# Patient Record
Sex: Male | Born: 1950 | Race: White | Hispanic: No | Marital: Married | Smoking: Current every day smoker
Health system: Southern US, Community
[De-identification: ages and names within clinical notes are randomized; demographics above are authoritative.]

## PROBLEM LIST (undated history)

## (undated) DIAGNOSIS — I10 Essential (primary) hypertension: Secondary | ICD-10-CM

## (undated) DIAGNOSIS — N400 Enlarged prostate without lower urinary tract symptoms: Secondary | ICD-10-CM

## (undated) DIAGNOSIS — B3749 Other urogenital candidiasis: Secondary | ICD-10-CM

## (undated) DIAGNOSIS — F03918 Unspecified dementia, unspecified severity, with other behavioral disturbance: Secondary | ICD-10-CM

## (undated) DIAGNOSIS — F0391 Unspecified dementia with behavioral disturbance: Secondary | ICD-10-CM

## (undated) DIAGNOSIS — Z720 Tobacco use: Secondary | ICD-10-CM

## (undated) DIAGNOSIS — M1990 Unspecified osteoarthritis, unspecified site: Secondary | ICD-10-CM

## (undated) DIAGNOSIS — E785 Hyperlipidemia, unspecified: Secondary | ICD-10-CM

## (undated) DIAGNOSIS — G35 Multiple sclerosis: Secondary | ICD-10-CM

## (undated) DIAGNOSIS — I714 Abdominal aortic aneurysm, without rupture, unspecified: Secondary | ICD-10-CM

## (undated) DIAGNOSIS — H543 Unqualified visual loss, both eyes: Secondary | ICD-10-CM

## (undated) DIAGNOSIS — Z8781 Personal history of (healed) traumatic fracture: Secondary | ICD-10-CM

## (undated) DIAGNOSIS — I7090 Unspecified atherosclerosis: Secondary | ICD-10-CM

## (undated) HISTORY — DX: Unspecified osteoarthritis, unspecified site: M19.90

## (undated) HISTORY — DX: Multiple sclerosis: G35

## (undated) HISTORY — DX: Hyperlipidemia, unspecified: E78.5

## (undated) HISTORY — DX: Abdominal aortic aneurysm, without rupture, unspecified: I71.40

## (undated) HISTORY — DX: Unspecified dementia, unspecified severity, with other behavioral disturbance: F03.918

## (undated) HISTORY — PX: OTHER SURGICAL HISTORY: SHX169

## (undated) HISTORY — DX: Unqualified visual loss, both eyes: H54.3

## (undated) HISTORY — DX: Abdominal aortic aneurysm, without rupture: I71.4

## (undated) HISTORY — DX: Other urogenital candidiasis: B37.49

## (undated) HISTORY — DX: Unspecified atherosclerosis: I70.90

## (undated) HISTORY — DX: Unspecified dementia with behavioral disturbance: F03.91

## (undated) HISTORY — DX: Essential (primary) hypertension: I10

## (undated) HISTORY — DX: Personal history of (healed) traumatic fracture: Z87.81

## (undated) HISTORY — DX: Tobacco use: Z72.0

## (undated) HISTORY — DX: Benign prostatic hyperplasia without lower urinary tract symptoms: N40.0

---

## 1972-05-23 HISTORY — PX: OTHER SURGICAL HISTORY: SHX169

## 1996-05-23 HISTORY — PX: OTHER SURGICAL HISTORY: SHX169

## 2005-05-05 ENCOUNTER — Encounter: Admission: RE | Admit: 2005-05-05 | Discharge: 2005-05-05 | Payer: Self-pay | Admitting: *Deleted

## 2011-06-06 DIAGNOSIS — R634 Abnormal weight loss: Secondary | ICD-10-CM | POA: Diagnosis not present

## 2011-06-06 DIAGNOSIS — E785 Hyperlipidemia, unspecified: Secondary | ICD-10-CM | POA: Diagnosis not present

## 2011-06-06 DIAGNOSIS — I10 Essential (primary) hypertension: Secondary | ICD-10-CM | POA: Diagnosis not present

## 2011-06-09 ENCOUNTER — Encounter: Payer: Self-pay | Admitting: Gastroenterology

## 2011-06-09 DIAGNOSIS — Z0000 Encounter for general adult medical examination without abnormal findings: Secondary | ICD-10-CM | POA: Diagnosis not present

## 2011-06-16 ENCOUNTER — Ambulatory Visit (AMBULATORY_SURGERY_CENTER): Payer: Medicare Other | Admitting: *Deleted

## 2011-06-16 VITALS — Ht 66.5 in | Wt 145.1 lb

## 2011-06-16 DIAGNOSIS — Z1211 Encounter for screening for malignant neoplasm of colon: Secondary | ICD-10-CM | POA: Diagnosis not present

## 2011-06-16 MED ORDER — PEG-KCL-NACL-NASULF-NA ASC-C 100 G PO SOLR: ORAL | Status: DC

## 2011-06-16 NOTE — Progress Notes (Signed)
Pt is blind.  Wife is with pt and reviewed all paperwork with pt before pt signed papers.  Ezra Sites

## 2011-06-29 ENCOUNTER — Encounter: Payer: Self-pay | Admitting: Gastroenterology

## 2011-06-29 ENCOUNTER — Ambulatory Visit (AMBULATORY_SURGERY_CENTER): Payer: Medicare Other | Admitting: Gastroenterology

## 2011-06-29 DIAGNOSIS — Z1211 Encounter for screening for malignant neoplasm of colon: Secondary | ICD-10-CM

## 2011-06-29 DIAGNOSIS — D126 Benign neoplasm of colon, unspecified: Secondary | ICD-10-CM

## 2011-06-29 MED ORDER — SODIUM CHLORIDE 0.9 % IV SOLN: 500.0000 mL | INTRAVENOUS | Status: DC

## 2011-06-29 NOTE — Progress Notes (Signed)
Patient did not experience any of the following events: a burn prior to discharge; a fall within the facility; wrong site/side/patient/procedure/implant event; or a hospital transfer or hospital admission upon discharge from the facility. (G8907) Patient did not have preoperative order for IV antibiotic SSI prophylaxis. (G8918)  

## 2011-06-29 NOTE — Patient Instructions (Signed)
Discharge instructions given with verbal understanding.  Handouts on polyps and hemorrhoids given.  Resume previous medications. 

## 2011-06-29 NOTE — Op Note (Signed)
[facility] [address] [place], [place]  [address]  COLONOSCOPY PROCEDURE REPORT  PATIENT:  Joe Dawson, Joe Dawson  MR#:  [id number] BIRTHDATE:  August 04, 1950, 60 yrs. old  GENDER:  male ENDOSCOPIST:  Judie Petit T. Russella Dar, MD, [facility] Referred by:  Bufford Spikes, D. O. PROCEDURE DATE:  06/29/2011 PROCEDURE:  Colonoscopy with snare polypectomy ASA CLASS:  Class II INDICATIONS:  1) Routine Risk Screening MEDICATIONS:   These medications were titrated to patient response per physician's verbal order, Fentanyl 100 mcg IV, Versed 10 mg IV DESCRIPTION OF PROCEDURE:   After the risks benefits and alternatives of the procedure were thoroughly explained, informed consent was obtained.  Digital rectal exam was performed and revealed no abnormalities.   The LB 180AL [id number] endoscope was introduced through the anus and advanced to the cecum, which was identified by both the appendix and ileocecal valve, limited by a tortuous and redundant colon, fair prep.    The quality of the prep was Moviprep fair.  The instrument was then slowly withdrawn as the colon was fully examined. <<PROCEDUREIMAGES>> FINDINGS:  Four polyps were found in the sigmoid colon. They were 5 - 7 mm in size. Polyps were snared without cautery. Retrieval was successful. Otherwise normal colonoscopy without other polyps, masses, vascular ectasias, or inflammatory changes. Retroflexed views in the rectum revealed internal hemorrhoids, moderate.  The time to cecum =  12.25  minutes. The scope was then withdrawn (time =  17.33  min) from the patient and the procedure completed.  COMPLICATIONS:  None  ENDOSCOPIC IMPRESSION: 1) 5 - 7 mm Four polyps in the sigmoid colon 2) Internal hemorrhoids  RECOMMENDATIONS: 1) Await pathology results 2) If 3 or 4 are adenomatous polyps, repeat colonoscopy in 3 years. If 1-2 are adenomatous, repeat colonoscopy in 5 years. Otherwise colonoscopy, VC or BE in 7 years given fair prep, tortuous and  redundant colon with a more extensive bowel prep.  Venita Lick. Russella Dar, MD, [facility]  n. eSIGNED:   Venita Lick. Feather Berrie at 06/29/2011 10:48 AM  Laurena Spies, [phone]

## 2011-06-30 ENCOUNTER — Telehealth: Payer: Self-pay | Admitting: *Deleted

## 2011-06-30 NOTE — Telephone Encounter (Signed)
Pt. Did not answer phone, left message

## 2011-07-04 ENCOUNTER — Encounter: Payer: Self-pay | Admitting: Gastroenterology

## 2011-07-18 ENCOUNTER — Other Ambulatory Visit: Payer: Self-pay | Admitting: Internal Medicine

## 2011-07-18 ENCOUNTER — Ambulatory Visit
Admission: RE | Admit: 2011-07-18 | Discharge: 2011-07-18 | Disposition: A | Discharge status: Home / Self Care | Payer: Medicare Other | Source: Ambulatory Visit | Attending: Internal Medicine | Admitting: Internal Medicine

## 2011-07-18 DIAGNOSIS — M47817 Spondylosis without myelopathy or radiculopathy, lumbosacral region: Secondary | ICD-10-CM | POA: Diagnosis not present

## 2011-07-18 DIAGNOSIS — I10 Essential (primary) hypertension: Secondary | ICD-10-CM | POA: Diagnosis not present

## 2011-07-18 DIAGNOSIS — M169 Osteoarthritis of hip, unspecified: Secondary | ICD-10-CM | POA: Diagnosis not present

## 2011-07-18 DIAGNOSIS — R52 Pain, unspecified: Secondary | ICD-10-CM

## 2011-07-18 DIAGNOSIS — M5137 Other intervertebral disc degeneration, lumbosacral region: Secondary | ICD-10-CM | POA: Diagnosis not present

## 2011-07-18 DIAGNOSIS — M25559 Pain in unspecified hip: Secondary | ICD-10-CM | POA: Diagnosis not present

## 2011-07-18 DIAGNOSIS — IMO0002 Reserved for concepts with insufficient information to code with codable children: Secondary | ICD-10-CM | POA: Diagnosis not present

## 2012-06-07 DIAGNOSIS — E785 Hyperlipidemia, unspecified: Secondary | ICD-10-CM | POA: Diagnosis not present

## 2012-06-07 DIAGNOSIS — I10 Essential (primary) hypertension: Secondary | ICD-10-CM | POA: Diagnosis not present

## 2012-06-07 DIAGNOSIS — F17200 Nicotine dependence, unspecified, uncomplicated: Secondary | ICD-10-CM | POA: Diagnosis not present

## 2012-06-07 DIAGNOSIS — N400 Enlarged prostate without lower urinary tract symptoms: Secondary | ICD-10-CM | POA: Diagnosis not present

## 2012-06-07 DIAGNOSIS — H4720 Unspecified optic atrophy: Secondary | ICD-10-CM | POA: Diagnosis not present

## 2012-06-11 DIAGNOSIS — F17200 Nicotine dependence, unspecified, uncomplicated: Secondary | ICD-10-CM | POA: Diagnosis not present

## 2012-06-11 DIAGNOSIS — E785 Hyperlipidemia, unspecified: Secondary | ICD-10-CM | POA: Diagnosis not present

## 2012-06-11 DIAGNOSIS — I10 Essential (primary) hypertension: Secondary | ICD-10-CM | POA: Diagnosis not present

## 2012-06-11 DIAGNOSIS — N400 Enlarged prostate without lower urinary tract symptoms: Secondary | ICD-10-CM | POA: Diagnosis not present

## 2012-10-24 ENCOUNTER — Other Ambulatory Visit: Payer: Self-pay

## 2013-04-01 ENCOUNTER — Other Ambulatory Visit: Payer: Self-pay | Admitting: Internal Medicine

## 2013-04-01 DIAGNOSIS — H547 Unspecified visual loss: Secondary | ICD-10-CM

## 2013-04-01 DIAGNOSIS — N400 Enlarged prostate without lower urinary tract symptoms: Secondary | ICD-10-CM

## 2013-04-01 DIAGNOSIS — G35 Multiple sclerosis: Secondary | ICD-10-CM

## 2013-04-01 DIAGNOSIS — E559 Vitamin D deficiency, unspecified: Secondary | ICD-10-CM

## 2013-04-01 DIAGNOSIS — E785 Hyperlipidemia, unspecified: Secondary | ICD-10-CM

## 2013-04-01 DIAGNOSIS — R739 Hyperglycemia, unspecified: Secondary | ICD-10-CM

## 2013-08-07 ENCOUNTER — Other Ambulatory Visit: Payer: Medicare Other

## 2013-08-07 DIAGNOSIS — R739 Hyperglycemia, unspecified: Secondary | ICD-10-CM

## 2013-08-07 DIAGNOSIS — E785 Hyperlipidemia, unspecified: Secondary | ICD-10-CM | POA: Diagnosis not present

## 2013-08-07 DIAGNOSIS — R7309 Other abnormal glucose: Secondary | ICD-10-CM | POA: Diagnosis not present

## 2013-08-07 DIAGNOSIS — N400 Enlarged prostate without lower urinary tract symptoms: Secondary | ICD-10-CM | POA: Diagnosis not present

## 2013-08-07 DIAGNOSIS — E559 Vitamin D deficiency, unspecified: Secondary | ICD-10-CM | POA: Diagnosis not present

## 2013-08-07 DIAGNOSIS — G35 Multiple sclerosis: Secondary | ICD-10-CM

## 2013-08-08 LAB — COMPREHENSIVE METABOLIC PANEL
ALT: 19 IU/L (ref 0–44)
AST: 25 IU/L (ref 0–40)
Albumin/Globulin Ratio: 1.8 (ref 1.1–2.5)
Albumin: 4.7 g/dL (ref 3.6–4.8)
Alkaline Phosphatase: 60 IU/L (ref 39–117)
BUN/Creatinine Ratio: 12 (ref 10–22)
BUN: 8 mg/dL (ref 8–27)
CO2: 20 mmol/L (ref 18–29)
Calcium: 9.4 mg/dL (ref 8.6–10.2)
Chloride: 96 mmol/L — ABNORMAL LOW (ref 97–108)
Creatinine, Ser: 0.68 mg/dL — ABNORMAL LOW (ref 0.76–1.27)
GFR calc Af Amer: 117 mL/min/{1.73_m2} (ref >59)
GFR calc non Af Amer: 101 mL/min/{1.73_m2} (ref >59)
Globulin, Total: 2.6 g/dL (ref 1.5–4.5)
Glucose: 96 mg/dL (ref 65–99)
Potassium: 4.2 mmol/L (ref 3.5–5.2)
Sodium: 136 mmol/L (ref 134–144)
Total Bilirubin: 1 mg/dL (ref 0.0–1.2)
Total Protein: 7.3 g/dL (ref 6.0–8.5)

## 2013-08-08 LAB — LIPID PANEL
Chol/HDL Ratio: 3.5 ratio units (ref 0.0–5.0)
Cholesterol, Total: 215 mg/dL — ABNORMAL HIGH (ref 100–199)
HDL: 61 mg/dL (ref >39)
LDL Calculated: 134 mg/dL — ABNORMAL HIGH (ref 0–99)
Triglycerides: 99 mg/dL (ref 0–149)
VLDL Cholesterol Cal: 20 mg/dL (ref 5–40)

## 2013-08-08 LAB — CBC WITH DIFFERENTIAL/PLATELET
Basophils Absolute: 0.1 10*3/uL (ref 0.0–0.2)
Basos: 1 %
Eos: 6 %
Eosinophils Absolute: 0.5 10*3/uL — ABNORMAL HIGH (ref 0.0–0.4)
HCT: 45.1 % (ref 37.5–51.0)
Hemoglobin: 15.5 g/dL (ref 12.6–17.7)
Immature Grans (Abs): 0 10*3/uL (ref 0.0–0.1)
Immature Granulocytes: 0 %
Lymphocytes Absolute: 3.3 10*3/uL — ABNORMAL HIGH (ref 0.7–3.1)
Lymphs: 38 %
MCH: 34.4 pg — ABNORMAL HIGH (ref 26.6–33.0)
MCHC: 34.4 g/dL (ref 31.5–35.7)
MCV: 100 fL — ABNORMAL HIGH (ref 79–97)
Monocytes Absolute: 1 10*3/uL — ABNORMAL HIGH (ref 0.1–0.9)
Monocytes: 12 %
Neutrophils Absolute: 3.7 10*3/uL (ref 1.4–7.0)
Neutrophils Relative %: 43 %
RBC: 4.5 x10E6/uL (ref 4.14–5.80)
RDW: 13.5 % (ref 12.3–15.4)
WBC: 8.6 10*3/uL (ref 3.4–10.8)

## 2013-08-08 LAB — HEMOGLOBIN A1C
Est. average glucose Bld gHb Est-mCnc: 108 mg/dL
Hgb A1c MFr Bld: 5.4 % (ref 4.8–5.6)

## 2013-08-08 LAB — VITAMIN D 25 HYDROXY (VIT D DEFICIENCY, FRACTURES): Vit D, 25-Hydroxy: 23.6 ng/mL — ABNORMAL LOW (ref 30.0–100.0)

## 2013-08-09 ENCOUNTER — Encounter: Payer: Self-pay | Admitting: Internal Medicine

## 2013-08-09 ENCOUNTER — Ambulatory Visit (INDEPENDENT_AMBULATORY_CARE_PROVIDER_SITE_OTHER): Payer: Medicare Other | Admitting: Internal Medicine

## 2013-08-09 VITALS — BP 110/68 | HR 58 | Temp 97.8°F | Resp 10 | Ht 66.5 in | Wt 140.0 lb

## 2013-08-09 DIAGNOSIS — H547 Unspecified visual loss: Secondary | ICD-10-CM

## 2013-08-09 DIAGNOSIS — H543 Unqualified visual loss, both eyes: Secondary | ICD-10-CM

## 2013-08-09 DIAGNOSIS — R739 Hyperglycemia, unspecified: Secondary | ICD-10-CM

## 2013-08-09 DIAGNOSIS — F17200 Nicotine dependence, unspecified, uncomplicated: Secondary | ICD-10-CM

## 2013-08-09 DIAGNOSIS — Z720 Tobacco use: Secondary | ICD-10-CM

## 2013-08-09 DIAGNOSIS — E785 Hyperlipidemia, unspecified: Secondary | ICD-10-CM | POA: Insufficient documentation

## 2013-08-09 DIAGNOSIS — N400 Enlarged prostate without lower urinary tract symptoms: Secondary | ICD-10-CM

## 2013-08-09 DIAGNOSIS — Z7189 Other specified counseling: Secondary | ICD-10-CM

## 2013-08-09 DIAGNOSIS — G35 Multiple sclerosis: Secondary | ICD-10-CM | POA: Insufficient documentation

## 2013-08-09 DIAGNOSIS — E559 Vitamin D deficiency, unspecified: Secondary | ICD-10-CM | POA: Insufficient documentation

## 2013-08-09 DIAGNOSIS — Z716 Tobacco abuse counseling: Secondary | ICD-10-CM

## 2013-08-09 DIAGNOSIS — R7309 Other abnormal glucose: Secondary | ICD-10-CM

## 2013-08-09 MED ORDER — VITAMIN D3 50 MCG (2000 UT) PO TABS: 2000.0000 [IU] | ORAL_TABLET | Freq: Every day | ORAL | Status: DC

## 2013-08-09 NOTE — Progress Notes (Signed)
Patient ID: Joe Dawson, male   DOB: Oct 04, 1950, 63 y.o.   MRN: [id number]   Location:  [facility]   Allergies  Allergen Reactions  . Epinephrine     Legs shake    Chief Complaint  Patient presents with  . Annual Exam    Yearly check-up, discuss labs (copy printed)     HPI: Patient is a 63 y.o. white male who is blind in both eyes and has arthritis was seen in the office today for his annual physical.  He refuses all vaccines.  He is still not ready to quit smoking.  Holding at 1/2 ppd.  Is trying to cut down some.  Has been down to 5 cigarettes at one point.  Knows he does not get enough exercise so he has sore muscles.  Tries to walk and ride his bike daily.    Has to work really hard to control his urine.  Using saw palmetto.  Increases them by 1-2 if it gets worse--takes 2-3 per day anyway.  No dysuria.  Has pressing need to run to the bathroom.  Will think about medication for this--refuses to see me but once a year.    Has lost 5 lbs, but does exercise.  Weighs himself about 2x per month.  Fluctuates b/w 135-140lbs.    Review of Systems:  Review of Systems  Constitutional: Negative for fever, chills and malaise/fatigue.  HENT: Negative for congestion.   Eyes:       Blind  Respiratory: Negative for cough and shortness of breath.   Cardiovascular: Negative for chest pain and palpitations.  Gastrointestinal: Negative for heartburn.  Genitourinary: Negative for dysuria.  Musculoskeletal: Negative for falls and myalgias.  Skin: Negative for rash.  Neurological: Negative for dizziness, loss of consciousness and headaches.       Unsteady gait, uses his guide stick  Endo/Heme/Allergies: Bruises/bleeds easily.  Psychiatric/Behavioral: Negative for depression and memory loss.    Past Medical History  Diagnosis Date  . Blindness - both eyes   . Arthritis     Past Surgical History  Procedure Laterality Date  . Bilateral inguinal  hernia  1974    Social History:   reports that he has been smoking Cigarettes.  He has been smoking about 0.50 packs per day. He has never used smokeless tobacco. He reports that he drinks about 16.8 ounces of alcohol per week. He reports that he does not use illicit drugs.  Family History  Problem Relation Age of Onset  . Colon cancer Neg Hx   . Stomach cancer Neg Hx     Medications: Patient's Medications  New Prescriptions   No medications on file  Previous Medications   ASPIRIN 81 MG TABLET    Take 81 mg by mouth 2 (two) times daily.   B COMPLEX VITAMINS (B COMPLEX 1 PO)    Take by mouth daily.   CHOLECALCIFEROL (VITAMIN D) 1000 UNITS TABLET    Take 1,000 Units by mouth daily.   MAGNESIUM 30 MG TABLET    Take 30 mg by mouth. 1 by mouth 3 x weekly   MULTIPLE VITAMINS-MINERALS (ECHINACEA ACZ PO)    Take by mouth 3 (three) times a week.   OMEGA-3 FATTY ACIDS (FISH OIL) 1000 MG CAPS    Take by mouth daily.   SAW PALMETTO 500 MG CAPSULE    Take 500 mg by mouth 2 (two) times daily.  Modified Medications   No medications on  file  Discontinued Medications   PEG 3350 POWDER (MOVIPREP) 100 G SOLR    moviprep as directed   Physical Exam: Filed Vitals:   08/09/13 0857  BP: 110/68  Pulse: 58  Temp: 97.8 F (36.6 C)  TempSrc: Oral  Resp: 10  Height: 5' 6.5" (1.689 m)  Weight: 140 lb (63.504 kg)  SpO2: 98%  Physical Exam  Constitutional: He is oriented to person, place, and time. No distress.  Thin white male  HENT:  Head: Normocephalic and atraumatic.  Eyes:  Blind, wears sunglasses  Neck: Normal range of motion. Neck supple. No JVD present.  Cardiovascular: Normal rate, regular rhythm, normal heart sounds and intact distal pulses.   Pulmonary/Chest: Effort normal and breath sounds normal. No respiratory distress.  Abdominal: Soft. Bowel sounds are normal. He exhibits no distension and no mass. There is no tenderness.  Genitourinary: Guaiac negative stool.  No suprapubic  tenderness  Musculoskeletal: Normal range of motion.  Neurological: He is alert and oriented to person, place, and time.  Psychiatric: He has a normal mood and affect.     Labs reviewed: Basic Metabolic Panel:  Recent Labs  08/07/13 0817  NA 136  K 4.2  CL 96*  CO2 20  GLUCOSE 96  BUN 8  CREATININE 0.68*  CALCIUM 9.4   Liver Function Tests:  Recent Labs  08/07/13 0817  AST 25  ALT 19  ALKPHOS 60  BILITOT 1.0  PROT 7.3  CBC:  Recent Labs  08/07/13 0817  WBC 8.6  NEUTROABS 3.7  HGB 15.5  HCT 45.1  MCV 100*   Lipid Panel:  Recent Labs  08/07/13 0817  HDL 61  LDLCALC 134*  TRIG 99  CHOLHDL 3.5   Lab Results  Component Value Date   HGBA1C 5.4 08/07/2013   Assessment/Plan 1. Hyperlipidemia LDL goal < 100 -LDL remains elevated and pt refuses to take statins, will try red rice yeast for him as he is agreeable to this--if no benefit on next labs, would stop it - Comprehensive metabolic panel; Future - Lipid panel; Future  2. Blind - refuses any additional f/u with ophthalmology and has been told there was not much that could be done - CBC With differential/Platelet; Future  3. Vitamin D insufficiency -increase vitamin D repletion to 2000 units daily  4. Benign prostatic hypertrophy -urinary urgency is getting worse--continues saw palmetto use which he says helps  5. Multiple sclerosis - no recent relapsing - CBC With differential/Platelet; Future  6. Hyperglycemia - f/u on labs - Comprehensive metabolic panel; Future  7. Tobacco abuse -is trying to taper off, not ready to quit altogether  8. Tobacco abuse counseling - counseled extensively on this  Labs/tests ordered: Orders Placed This Encounter  Procedures  . Comprehensive metabolic panel    Standing Status: Future     Number of Occurrences:      Standing Expiration Date: 08/10/2015  . Lipid panel    Standing Status: Future     Number of Occurrences:      Standing Expiration  Date: 08/10/2015  . CBC With differential/Platelet    Standing Status: Future     Number of Occurrences:      Standing Expiration Date: 08/10/2015   Next appt:  1 year (won't come sooner)

## 2013-08-09 NOTE — Patient Instructions (Addendum)
Increase your vitamin D to 2000 units daily (you may take 2 of the ones you have)  Try red rice yeast over the counter for your cholesterol.    Cont your exercise.  We talked about low fat foods today  Cont to cut back on your cigarettes.  You can get your toenails cut at [place].

## 2014-01-23 ENCOUNTER — Encounter: Payer: Self-pay | Admitting: Internal Medicine

## 2014-01-23 ENCOUNTER — Ambulatory Visit (INDEPENDENT_AMBULATORY_CARE_PROVIDER_SITE_OTHER): Payer: Medicare Other | Admitting: Internal Medicine

## 2014-01-23 VITALS — BP 180/100 | HR 74 | Temp 98.2°F | Resp 18 | Ht 66.5 in | Wt 139.6 lb

## 2014-01-23 DIAGNOSIS — F17200 Nicotine dependence, unspecified, uncomplicated: Secondary | ICD-10-CM

## 2014-01-23 DIAGNOSIS — H547 Unspecified visual loss: Secondary | ICD-10-CM

## 2014-01-23 DIAGNOSIS — Z720 Tobacco use: Secondary | ICD-10-CM

## 2014-01-23 DIAGNOSIS — H4720 Unspecified optic atrophy: Secondary | ICD-10-CM

## 2014-01-23 DIAGNOSIS — R7309 Other abnormal glucose: Secondary | ICD-10-CM | POA: Diagnosis not present

## 2014-01-23 DIAGNOSIS — N529 Male erectile dysfunction, unspecified: Secondary | ICD-10-CM

## 2014-01-23 DIAGNOSIS — E785 Hyperlipidemia, unspecified: Secondary | ICD-10-CM | POA: Diagnosis not present

## 2014-01-23 DIAGNOSIS — N400 Enlarged prostate without lower urinary tract symptoms: Secondary | ICD-10-CM | POA: Diagnosis not present

## 2014-01-23 DIAGNOSIS — F17209 Nicotine dependence, unspecified, with unspecified nicotine-induced disorders: Secondary | ICD-10-CM

## 2014-01-23 DIAGNOSIS — R739 Hyperglycemia, unspecified: Secondary | ICD-10-CM

## 2014-01-23 DIAGNOSIS — Z7189 Other specified counseling: Secondary | ICD-10-CM

## 2014-01-23 DIAGNOSIS — H543 Unqualified visual loss, both eyes: Secondary | ICD-10-CM

## 2014-01-23 DIAGNOSIS — Z716 Tobacco abuse counseling: Secondary | ICD-10-CM

## 2014-01-23 MED ORDER — BUPROPION HCL ER (SR) 150 MG PO TB12
150.0000 mg | ORAL_TABLET | Freq: Two times a day (BID) | ORAL | Status: DC
Start: 2014-01-23 — End: 2014-11-21

## 2014-01-23 NOTE — Progress Notes (Signed)
Patient ID: Joe Dawson, male   DOB: 06-Mar-1951, 63 y.o.   MRN: [id number]   Location:  [facility]   Allergies  Allergen Reactions  . Epinephrine     Legs shake    Chief Complaint  Patient presents with  . Medical Management of Chronic Issues    ? MS, Cialis/Viagra,     HPI: Patient is a 63 y.o. white male seen in the office today for medical mgt chronic diseases.    Says his sexual performance is not what he'd like it to be.  Wonders if he could take viagra or cialis for this.  Has difficulty getting and keeping an erection.  Discussed that smoking is affecting his circulation to that area as well.  Does take his aspirin 81 mg.    BP elevated today, but just smoked cigarette before coming in.  Also is nervous.  Continues to smoke.  Cannot quit thus far.  Smokes a pack over 2-3 days.  Has cut back dramatically.  Is coughing more lately and bringing up more mucus.  That worries him.  Is not yet ready to try patches.  Lacks motivation.  His wife buys them and does ration them--no more than 3 packs per week with some left at the end.      Has white coat hypertension.  Review of Systems:  Review of Systems  Constitutional: Negative for fever and malaise/fatigue.  HENT: Negative for congestion.   Eyes: Negative for blurred vision.  Respiratory: Positive for cough and wheezing. Negative for shortness of breath.   Cardiovascular: Negative for chest pain.  Gastrointestinal: Negative for heartburn and abdominal pain.  Genitourinary: Negative for dysuria, urgency and frequency.  Musculoskeletal: Negative for falls.  Skin: Negative for rash.  Neurological: Negative for dizziness and weakness.  Psychiatric/Behavioral: Negative for depression and memory loss.     Past Medical History  Diagnosis Date  . Blindness - both eyes   . Arthritis     Past Surgical History  Procedure Laterality Date  . Bilateral inguinal hernia  1974     Social History:   reports that he has been smoking Cigarettes.  He has been smoking about 0.50 packs per day. He has never used smokeless tobacco. He reports that he drinks about 16.8 ounces of alcohol per week. He reports that he does not use illicit drugs.  Family History  Problem Relation Age of Onset  . Colon cancer Neg Hx   . Stomach cancer Neg Hx     Medications: Patient's Medications  New Prescriptions   No medications on file  Previous Medications   ASPIRIN 81 MG TABLET    Take 81 mg by mouth 2 (two) times daily.   B COMPLEX VITAMINS (B COMPLEX 1 PO)    Take by mouth daily.   CHOLECALCIFEROL 2000 UNITS TABS    Take 2,000 Units by mouth daily.   MAGNESIUM 30 MG TABLET    Take 30 mg by mouth. 1 by mouth 3 x weekly   MULTIPLE VITAMINS-MINERALS (ECHINACEA ACZ PO)    Take by mouth 3 (three) times a week.   OMEGA-3 FATTY ACIDS (FISH OIL) 1000 MG CAPS    Take by mouth daily.   SAW PALMETTO 500 MG CAPSULE    Take 500 mg by mouth 2 (two) times daily.  Modified Medications   No medications on file  Discontinued Medications   No medications on file     Physical Exam: Danley Danker  Vitals:   01/23/14 1003  BP: 140/100  Pulse: 74  Temp: 98.2 F (36.8 C)  TempSrc: Oral  Resp: 18  Height: 5' 6.5" (1.689 m)  Weight: 139 lb 9.6 oz (63.322 kg)  SpO2: 98%  Physical Exam  Constitutional: He is oriented to person, place, and time.  Thin white male  HENT:  Head: Normocephalic and atraumatic.  Eyes:  Blind, uses walking stick  Cardiovascular: Normal rate, regular rhythm, normal heart sounds and intact distal pulses.   Pulmonary/Chest: Effort normal. He has wheezes.  Expiratory greater on left than right  Abdominal: Soft. Bowel sounds are normal. He exhibits no distension and no mass. There is no tenderness.  Musculoskeletal: Normal range of motion.  Neurological: He is alert and oriented to person, place, and time.  Skin: Skin is warm and dry. There is pallor.  Psychiatric: He  has a normal mood and affect.    Labs reviewed: Basic Metabolic Panel:  Recent Labs  08/07/13 0817  NA 136  K 4.2  CL 96*  CO2 20  GLUCOSE 96  BUN 8  CREATININE 0.68*  CALCIUM 9.4   Liver Function Tests:  Recent Labs  08/07/13 0817  AST 25  ALT 19  ALKPHOS 60  BILITOT 1.0  PROT 7.3  CBC:  Recent Labs  08/07/13 0817  WBC 8.6  NEUTROABS 3.7  HGB 15.5  HCT 45.1  MCV 100*   Lipid Panel:  Recent Labs  08/07/13 0817  HDL 61  LDLCALC 134*  TRIG 99  CHOLHDL 3.5   Lab Results  Component Value Date   HGBA1C 5.4 08/07/2013     Assessment/Plan 1. 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 Benign prostatic hypertrophy -stable, no new symptoms  3. Tobacco abuse -continues, but interested in trying wellbutrin to help him quit--begun today  4. Hyperlipidemia LDL goal <100 -needs fasting lipids before annual exam  5. Hyperglycemia - f/u labs: - CBC With differential/Platelet - Comprehensive metabolic panel - Hemoglobin A1c  6. 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  Saw Dr. Ricki Miller  7. Tobacco use disorder, continuous -will try to quit using wellbutrin  8. Tobacco abuse counseling -counseled extensively today for 15 mins about this due to new cough and congestion -will start wellbutrin and must pick date to quit  9. Erectile dysfunction, unspecified erectile dysfunction type -given viagra samples for 50mg  x 4 pills -advised if this does not work, it is probably due to poor circulation to his penis due to smoking and cholesterol build up  Labs/tests ordered:   Orders Placed This Encounter  Procedures  . CBC With differential/Platelet  . Comprehensive metabolic panel  . Hemoglobin A1c    Next appt:  6 mos for annual exam with fasting lipids

## 2014-01-24 ENCOUNTER — Encounter: Payer: Self-pay | Admitting: *Deleted

## 2014-01-24 ENCOUNTER — Telehealth: Payer: Self-pay | Admitting: *Deleted

## 2014-01-24 LAB — COMPREHENSIVE METABOLIC PANEL
ALT: 17 IU/L (ref 0–44)
AST: 22 IU/L (ref 0–40)
Albumin/Globulin Ratio: 2 (ref 1.1–2.5)
Albumin: 4.7 g/dL (ref 3.6–4.8)
Alkaline Phosphatase: 56 IU/L (ref 39–117)
BUN/Creatinine Ratio: 13 (ref 10–22)
BUN: 8 mg/dL (ref 8–27)
CO2: 20 mmol/L (ref 18–29)
Calcium: 9.3 mg/dL (ref 8.6–10.2)
Chloride: 99 mmol/L (ref 97–108)
Creatinine, Ser: 0.6 mg/dL — ABNORMAL LOW (ref 0.76–1.27)
GFR calc Af Amer: 123 mL/min/{1.73_m2} (ref >59)
GFR calc non Af Amer: 106 mL/min/{1.73_m2} (ref >59)
Globulin, Total: 2.3 g/dL (ref 1.5–4.5)
Glucose: 90 mg/dL (ref 65–99)
Potassium: 4.4 mmol/L (ref 3.5–5.2)
Sodium: 137 mmol/L (ref 134–144)
Total Bilirubin: 0.4 mg/dL (ref 0.0–1.2)
Total Protein: 7 g/dL (ref 6.0–8.5)

## 2014-01-24 LAB — CBC WITH DIFFERENTIAL
Basophils Absolute: 0.1 10*3/uL (ref 0.0–0.2)
Basos: 1 %
Eos: 6 %
Eosinophils Absolute: 0.4 10*3/uL (ref 0.0–0.4)
HCT: 44.6 % (ref 37.5–51.0)
Hemoglobin: 15.9 g/dL (ref 12.6–17.7)
Immature Grans (Abs): 0 10*3/uL (ref 0.0–0.1)
Immature Granulocytes: 0 %
Lymphocytes Absolute: 2.4 10*3/uL (ref 0.7–3.1)
Lymphs: 33 %
MCH: 34.9 pg — ABNORMAL HIGH (ref 26.6–33.0)
MCHC: 35.7 g/dL (ref 31.5–35.7)
MCV: 98 fL — ABNORMAL HIGH (ref 79–97)
Monocytes Absolute: 0.9 10*3/uL (ref 0.1–0.9)
Monocytes: 12 %
Neutrophils Absolute: 3.6 10*3/uL (ref 1.4–7.0)
Neutrophils Relative %: 48 %
Platelets: 286 10*3/uL (ref 150–379)
RBC: 4.55 x10E6/uL (ref 4.14–5.80)
RDW: 13.3 % (ref 12.3–15.4)
WBC: 7.4 10*3/uL (ref 3.4–10.8)

## 2014-01-24 LAB — HEMOGLOBIN A1C
Est. average glucose Bld gHb Est-mCnc: 114 mg/dL
Hgb A1c MFr Bld: 5.6 % (ref 4.8–5.6)

## 2014-01-24 NOTE — Telephone Encounter (Signed)
Message copied by Eilene Ghazi on Fri Jan 24, 2014  3:18 PM ------      Message from: Gayland Curry      Created: Fri Jan 24, 2014  8:04 AM       Labs are excellent. I wish him luck with smoking cessation!  No changes needed. ------

## 2014-01-24 NOTE — Telephone Encounter (Signed)
Message copied by Eilene Ghazi on Fri Jan 24, 2014  3:29 PM ------      Message from: Gayland Curry      Created: Fri Jan 24, 2014  8:04 AM       Labs are excellent. I wish him luck with smoking cessation!  No changes needed. ------

## 2014-01-24 NOTE — Telephone Encounter (Signed)
Spoke with patient's wife regarding lab results, she stated that she understood and had no questions at this time.

## 2014-03-04 ENCOUNTER — Other Ambulatory Visit: Payer: Self-pay | Admitting: *Deleted

## 2014-03-04 MED ORDER — SILDENAFIL CITRATE 50 MG PO TABS: 50.0000 mg | ORAL_TABLET | Freq: Every day | ORAL | Status: DC | PRN

## 2014-03-04 NOTE — Telephone Encounter (Signed)
Patient received samples at [facility] and was told if they worked a Rx would be provided. Rx faxed to pharmacy

## 2014-08-13 ENCOUNTER — Other Ambulatory Visit: Payer: Medicare Other

## 2014-08-15 ENCOUNTER — Encounter: Payer: Medicare Other | Admitting: Internal Medicine

## 2014-08-28 ENCOUNTER — Encounter: Payer: Medicare Other | Admitting: Internal Medicine

## 2014-11-17 ENCOUNTER — Other Ambulatory Visit: Payer: Medicare Other

## 2014-11-17 DIAGNOSIS — E785 Hyperlipidemia, unspecified: Secondary | ICD-10-CM | POA: Diagnosis not present

## 2014-11-17 DIAGNOSIS — R739 Hyperglycemia, unspecified: Secondary | ICD-10-CM | POA: Diagnosis not present

## 2014-11-17 DIAGNOSIS — G35 Multiple sclerosis: Secondary | ICD-10-CM | POA: Diagnosis not present

## 2014-11-17 DIAGNOSIS — H540 Blindness, both eyes: Secondary | ICD-10-CM | POA: Diagnosis not present

## 2014-11-17 DIAGNOSIS — H547 Unspecified visual loss: Secondary | ICD-10-CM

## 2014-11-18 LAB — CBC WITH DIFFERENTIAL
Basophils Absolute: 0 10*3/uL (ref 0.0–0.2)
Basos: 1 %
EOS (ABSOLUTE): 0.3 10*3/uL (ref 0.0–0.4)
Eos: 5 %
Hematocrit: 42.7 % (ref 37.5–51.0)
Hemoglobin: 14.9 g/dL (ref 12.6–17.7)
Immature Grans (Abs): 0 10*3/uL (ref 0.0–0.1)
Immature Granulocytes: 0 %
Lymphocytes Absolute: 2.3 10*3/uL (ref 0.7–3.1)
Lymphs: 32 %
MCH: 33.5 pg — ABNORMAL HIGH (ref 26.6–33.0)
MCHC: 34.9 g/dL (ref 31.5–35.7)
MCV: 96 fL (ref 79–97)
Monocytes Absolute: 0.7 10*3/uL (ref 0.1–0.9)
Monocytes: 10 %
Neutrophils Absolute: 3.7 10*3/uL (ref 1.4–7.0)
Neutrophils: 52 %
RBC: 4.45 x10E6/uL (ref 4.14–5.80)
RDW: 13.8 % (ref 12.3–15.4)
WBC: 7.1 10*3/uL (ref 3.4–10.8)

## 2014-11-18 LAB — COMPREHENSIVE METABOLIC PANEL
ALT: 17 IU/L (ref 0–44)
AST: 21 IU/L (ref 0–40)
Albumin/Globulin Ratio: 1.5 (ref 1.1–2.5)
Albumin: 4.3 g/dL (ref 3.6–4.8)
Alkaline Phosphatase: 53 IU/L (ref 39–117)
BUN/Creatinine Ratio: 14 (ref 10–22)
BUN: 8 mg/dL (ref 8–27)
Bilirubin Total: 0.5 mg/dL (ref 0.0–1.2)
CO2: 21 mmol/L (ref 18–29)
Calcium: 8.7 mg/dL (ref 8.6–10.2)
Chloride: 100 mmol/L (ref 97–108)
Creatinine, Ser: 0.57 mg/dL — ABNORMAL LOW (ref 0.76–1.27)
GFR calc Af Amer: 125 mL/min/{1.73_m2} (ref >59)
GFR calc non Af Amer: 108 mL/min/{1.73_m2} (ref >59)
Globulin, Total: 2.8 g/dL (ref 1.5–4.5)
Glucose: 92 mg/dL (ref 65–99)
Potassium: 4.2 mmol/L (ref 3.5–5.2)
Sodium: 138 mmol/L (ref 134–144)
Total Protein: 7.1 g/dL (ref 6.0–8.5)

## 2014-11-18 LAB — LIPID PANEL
Chol/HDL Ratio: 4.8 ratio units (ref 0.0–5.0)
Cholesterol, Total: 197 mg/dL (ref 100–199)
HDL: 41 mg/dL (ref >39)
LDL Calculated: 131 mg/dL — ABNORMAL HIGH (ref 0–99)
Triglycerides: 123 mg/dL (ref 0–149)
VLDL Cholesterol Cal: 25 mg/dL (ref 5–40)

## 2014-11-21 ENCOUNTER — Encounter: Payer: Self-pay | Admitting: Internal Medicine

## 2014-11-21 ENCOUNTER — Ambulatory Visit (INDEPENDENT_AMBULATORY_CARE_PROVIDER_SITE_OTHER): Payer: Medicare Other | Admitting: Internal Medicine

## 2014-11-21 VITALS — BP 148/88 | HR 72 | Temp 98.5°F | Resp 18 | Ht 67.0 in | Wt 137.2 lb

## 2014-11-21 DIAGNOSIS — Z720 Tobacco use: Secondary | ICD-10-CM

## 2014-11-21 DIAGNOSIS — Z0000 Encounter for general adult medical examination without abnormal findings: Secondary | ICD-10-CM

## 2014-11-21 DIAGNOSIS — H547 Unspecified visual loss: Secondary | ICD-10-CM

## 2014-11-21 DIAGNOSIS — N400 Enlarged prostate without lower urinary tract symptoms: Secondary | ICD-10-CM | POA: Diagnosis not present

## 2014-11-21 DIAGNOSIS — E785 Hyperlipidemia, unspecified: Secondary | ICD-10-CM | POA: Diagnosis not present

## 2014-11-21 DIAGNOSIS — M1611 Unilateral primary osteoarthritis, right hip: Secondary | ICD-10-CM

## 2014-11-21 DIAGNOSIS — I10 Essential (primary) hypertension: Secondary | ICD-10-CM

## 2014-11-21 DIAGNOSIS — H4720 Unspecified optic atrophy: Secondary | ICD-10-CM

## 2014-11-21 DIAGNOSIS — R2232 Localized swelling, mass and lump, left upper limb: Secondary | ICD-10-CM

## 2014-11-21 DIAGNOSIS — S76111S Strain of right quadriceps muscle, fascia and tendon, sequela: Secondary | ICD-10-CM

## 2014-11-21 DIAGNOSIS — R739 Hyperglycemia, unspecified: Secondary | ICD-10-CM

## 2014-11-21 DIAGNOSIS — H540 Blindness, both eyes: Secondary | ICD-10-CM

## 2014-11-21 NOTE — Progress Notes (Signed)
Patient ID: Joe Dawson, male   DOB: September 07, 1950, 64 y.o.   MRN: [id number]   Location:  [facility] / [facility]  Code Status: DNR Goals of Care: Advanced Directive information Does patient have an advance directive?: No, Would patient like information on creating an advanced directive?: Yes - Educational materials given   Chief Complaint  Patient presents with  . Annual Exam    pain in side and rt hip    HPI: Patient is a 64 y.o. white male seen in the office today for his annual exam.  He has pain in his side and his right hip.  He thinks it is related to his hip finally giving out on him.  Knows he has arthritis in his hip.  Uses his cane to walk around.  Hurts him most when he gets up--takes 15-30 mins maybe a half a day to get over.  Hard to take steps sometimes.  Takes his baby asa 81mg  only.  Says that "does him pretty good".    No falls lately he says.  Banged his nose on a table leg due to his blindness.  EKG was done for screening due to his htn and tobacco abuse, but was unremarkable.    Refuses all vaccinations as usual.    Viagra worked fairly well.    Wellbutrin made him groggy and sleepy.  Still smoking, but same amt to less than last appt.  Smokes a pack over 2-4 days.  Sometimes doesn't smoke at all all day long.  Admits it's shortening his breath somewhat.  Says he ain't been sick in 10 yrs.  Refuses pneumonia vaccines.    Unable to assess memory accurately with MMSE due to his blindness.  Denies depression on PHQ2 and denies falls.    Review of Systems:  Review of Systems  Constitutional: Negative for fever, chills and malaise/fatigue.  HENT: Positive for hearing loss. Negative for congestion.   Eyes: Negative for blurred vision.  Respiratory: Positive for shortness of breath. Negative for cough, sputum production and wheezing.   Cardiovascular: Negative for chest pain and leg swelling.  Gastrointestinal: Negative for abdominal  pain, constipation, blood in stool and melena.  Genitourinary: Negative for dysuria, urgency and frequency.  Musculoskeletal: Positive for myalgias, back pain and joint pain. Negative for falls.  Skin: Negative for rash.  Neurological: Negative for dizziness, loss of consciousness, weakness and headaches.       Balance poor  Endo/Heme/Allergies: Does not bruise/bleed easily.  Psychiatric/Behavioral: Positive for depression. Negative for suicidal ideas and memory loss. The patient does not have insomnia.     Past Medical History  Diagnosis Date  . Blindness - both eyes   . Arthritis   . Hypertension   . Hyperlipidemia   . Tobacco abuse   . BPH (benign prostatic hyperplasia)     Past Surgical History  Procedure Laterality Date  . Bilateral inguinal hernia  1974    Dr. Viona Gilmore  . Broken hand  1998  . Broken foot bone      Allergies  Allergen Reactions  . Epinephrine     Legs shake   Medications: Patient's Medications  New Prescriptions   No medications on file  Previous Medications   ASPIRIN 81 MG TABLET    Take 81 mg by mouth 2 (two) times daily.   B COMPLEX VITAMINS (B COMPLEX 1 PO)    Take by mouth daily.   CHOLECALCIFEROL 2000 UNITS TABS    Take  2,000 Units by mouth daily.   MAGNESIUM 30 MG TABLET    Take 30 mg by mouth. 1 by mouth 3 x weekly   MULTIPLE VITAMINS-MINERALS (ECHINACEA ACZ PO)    Take by mouth 3 (three) times a week.   OMEGA-3 FATTY ACIDS (FISH OIL) 1000 MG CAPS    Take by mouth daily.  Modified Medications   No medications on file  Discontinued Medications   BUPROPION (WELLBUTRIN SR) 150 MG 12 HR TABLET    Take 1 tablet (150 mg total) by mouth 2 (two) times daily. For smoking cessation   SAW PALMETTO 500 MG CAPSULE    Take 500 mg by mouth 2 (two) times daily.   SILDENAFIL (VIAGRA) 50 MG TABLET    Take 1 tablet (50 mg total) by mouth daily as needed for erectile dysfunction.    Physical Exam: Filed Vitals:   11/21/14 0855  BP: 148/100  Pulse: 72    Temp: 98.5 F (36.9 C)  TempSrc: Oral  Resp: 18  Height: 5\' 7"  (1.702 m)  Weight: 137 lb 3.2 oz (62.234 kg)  SpO2: 95%   Physical Exam  Constitutional: He is oriented to person, place, and time. No distress.  Frail appearing white male   HENT:  Head: Normocephalic and atraumatic.  Right Ear: External ear normal.  Left Ear: External ear normal.  Nose: Nose normal.  Mouth/Throat: Oropharynx is clear and moist.  Large amt of cerumen was manually extracted with a plastic loop from both of his ears  Eyes: Conjunctivae are normal. Pupils are equal, round, and reactive to light.  Neck: Neck supple. No JVD present. No thyromegaly present.  Cardiovascular: Normal rate, regular rhythm and intact distal pulses.   Pulmonary/Chest: Effort normal and breath sounds normal. He has no wheezes. He has no rales.  Abdominal: Soft. Bowel sounds are normal. He exhibits no distension. There is no tenderness.  Musculoskeletal: He exhibits tenderness.  Of right lateral thigh with bulging muscle present (said to be for years); right hip is tender over bursa and sacroiliac region of back;  Left second digit with large cystic mass with increased blood vessels present between in his interphalangeal joints   Lymphadenopathy:    He has no cervical adenopathy.  Neurological: He is alert and oriented to person, place, and time. He displays abnormal reflex. A cranial nerve deficit is present. He exhibits abnormal muscle tone. Coordination abnormal.  Optic nerve damage; also has hypereflexia left, twitching of muscles of thigh on right  Skin: Skin is warm and dry.  Psychiatric:  Flat affect, slow speech    Labs reviewed: Basic Metabolic Panel:  Recent Labs  01/23/14 1106 11/17/14 0837  NA 137 138  K 4.4 4.2  CL 99 100  CO2 20 21  GLUCOSE 90 92  BUN 8 8  CREATININE 0.60* 0.57*  CALCIUM 9.3 8.7   Liver Function Tests:  Recent Labs  01/23/14 1106 11/17/14 0837  AST 22 21  ALT 17 17  ALKPHOS 56  53  BILITOT 0.4 0.5  PROT 7.0 7.1   No results for input(s): LIPASE, AMYLASE in the last 8760 hours. No results for input(s): AMMONIA in the last 8760 hours. CBC:  Recent Labs  01/23/14 1106 11/17/14 0837  WBC 7.4 7.1  NEUTROABS 3.6 3.7  HGB 15.9  --   HCT 44.6 42.7  MCV 98*  --   PLT 286  --    Lipid Panel:  Recent Labs  11/17/14 0837  CHOL 197  HDL 41  LDLCALC 131*  TRIG 123  CHOLHDL 4.8   Lab Results  Component Value Date   HGBA1C 5.6 01/23/2014    Procedures since last visit: Reviewed 2013 xrays of right hip, pelvis and lumbar spine.  All showed arthritis and bone spurs.    Assessment/Plan 1. Encounter for Medicare annual wellness exam -denied depression on PHQ2, denies falls -memory assessment could not be done due to his blindness -refuses ALL vaccines, had cscope in 2014 -he does walk on the treadmill at home regularly -he still smokes and can't quit though he knows he should  2. Essential hypertension, benign -bp is elevated once again here -his wife says it's white coat hypertension--both of them seems resistant to treatments when they can avoid them -advised for her to check his bp at home for the next month and bring him back with a record of them  3. Optic nerve atrophy, bilateral -previously diagnosed by Dr. Guilford Shi request records from him  -I still suspect pt may have MS with his neurologic deficits and optic nerve damage, but he refuses further workup.  4. Benign prostatic hypertrophy -denies symptoms today   5. Tobacco abuse -ongoing--he is smoking <1/2ppd, but cannot quit -failed attempts with wellbutrin therapy and too depressed to be treated safely with chantix plus he doesn't want to take anything for depression  6. Hyperlipidemia LDL goal <100 -lipids elevated with LDL 130 -probably has CAD though EKG was ok today -he won't take any real meds for this either  7. Hyperglycemia -fasting glucose elevated historically on  labs, cont to monitor--9/15 was normal  8. 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 Rupture quadriceps tendon, sequela -tells me this occurred several years ago, but now his gait is becoming increasing impaired and he has lateral thigh pain  11. Primary osteoarthritis of right hip -imaged a few years ago with some degenerative changes, seems this is progressing and he's more unsteady with more pain--not using regular pain medication  Labs/tests ordered:   Orders Placed This Encounter  Procedures  . AMB referral to orthopedics    Referral Priority:  Routine    Referral Type:  Consultation    Number of Visits Requested:  1    Next appt:  1 month for bp check  Mala Gibbard L. Nasiya Pascual, D.O. [facility] [address], [place] [address] Cell Phone (Mon-Fri 8am-5pm):  [phone] On Call:  [phone] & follow prompts after 5pm & weekends Office Phone:  [phone] Office Fax:  [phone]

## 2014-11-21 NOTE — Patient Instructions (Signed)
Check blood pressure daily at home.  Call if readings are over 140/90.

## 2014-12-22 ENCOUNTER — Encounter: Payer: Self-pay | Admitting: Internal Medicine

## 2015-01-06 ENCOUNTER — Telehealth: Payer: Self-pay | Admitting: Internal Medicine

## 2015-01-06 NOTE — Telephone Encounter (Signed)
FYI - The othopedics office has called patient several times to schedule an appointment and I have called the patient several times as well, I also sent a letter to the patient to try to get this referral scheduled

## 2015-01-06 NOTE — Telephone Encounter (Signed)
His wife has been trying to convince him to go, but he is stubborn and will not agree to any help whatsoever.  Please inform them that he refuses to come.  Thanks.

## 2015-03-30 ENCOUNTER — Telehealth: Payer: Self-pay | Admitting: Internal Medicine

## 2015-03-30 NOTE — Telephone Encounter (Signed)
Called and spoke with Mr. Ibarra, he refused to schedule another appointment.  Informed Mr. Sovine he was to follow up in 1 month for his blood pressure, says he will tell me the trust " stated he will not be coming into the office" FYI to Dr. Mariea Clonts....cdavis

## 2015-03-30 NOTE — Telephone Encounter (Signed)
He typically says he will not come and his wife has to convince him to come in about once a year at which time he does not do anything I ask of him.

## 2016-03-04 ENCOUNTER — Telehealth: Payer: Self-pay | Admitting: Internal Medicine

## 2016-03-04 NOTE — Telephone Encounter (Signed)
left msg asking pt to call back and schedule AWV. VDM (dee-dee)

## 2016-03-17 NOTE — Telephone Encounter (Signed)
Left another message asking pt to call and schedule AWV. VDM (DD)

## 2016-04-26 ENCOUNTER — Telehealth: Payer: Self-pay | Admitting: Internal Medicine

## 2016-04-26 NOTE — Telephone Encounter (Signed)
left msg asking pt to confirm this AWV appt w/ nurse. VDM (DD) °

## 2016-05-09 ENCOUNTER — Ambulatory Visit (INDEPENDENT_AMBULATORY_CARE_PROVIDER_SITE_OTHER): Payer: Medicare Other

## 2016-05-09 VITALS — BP 170/88 | HR 74 | Temp 97.7°F | Ht 66.0 in | Wt 141.8 lb

## 2016-05-09 DIAGNOSIS — Z0000 Encounter for general adult medical examination without abnormal findings: Secondary | ICD-10-CM

## 2016-05-09 NOTE — Patient Instructions (Addendum)
Joe Dawson , Thank you for taking time to come for your Medicare Wellness Visit. I appreciate your ongoing commitment to your health goals. Please review the following plan we discussed and let me know if I can assist you in the future.   These are the goals we discussed: Goals    . Exercise 3x per week (30 min per time)          Starting 05/09/16, I will attempt to increase my physical activity.     . Quit smoking / using tobacco       This is a list of the screening recommended for you and due dates:  Health Maintenance  Topic Date Due  .  Hepatitis C: One time screening is recommended by Center for Disease Control  (CDC) for  adults born from [birth year] through 1966.   July 02, 1950  . Tetanus Vaccine  06/30/1968  . Shingles Vaccine  06/30/2009  . Pneumonia vaccines (1 of 2 - PCV13) 06/30/2014  . Flu Shot  12/22/2015  . Colon Cancer Screening  06/28/2018  Preventive Care for Adults  A healthy lifestyle and preventive care can promote health and wellness. Preventive health guidelines for adults include the following key practices.  . A routine yearly physical is a good way to check with your health care provider about your health and preventive screening. It is a chance to share any concerns and updates on your health and to receive a thorough exam.  . Visit your dentist for a routine exam and preventive care every 6 months. Brush your teeth twice a day and floss once a day. Good oral hygiene prevents tooth decay and gum disease.  . The frequency of eye exams is based on your age, health, family medical history, use  of contact lenses, and other factors. Follow your health care provider's ecommendations for frequency of eye exams.  . Eat a healthy diet. Foods like vegetables, fruits, whole grains, low-fat dairy products, and lean protein foods contain the nutrients you need without too many calories. Decrease your intake of foods high in solid fats, added sugars, and salt. Eat the right  amount of calories for you. Get information about a proper diet from your health care provider, if necessary.  . Regular physical exercise is one of the most important things you can do for your health. Most adults should get at least 150 minutes of moderate-intensity exercise (any activity that increases your heart rate and causes you to sweat) each week. In addition, most adults need muscle-strengthening exercises on 2 or more days a week.  Silver Sneakers may be a benefit available to you. To determine eligibility, you may visit the website: www.silversneakers.com or contact program at [phone] Mon-Fri between 8AM-8PM.   . Maintain a healthy weight. The body mass index (BMI) is a screening tool to identify possible weight problems. It provides an estimate of body fat based on height and weight. Your health care provider can find your BMI and can help you achieve or maintain a healthy weight.   For adults 20 years and older: ? A BMI below 18.5 is considered underweight. ? A BMI of 18.5 to 24.9 is normal. ? A BMI of 25 to 29.9 is considered overweight. ? A BMI of 30 and above is considered obese.   . Maintain normal blood lipids and cholesterol levels by exercising and minimizing your intake of saturated fat. Eat a balanced diet with plenty of fruit and vegetables. Blood tests for lipids and cholesterol should  begin at age 57 and be repeated every 5 years. If your lipid or cholesterol levels are high, you are over 50, or you are at high risk for heart disease, you may need your cholesterol levels checked more frequently. Ongoing high lipid and cholesterol levels should be treated with medicines if diet and exercise are not working.  . If you smoke, find out from your health care provider how to quit. If you do not use tobacco, please do not start.  . If you choose to drink alcohol, please do not consume more than 2 drinks per day. One drink is considered to be 12 ounces (355 mL) of beer, 5  ounces (148 mL) of wine, or 1.5 ounces (44 mL) of liquor.  . If you are 71-66 years old, ask your health care provider if you should take aspirin to prevent strokes.  . Use sunscreen. Apply sunscreen liberally and repeatedly throughout the day. You should seek shade when your shadow is shorter than you. Protect yourself by wearing long sleeves, pants, a wide-brimmed hat, and sunglasses year round, whenever you are outdoors.  . Once a month, do a whole body skin exam, using a mirror to look at the skin on your back. Tell your health care provider of new moles, moles that have irregular borders, moles that are larger than a pencil eraser, or moles that have changed in shape or color.

## 2016-05-09 NOTE — Progress Notes (Signed)
Subjective:   Joe Dawson is a 65 y.o. male who presents for Medicare Annual/Subsequent preventive examination.  Review of Systems:  Cardiac Risk Factors include: advanced age (>57men, >32 women);family history of premature cardiovascular disease;smoking/ tobacco exposure;sedentary lifestyle;male gender;hypertension     Objective:    Vitals: BP (!) 170/88 (BP Location: Right Arm, Patient Position: Sitting, Cuff Size: Normal) Comment: 160/88  Pulse 74   Temp 97.7 F (36.5 C) (Oral)   Ht 5\' 6"  (1.676 m)   Wt 141 lb 12.8 oz (64.3 kg)   SpO2 97%   BMI 22.89 kg/m   Body mass index is 22.89 kg/m.  Tobacco History  Smoking Status  . Current Every Day Smoker  . Packs/day: 0.50  . Types: Cigarettes  Smokeless Tobacco  . Never Used     Ready to quit: No Counseling given: No   Past Medical History:  Diagnosis Date  . Arthritis   . Blindness - both eyes   . BPH (benign prostatic hyperplasia)   . Hyperlipidemia   . Hypertension   . Tobacco abuse    Past Surgical History:  Procedure Laterality Date  . bilateral inguinal hernia  1974   Dr. Viona Gilmore  . broken foot bone    . broken hand  1998   Family History  Problem Relation Age of Onset  . Cancer Mother     Breast  . Heart disease Father 24  . Colon cancer Neg Hx   . Stomach cancer Neg Hx    History  Sexual Activity  . Sexual activity: Yes    Outpatient Encounter Prescriptions as of 05/09/2016  Medication Sig  . aspirin 81 MG tablet Take 81 mg by mouth 2 (two) times daily.  . B Complex Vitamins (B COMPLEX 1 PO) Take by mouth daily.  . cholecalciferol 2000 UNITS TABS Take 2,000 Units by mouth daily.  . [employer] ECHINACEA 400 MG CAPS Take 1 capsule by mouth. Takes it 3 times per week  . magnesium 30 MG tablet Take 30 mg by mouth. 1 by mouth 3 x weekly  . Omega-3 Fatty Acids (FISH OIL) 1000 MG CAPS Take by mouth daily.  . saw palmetto 500 MG capsule Take 500 mg by mouth daily.  . [DISCONTINUED] Multiple  Vitamins-Minerals (ECHINACEA ACZ PO) Take by mouth 3 (three) times a week.  . [DISCONTINUED] 0.9 %  sodium chloride infusion    No facility-administered encounter medications on file as of 05/09/2016.     Activities of Daily Living In your present state of health, do you have any difficulty performing the following activities: 05/09/2016  Hearing? Y  Vision? Y  Difficulty concentrating or making decisions? Y  Walking or climbing stairs? Y  Dressing or bathing? Y  Doing errands, shopping? N  Preparing Food and eating ? Y  Using the Toilet? N  In the past six months, have you accidently leaked urine? Y  Do you have problems with loss of bowel control? N  Managing your Medications? Y  Managing your Finances? Y  Housekeeping or managing your Housekeeping? Y  Some recent data might be hidden    Patient Care Team: Gayland Curry, DO as PCP - General (Geriatric Medicine)   Assessment:    Exercise Activities and Dietary recommendations Exercise limited by: Other - see comments  Goals    . Exercise 3x per week (30 min per time)          Starting 05/09/16, I will attempt to increase my physical  activity.     . Quit smoking / using tobacco      Fall Risk Fall Risk  05/09/2016 11/21/2014 01/23/2014  Falls in the past year? Yes No No  Number falls in past yr: 2 or more - -  Injury with Fall? No - -  Risk Factor Category  High Fall Risk - -  Risk for fall due to : Impaired balance/gait;Impaired vision;Impaired mobility - Impaired balance/gait;Impaired mobility;Impaired vision  Follow up Falls prevention discussed - -   Depression Screen PHQ 2/9 Scores 05/09/2016 11/21/2014 01/23/2014 08/09/2013  PHQ - 2 Score 2 0 0 0  PHQ- 9 Score 6 - - -    Cognitive Function MMSE - Mini Mental State Exam 05/09/2016  Not completed: Unable to complete         There is no immunization history on file for this patient. Screening Tests Health Maintenance  Topic Date Due  . Hepatitis C  Screening  11/17/1950  . TETANUS/TDAP  06/30/1968  . ZOSTAVAX  06/30/2009  . PNA vac Low Risk Adult (1 of 2 - PCV13) 06/30/2014  . INFLUENZA VACCINE  12/22/2015  . COLONOSCOPY  06/28/2018      Plan:    I have personally reviewed and addressed the Medicare Annual Wellness questionnaire and have noted the following in the patient's chart:  A. Medical and social history B. Use of alcohol, tobacco or illicit drugs  C. Current medications and supplements D. Functional ability and status E.  Nutritional status F.  Physical activity G. Advance directives H. List of other physicians I.  Hospitalizations, surgeries, and ER visits in previous 12 months J.  [place] to include hearing, vision, cognitive, depression L. Referrals and appointments - none  In addition, I have reviewed and discussed with patient certain preventive protocols, quality metrics, and best practice recommendations. A written personalized care plan for preventive services as well as general preventive health recommendations were provided to patient.  See attached scanned questionnaire for additional information.   Signed,   Allyn Kenner, LPN Health Advisor

## 2016-05-09 NOTE — Progress Notes (Signed)
   I reviewed health advisor's note, was available for consultation and agree with the assessment and plan as written.  Unfortunately, he refuses most everything always including workups for his pain and vaccinations.  I'd like to check his hep c screen with his routine labs before his physical.  Vienna Folden L. Ezra Marquess, D.O. [facility] [address], [place] [address] Cell Phone (Mon-Fri 8am-5pm):  [phone] On Call:  [phone] & follow prompts after 5pm & weekends Office Phone:  [phone] Office Fax:  [phone]   Quick Notes   Health Maintenance:   Due for Hep C, TDAP, Shingles, Flu, Pneu13/23. Pt refuses all vaccines.    Abnormal Screen: None; Unable to do MMSE due to blindness in both eyes   Patient Concerns:   Right hip pain on occasion, told due to arthritis.    Nurse Concerns:   Bp; 170/88, recheck: 160/88

## 2016-08-01 ENCOUNTER — Ambulatory Visit: Payer: Medicare Other | Admitting: Internal Medicine

## 2016-08-03 ENCOUNTER — Ambulatory Visit: Payer: Medicare Other | Admitting: Internal Medicine

## 2016-08-05 ENCOUNTER — Encounter: Payer: Self-pay | Admitting: Internal Medicine

## 2016-08-05 ENCOUNTER — Ambulatory Visit (INDEPENDENT_AMBULATORY_CARE_PROVIDER_SITE_OTHER): Payer: PPO | Admitting: Internal Medicine

## 2016-08-05 VITALS — BP 158/90 | HR 75 | Temp 98.1°F | Wt 143.0 lb

## 2016-08-05 DIAGNOSIS — S93401A Sprain of unspecified ligament of right ankle, initial encounter: Secondary | ICD-10-CM | POA: Diagnosis not present

## 2016-08-05 DIAGNOSIS — S76111D Strain of right quadriceps muscle, fascia and tendon, subsequent encounter: Secondary | ICD-10-CM

## 2016-08-05 DIAGNOSIS — M1611 Unilateral primary osteoarthritis, right hip: Secondary | ICD-10-CM | POA: Diagnosis not present

## 2016-08-05 MED ORDER — CELECOXIB 100 MG PO CAPS: 100.0000 mg | ORAL_CAPSULE | Freq: Two times a day (BID) | ORAL | 3 refills | Status: DC

## 2016-08-05 NOTE — Progress Notes (Signed)
Location:  [facility] clinic Provider:  Deara Bober L. Mariea Clonts, D.O., C.M.D.  Goals of Care:  Advanced Directives 08/05/2016  Does Patient Have a Medical Advance Directive? No  Would patient like information on creating a medical advance directive? -   Chief Complaint  Patient presents with  . Acute Visit    right hip pain, weakness    HPI: Patient is a 66 y.o. male with h/o blindness, tobacco abuse, BPH, HTN, hyperlipidemia seen today for acute visit for right hip pain and difficulty walking due to weakness and pain.  He does not recall that we did xrays in the past 07/18/11 and I wanted him to go back to orthopedics, but his wife remembers.    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  Had loss of joint space superiorly, mild acetabular spurring, subchondral cysts in right femoral neck.  Right SI joint corticated.    Pelvis:  Bilateral denerative changes left greater than right with remodeling of right femoral head.  In July of 2016, I though pt had ruptured quadriceps tendon on the right and OA of his right hip, but he refused to go to orthopedics.    Pain this time around began around thanksgiving.  Says he will be trying to go to sleep at night and the right leg will start jumping around.  Sometimes when he tries to go to the restroom, it will try to give out on him and sometimes it actually does and he falls down.  It hurts up in his groin.  Hurts enough that it gives out.  Left can catch him sometimes.  Right ankle also causing a pain when he steps on it wrong. He has not taken anything for pain.  His wife did use the voltaren some.  He does not want to take ibuprofen or anything.  Many years ago, he broke his right foot when he was about 66 yo--was fixed up.  6 wks later, cast removed, but seems foot never grew back right and he also sprained it a couple of times during hiking.   No heat or ice tried either.     Past Medical History:    Diagnosis Date  . Arthritis   . Blindness - both eyes   . BPH (benign prostatic hyperplasia)   . Hyperlipidemia   . Hypertension   . Tobacco abuse     Past Surgical History:  Procedure Laterality Date  . bilateral inguinal hernia  1974   Dr. Viona Gilmore  . broken foot bone    . broken hand  1998    Allergies  Allergen Reactions  . Epinephrine     Legs shake    Allergies as of 08/05/2016      Reactions   Epinephrine    Legs shake      Medication List       Accurate as of 08/05/16 11:06 AM. Always use your most recent med list.          aspirin 81 MG tablet Take 81 mg by mouth 2 (two) times daily.   B COMPLEX 1 PO Take by mouth daily.   [employer] ECHINACEA 400 MG Caps Take 1 capsule by mouth. Takes it 3 times per week   Fish Oil 1000 MG Caps Take by mouth daily.   magnesium 30 MG tablet Take 30 mg by mouth. 1 by mouth 3 x weekly   saw palmetto 500 MG capsule Take 500  mg by mouth daily.   Vitamin D3 2000 units Tabs Take 2,000 Units by mouth daily.       Review of Systems:  Review of Systems  Constitutional: Negative for chills and fever.  Eyes:       Blind  Respiratory: Negative for shortness of breath.   Cardiovascular: Negative for chest pain and palpitations.  Gastrointestinal: Negative for abdominal pain.  Genitourinary: Negative for dysuria.  Musculoskeletal: Positive for falls, joint pain and myalgias.  Neurological: Positive for tingling and focal weakness. Negative for dizziness.       Right thigh weakness  Psychiatric/Behavioral: Positive for memory loss.    Health Maintenance  Topic Date Due  . Hepatitis C Screening  [birth year]  . TETANUS/TDAP  06/30/1968  . PNA vac Low Risk Adult (1 of 2 - PCV13) 06/30/2014  . INFLUENZA VACCINE  12/22/2015  . COLONOSCOPY  06/28/2018    Physical Exam: Vitals:   08/05/16 1102  BP: (!) 158/90  Pulse: 75  Temp: 98.1 F (36.7 C)  TempSrc: Oral  SpO2: 98%  Weight: 143 lb (64.9 kg)   Body mass  index is 23.08 kg/m. Physical Exam  Constitutional: He is oriented to person, place, and time. No distress.  Cardiovascular: Normal rate, regular rhythm, normal heart sounds and intact distal pulses.   Pulmonary/Chest: Effort normal and breath sounds normal. No respiratory distress.  Abdominal: Bowel sounds are normal.  Musculoskeletal:  Right thigh with palpable mass laterally (suspect ruptured vastus lateralis tendon); is tender down anterolateral thigh and in groin, also tender right lateral ankle over ligaments  Neurological: He is alert and oriented to person, place, and time.  Short term memory loss--repeated the same questions multiple times  Skin: Skin is warm and dry.    Labs reviewed: Basic Metabolic Panel: No results for input(s): NA, K, CL, CO2, GLUCOSE, BUN, CREATININE, CALCIUM, MG, PHOS, TSH in the last 8760 hours. Liver Function Tests: No results for input(s): AST, ALT, ALKPHOS, BILITOT, PROT, ALBUMIN in the last 8760 hours. No results for input(s): LIPASE, AMYLASE in the last 8760 hours. No results for input(s): AMMONIA in the last 8760 hours. CBC: No results for input(s): WBC, NEUTROABS, HGB, HCT, MCV, PLT in the last 8760 hours. Lipid Panel: No results for input(s): CHOL, HDL, LDLCALC, TRIG, CHOLHDL, LDLDIRECT in the last 8760 hours. Lab Results  Component Value Date   HGBA1C 5.6 01/23/2014    Assessment/Plan 1. Rupture of right quadriceps tendon, subsequent encounter - still think this is the issue which has led to weakness, pain in the thigh, but also has right hip OA I suspect--previously had left xrays - celecoxib (CELEBREX) 100 MG capsule; Take 1 capsule (100 mg total) by mouth 2 (two) times daily.  Dispense: 60 capsule; Refill: 3 - Ambulatory referral to Physical Therapy  2. Sprain of right ankle, unspecified ligament, initial encounter - during one of his several recent falls - referred to PT to eval and tx--assist with pain, balance eval, and proper  assistive device selection - rest, elevate, ice ankle - celecoxib (CELEBREX) 100 MG capsule; Take 1 capsule (100 mg total) by mouth 2 (two) times daily.  Dispense: 60 capsule; Refill: 3 - Ambulatory referral to Physical Therapy  3. Primary osteoarthritis of right hip - suspect advanced and giving out - celecoxib (CELEBREX) 100 MG capsule; Take 1 capsule (100 mg total) by mouth 2 (two) times daily.  Dispense: 60 capsule; Refill: 3 - Ambulatory referral to Physical Therapy  Labs/tests ordered:   Orders  Placed This Encounter  Procedures  . Ambulatory referral to Physical Therapy    Referral Priority:   Routine    Referral Type:   Physical Medicine    Referral Reason:   Specialty Services Required    Requested Specialty:   Physical Therapy    Number of Visits Requested:   1   Next appt:  09/09/2016 for CPE   Jeneane Pieczynski L. Rahel Carlton, D.O. [facility] [address], [place] [address] Cell Phone (Mon-Fri 8am-5pm):  [phone] On Call:  [phone] & follow prompts after 5pm & weekends Office Phone:  [phone] Office Fax:  [phone]

## 2016-08-05 NOTE — Patient Instructions (Addendum)
Try celebrex 100mg  twice a day for the inflammation in your right hip and thigh and ankle/foot.    Use theragesic cream on the right thigh muscles (over the counter).  For your ankle/foot:  Elevate your right foot, ice the ankle and try to stay off of it.

## 2016-08-08 ENCOUNTER — Telehealth: Payer: Self-pay | Admitting: *Deleted

## 2016-08-08 NOTE — Telephone Encounter (Signed)
Let's stop the celebrex and continue the other measures we discussed at the appt.  If he's going to PT, they should be able to help with the dizziness or vertigo.

## 2016-08-08 NOTE — Telephone Encounter (Signed)
Patient caregiver, [name] called and stated that patient was seen on Friday and prescribed Celebrex. He is taking it as directed and ever since he started taking it he has been extremely tired with some dizziness. No fever. No Nausea. His father and brother has suffered with vertigo in the past.  Please Advise.

## 2016-08-09 NOTE — Telephone Encounter (Signed)
Patient caregiver, [name] notified and agreed. Medication list updated.

## 2016-09-09 ENCOUNTER — Encounter: Payer: Medicare Other | Admitting: Internal Medicine

## 2016-11-21 ENCOUNTER — Encounter: Payer: Self-pay | Admitting: Internal Medicine

## 2016-11-21 ENCOUNTER — Ambulatory Visit (INDEPENDENT_AMBULATORY_CARE_PROVIDER_SITE_OTHER): Payer: PPO | Admitting: Internal Medicine

## 2016-11-21 VITALS — BP 160/90 | HR 68 | Temp 98.5°F | Ht 66.0 in | Wt 142.0 lb

## 2016-11-21 DIAGNOSIS — Z720 Tobacco use: Secondary | ICD-10-CM

## 2016-11-21 DIAGNOSIS — E559 Vitamin D deficiency, unspecified: Secondary | ICD-10-CM

## 2016-11-21 DIAGNOSIS — E785 Hyperlipidemia, unspecified: Secondary | ICD-10-CM | POA: Diagnosis not present

## 2016-11-21 DIAGNOSIS — R413 Other amnesia: Secondary | ICD-10-CM | POA: Diagnosis not present

## 2016-11-21 DIAGNOSIS — Z0000 Encounter for general adult medical examination without abnormal findings: Secondary | ICD-10-CM | POA: Diagnosis not present

## 2016-11-21 DIAGNOSIS — H547 Unspecified visual loss: Secondary | ICD-10-CM

## 2016-11-21 DIAGNOSIS — I10 Essential (primary) hypertension: Secondary | ICD-10-CM

## 2016-11-21 DIAGNOSIS — H4720 Unspecified optic atrophy: Secondary | ICD-10-CM

## 2016-11-21 DIAGNOSIS — R739 Hyperglycemia, unspecified: Secondary | ICD-10-CM | POA: Diagnosis not present

## 2016-11-21 DIAGNOSIS — H6123 Impacted cerumen, bilateral: Secondary | ICD-10-CM | POA: Insufficient documentation

## 2016-11-21 LAB — LIPID PANEL
Cholesterol: 205 mg/dL — ABNORMAL HIGH (ref <200)
HDL: 40 mg/dL — ABNORMAL LOW (ref >40)
LDL Cholesterol: 127 mg/dL — ABNORMAL HIGH (ref <100)
Total CHOL/HDL Ratio: 5.1 Ratio — ABNORMAL HIGH (ref <5.0)
Triglycerides: 191 mg/dL — ABNORMAL HIGH (ref <150)
VLDL: 38 mg/dL — ABNORMAL HIGH (ref <30)

## 2016-11-21 LAB — COMPLETE METABOLIC PANEL WITH GFR
ALT: 14 U/L (ref 9–46)
AST: 19 U/L (ref 10–35)
Albumin: 4.5 g/dL (ref 3.6–5.1)
Alkaline Phosphatase: 59 U/L (ref 40–115)
BUN: 11 mg/dL (ref 7–25)
CO2: 22 mmol/L (ref 20–31)
Calcium: 9.3 mg/dL (ref 8.6–10.3)
Chloride: 102 mmol/L (ref 98–110)
Creat: 0.7 mg/dL (ref 0.70–1.25)
GFR, Est African American: >89 mL/min (ref >=60)
GFR, Est Non African American: >89 mL/min (ref >=60)
Glucose, Bld: 80 mg/dL (ref 65–99)
Potassium: 4.2 mmol/L (ref 3.5–5.3)
Sodium: 135 mmol/L (ref 135–146)
Total Bilirubin: 0.6 mg/dL (ref 0.2–1.2)
Total Protein: 7.5 g/dL (ref 6.1–8.1)

## 2016-11-21 LAB — CBC WITH DIFFERENTIAL/PLATELET
Basophils Absolute: 0 cells/uL (ref 0–200)
Basophils Relative: 0 %
Eosinophils Absolute: 558 cells/uL — ABNORMAL HIGH (ref 15–500)
Eosinophils Relative: 6 %
HCT: 45.7 % (ref 38.5–50.0)
Hemoglobin: 15.8 g/dL (ref 13.2–17.1)
Lymphocytes Relative: 30 %
Lymphs Abs: 2790 cells/uL (ref 850–3900)
MCH: 34.1 pg — ABNORMAL HIGH (ref 27.0–33.0)
MCHC: 34.6 g/dL (ref 32.0–36.0)
MCV: 98.7 fL (ref 80.0–100.0)
MPV: 9.9 fL (ref 7.5–12.5)
Monocytes Absolute: 837 cells/uL (ref 200–950)
Monocytes Relative: 9 %
Neutro Abs: 5115 cells/uL (ref 1500–7800)
Neutrophils Relative %: 55 %
Platelets: 253 10*3/uL (ref 140–400)
RBC: 4.63 MIL/uL (ref 4.20–5.80)
RDW: 13.5 % (ref 11.0–15.0)
WBC: 9.3 10*3/uL (ref 3.8–10.8)

## 2016-11-21 MED ORDER — LISINOPRIL 10 MG PO TABS: 10.0000 mg | ORAL_TABLET | Freq: Every day | ORAL | 3 refills | Status: DC

## 2016-11-21 NOTE — Progress Notes (Signed)
Provider:  Rexene Edison. Mariea Clonts, D.O., C.M.D. Location:   [facility]  Place of Service:   clinic  Previous PCP: Gayland Curry, DO Patient Care Team: Gayland Curry, DO as PCP - General (Geriatric Medicine)  Extended Emergency Contact Information Primary Emergency Contact: [facility] Address: [address]          [place], [place] [address] [place] Phone: [phone] Relation: Spouse  Goals of Care: Advanced Directive information Advanced Directives 08/05/2016  Does Patient Have a Medical Advance Directive? No  Would patient like information on creating a medical advance directive? -   Chief Complaint  Patient presents with  . Annual Exam    CPE   HPI: Patient is a 66 y.o. male seen today for an annual physical exam.  Pt does not come to be seen consistently so no recent labs.  Last time I saw him in March, we opted to try celebrex 100mg  po bid for right hip/thigh/ankle/foot pain and theragesic cream to thigh muscles.  Elevating right foot and icing ankle, staying off of it.  Recommended therapy for dizziness/vertigo.  He opted to stop celebrex due to extreme sleepiness (6hr nap).  Did help his hip quite a bit though.  Tylenol does help some.  Said he was seeing things that weren't there--trees and bushes from the kitchen to the bedroom.  He's also having memory issues happening per his wife.  He forgot what town they live in--mixed up [place] and [place].  Disoriented at night going to the bathroom where he is.  He's not seen any people or typical hallucinations.  He's no longer able to discern what objects or people are.  He is not remembering political figures and rock stars now.    Smoking:  Ongoing.  Not willing to quit despite counseling.  Down to 3-4 cigarettes per day.  Denies shortness of breath.  HTN:  BP elevated again today.  Not on meds, but should be.  Hyperlipidemia:  Has not had recent labs.  Has refused statin  therapy.  Blindness/optic nerve atrophy bilaterally:  Ongoing.  Had been told there was nothing that could be done for the optic atrophy by Dr. Ricki Miller.  He needs someone to lead him around now all of the time.  That last visit was probably 10 years ago and there was no glaucoma or macular degeneration at that time.  His wife reports that this progressive loss was what was expected.    Hyperglycemia:  Also has not had recent labs for this.    Vitamin D deficiency:  On vitamin D 2000 units daily.  Ears full of wax.  Getting flushed.  Was interfering with hearing.    Past Medical History:  Diagnosis Date  . Arthritis   . Blindness - both eyes   . BPH (benign prostatic hyperplasia)   . Hyperlipidemia   . Hypertension   . Tobacco abuse    Past Surgical History:  Procedure Laterality Date  . bilateral inguinal hernia  1974   Dr. Viona Gilmore  . broken foot bone    . broken hand  1998    reports that he has been smoking Cigarettes.  He has been smoking about 0.50 packs per day. He has never used smokeless tobacco. He reports that he uses drugs, including Marijuana. He reports that he does not drink alcohol.  Functional Status Survey:  needs some assistance ambulating now   Family History  Problem Relation Age of Onset  . Cancer  Mother        Breast  . Heart disease Father 19  . Colon cancer Neg Hx   . Stomach cancer Neg Hx     Health Maintenance  Topic Date Due  . Hepatitis C Screening  12/21/1950  . COLONOSCOPY  06/28/2018    Allergies  Allergen Reactions  . Epinephrine     Legs shake    Allergies as of 11/21/2016      Reactions   Epinephrine    Legs shake      Medication List       Accurate as of 11/21/16  9:16 AM. Always use your most recent med list.          aspirin 81 MG tablet Take 81 mg by mouth 2 (two) times daily.   B COMPLEX 1 PO Take by mouth daily.   [employer]S ECHINACEA 400 MG Caps Take 1 capsule by mouth. Takes it 3 times per week   Fish Oil 1000  MG Caps Take by mouth daily.   magnesium 30 MG tablet Take 30 mg by mouth. 1 by mouth 3 x weekly   saw palmetto 500 MG capsule Take 500 mg by mouth daily.   Vitamin D3 2000 units Tabs Take 2,000 Units by mouth daily.       Review of Systems  Constitutional: Positive for malaise/fatigue. Negative for chills and fever.  HENT: Positive for hearing loss.        Cerumen impaction  Eyes: Positive for blurred vision.       Blindness, unable to even see figures approach him now  Respiratory: Positive for wheezing. Negative for cough, sputum production and shortness of breath.   Cardiovascular: Negative for chest pain, palpitations and leg swelling.  Gastrointestinal: Positive for constipation. Negative for abdominal pain, blood in stool, melena, nausea and vomiting.  Genitourinary: Negative for dysuria.  Musculoskeletal: Positive for falls, joint pain and myalgias.       Tender over right quadriceps where tendon was ruptured years ago  Skin: Negative for itching and rash.  Neurological: Positive for focal weakness. Negative for dizziness and weakness.  Endo/Heme/Allergies: Bruises/bleeds easily.  Psychiatric/Behavioral: Positive for memory loss. Negative for depression.    There were no vitals filed for this visit. There is no height or weight on file to calculate BMI. Physical Exam  Constitutional: He is oriented to person, place, and time. No distress.  Thin white male  HENT:  Head: Normocephalic and atraumatic.  Eyes: EOM are normal. Pupils are equal, round, and reactive to light.  blind  Neck: Neck supple. No JVD present.  Cardiovascular: Normal rate, regular rhythm, normal heart sounds and intact distal pulses.   Pulmonary/Chest: Effort normal. He has wheezes. He has no rales. He exhibits no tenderness.  Right base  Abdominal: Soft. Bowel sounds are normal. He exhibits no distension. There is no tenderness.  Musculoskeletal: He exhibits tenderness.  Walks with limp with  red-tipped cane  Lymphadenopathy:    He has no cervical adenopathy.  Neurological: He is alert and oriented to person, place, and time.  Skin: Skin is warm and dry. Capillary refill takes less than 2 seconds. There is pallor.    Labs reviewed: Basic Metabolic Panel: No results for input(s): NA, K, CL, CO2, GLUCOSE, BUN, CREATININE, CALCIUM, MG, PHOS in the last 8760 hours. Liver Function Tests: No results for input(s): AST, ALT, ALKPHOS, BILITOT, PROT, ALBUMIN in the last 8760 hours. No results for input(s): LIPASE, AMYLASE in the last 8760 hours.  No results for input(s): AMMONIA in the last 8760 hours. CBC: No results for input(s): WBC, NEUTROABS, HGB, HCT, MCV, PLT in the last 8760 hours. Cardiac Enzymes: No results for input(s): CKTOTAL, CKMB, CKMBINDEX, TROPONINI in the last 8760 hours. BNP: Invalid input(s): POCBNP Lab Results  Component Value Date   HGBA1C 5.6 01/23/2014   No results found for: TSH No results found for: VITAMINB12 No results found for: FOLATE No results found for: IRON, TIBC, FERRITIN  Imaging and Procedures Recently: No new  Assessment/Plan 1. Annual physical exam - performed today, needs labs: - CBC with Differential/Platelet - COMPLETE METABOLIC PANEL WITH GFR - Hemoglobin A1c - Lipid panel -refuses all vaccinations  2. Essential hypertension, benign -bp elevated again today, not thrilled about taking a daily med, but clearly needed--discussed risks of stroke and MI if does not take something -wife takes lisinopril and tolerates so will use it b/c they both tend to react frequently to "real medications" - COMPLETE METABOLIC PANEL WITH GFR - lisinopril (PRINIVIL,ZESTRIL) 10 MG tablet; Take 1 tablet (10 mg total) by mouth daily.  Dispense: 90 tablet; Refill: 3  3. Tobacco abuse -ongoing, no plans to quit but does smoke less than he once did  4. Hyperglycemia - needs f/u labs:   - Hemoglobin A1c -tries to eat healthily and exercise, but  limited by his right leg pain and weakness, blindness  5. Hyperlipidemia LDL goal <100 - f/u lab: - Lipid panel -refuses "real meds" for this but takes fish oil  6. 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 Vitamin D insufficiency -continues on vitamin D 2000 units daily for this and to help with balance  9. Hearing loss secondary to cerumen impaction, bilateral -ears irrigated today with warm water and peroxide solution and large amounts of cerumen came out  10. Memory loss -progressing, his wife is quite concerned, pt does not want to see specialists and is hard to get ready to come here due to combative behavior at times (recommend neurology) -MMSE today done--cannot do anything requiring visual input  Labs/tests ordered:   Orders Placed This Encounter  Procedures  . CBC with Differential/Platelet  . COMPLETE METABOLIC PANEL WITH GFR  . Hemoglobin A1c  . Lipid panel  irrigation of bilateral ears mmse  Geeta Dworkin L. Jermari Tamargo, D.O. [facility] [address], [place] [address] Cell Phone (Mon-Fri 8am-5pm):  [phone] On Call:  [phone] & follow prompts after 5pm & weekends Office Phone:  [phone] Office Fax:  [phone]

## 2016-11-22 ENCOUNTER — Encounter: Payer: Self-pay | Admitting: *Deleted

## 2016-11-22 LAB — HEMOGLOBIN A1C
Hgb A1c MFr Bld: 5.4 % (ref <5.7)
Mean Plasma Glucose: 108 mg/dL

## 2017-04-26 ENCOUNTER — Telehealth: Payer: Self-pay

## 2017-04-26 NOTE — Telephone Encounter (Signed)
Called to schedule AWV appointment. Pts wife stated he will call back to schedule

## 2017-05-27 DIAGNOSIS — F29 Unspecified psychosis not due to a substance or known physiological condition: Secondary | ICD-10-CM | POA: Diagnosis not present

## 2017-05-27 DIAGNOSIS — F23 Brief psychotic disorder: Secondary | ICD-10-CM | POA: Diagnosis not present

## 2017-05-28 ENCOUNTER — Other Ambulatory Visit: Payer: Self-pay

## 2017-05-28 ENCOUNTER — Encounter (HOSPITAL_COMMUNITY): Payer: Self-pay | Admitting: Emergency Medicine

## 2017-05-28 ENCOUNTER — Emergency Department (HOSPITAL_COMMUNITY): Payer: PPO

## 2017-05-28 ENCOUNTER — Emergency Department (HOSPITAL_COMMUNITY)
Admission: EM | Admit: 2017-05-28 | Discharge: 2017-05-31 | Disposition: A | Discharge status: Other Acute Inpatient Hospital | Payer: PPO | Attending: Emergency Medicine | Admitting: Emergency Medicine

## 2017-05-28 DIAGNOSIS — F331 Major depressive disorder, recurrent, moderate: Secondary | ICD-10-CM | POA: Insufficient documentation

## 2017-05-28 DIAGNOSIS — F22 Delusional disorders: Secondary | ICD-10-CM | POA: Diagnosis not present

## 2017-05-28 DIAGNOSIS — F0391 Unspecified dementia with behavioral disturbance: Secondary | ICD-10-CM | POA: Diagnosis present

## 2017-05-28 DIAGNOSIS — I10 Essential (primary) hypertension: Secondary | ICD-10-CM | POA: Diagnosis not present

## 2017-05-28 DIAGNOSIS — F0281 Dementia in other diseases classified elsewhere with behavioral disturbance: Secondary | ICD-10-CM | POA: Diagnosis not present

## 2017-05-28 DIAGNOSIS — R413 Other amnesia: Secondary | ICD-10-CM | POA: Diagnosis present

## 2017-05-28 DIAGNOSIS — F1210 Cannabis abuse, uncomplicated: Secondary | ICD-10-CM | POA: Diagnosis not present

## 2017-05-28 DIAGNOSIS — F0151 Vascular dementia with behavioral disturbance: Secondary | ICD-10-CM | POA: Insufficient documentation

## 2017-05-28 DIAGNOSIS — F17210 Nicotine dependence, cigarettes, uncomplicated: Secondary | ICD-10-CM | POA: Insufficient documentation

## 2017-05-28 DIAGNOSIS — F03918 Unspecified dementia, unspecified severity, with other behavioral disturbance: Secondary | ICD-10-CM | POA: Diagnosis present

## 2017-05-28 DIAGNOSIS — G309 Alzheimer's disease, unspecified: Secondary | ICD-10-CM

## 2017-05-28 DIAGNOSIS — Z0000 Encounter for general adult medical examination without abnormal findings: Secondary | ICD-10-CM

## 2017-05-28 DIAGNOSIS — Z7982 Long term (current) use of aspirin: Secondary | ICD-10-CM | POA: Diagnosis not present

## 2017-05-28 DIAGNOSIS — F1290 Cannabis use, unspecified, uncomplicated: Secondary | ICD-10-CM | POA: Diagnosis not present

## 2017-05-28 DIAGNOSIS — Z79899 Other long term (current) drug therapy: Secondary | ICD-10-CM | POA: Diagnosis not present

## 2017-05-28 DIAGNOSIS — H4720 Unspecified optic atrophy: Secondary | ICD-10-CM | POA: Diagnosis present

## 2017-05-28 DIAGNOSIS — J9811 Atelectasis: Secondary | ICD-10-CM | POA: Diagnosis not present

## 2017-05-28 DIAGNOSIS — H6123 Impacted cerumen, bilateral: Secondary | ICD-10-CM | POA: Diagnosis present

## 2017-05-28 DIAGNOSIS — R456 Violent behavior: Secondary | ICD-10-CM | POA: Diagnosis not present

## 2017-05-28 DIAGNOSIS — H547 Unspecified visual loss: Secondary | ICD-10-CM

## 2017-05-28 DIAGNOSIS — R4587 Impulsiveness: Secondary | ICD-10-CM | POA: Diagnosis not present

## 2017-05-28 LAB — COMPREHENSIVE METABOLIC PANEL
ALT: 15 U/L — ABNORMAL LOW (ref 17–63)
ANION GAP: 8 (ref 5–15)
AST: 24 U/L (ref 15–41)
Albumin: 4.3 g/dL (ref 3.5–5.0)
Alkaline Phosphatase: 69 U/L (ref 38–126)
BILIRUBIN TOTAL: 0.9 mg/dL (ref 0.3–1.2)
BUN: 13 mg/dL (ref 6–20)
CO2: 22 mmol/L (ref 22–32)
Calcium: 8.9 mg/dL (ref 8.9–10.3)
Chloride: 102 mmol/L (ref 101–111)
Creatinine, Ser: 0.62 mg/dL (ref 0.61–1.24)
GFR calc non Af Amer: >60 mL/min (ref >60)
GLUCOSE: 103 mg/dL — AB (ref 65–99)
POTASSIUM: 3.9 mmol/L (ref 3.5–5.1)
SODIUM: 132 mmol/L — AB (ref 135–145)
TOTAL PROTEIN: 7.3 g/dL (ref 6.5–8.1)

## 2017-05-28 LAB — CBC
HCT: 42.2 % (ref 39.0–52.0)
HEMOGLOBIN: 15.1 g/dL (ref 13.0–17.0)
MCH: 34.3 pg — AB (ref 26.0–34.0)
MCHC: 35.8 g/dL (ref 30.0–36.0)
MCV: 95.9 fL (ref 78.0–100.0)
Platelets: 243 10*3/uL (ref 150–400)
RBC: 4.4 MIL/uL (ref 4.22–5.81)
RDW: 12.7 % (ref 11.5–15.5)
WBC: 9.6 10*3/uL (ref 4.0–10.5)

## 2017-05-28 LAB — RAPID URINE DRUG SCREEN, HOSP PERFORMED
AMPHETAMINES: NOT DETECTED
BENZODIAZEPINES: NOT DETECTED
Barbiturates: NOT DETECTED
COCAINE: NOT DETECTED
Opiates: NOT DETECTED
TETRAHYDROCANNABINOL: NOT DETECTED

## 2017-05-28 LAB — URINALYSIS, COMPLETE (UACMP) WITH MICROSCOPIC
BILIRUBIN URINE: NEGATIVE
Glucose, UA: NEGATIVE mg/dL
Ketones, ur: 5 mg/dL — AB
Nitrite: NEGATIVE
PROTEIN: 30 mg/dL — AB
Specific Gravity, Urine: 1.016 (ref 1.005–1.030)
pH: 7 (ref 5.0–8.0)

## 2017-05-28 LAB — ETHANOL

## 2017-05-28 MED ORDER — ASPIRIN EC 81 MG PO TBEC
81.0000 mg | DELAYED_RELEASE_TABLET | Freq: Two times a day (BID) | ORAL | Status: DC
  Administered 2017-05-28 – 2017-05-31 (×6): 81 mg via ORAL
  Filled 2017-05-28 (×9): qty 1

## 2017-05-28 MED ORDER — LORAZEPAM 2 MG/ML IJ SOLN
1.0000 mg | Freq: Once | INTRAMUSCULAR | Status: AC
  Administered 2017-05-28: 1 mg via INTRAMUSCULAR
  Filled 2017-05-28: qty 1

## 2017-05-28 MED ORDER — NICOTINE 14 MG/24HR TD PT24
14.0000 mg | MEDICATED_PATCH | Freq: Once | TRANSDERMAL | Status: AC
  Administered 2017-05-28: 14 mg via TRANSDERMAL
  Filled 2017-05-28: qty 1

## 2017-05-28 MED ORDER — CITALOPRAM HYDROBROMIDE 10 MG PO TABS
10.0000 mg | ORAL_TABLET | Freq: Every day | ORAL | Status: DC
  Administered 2017-05-28 – 2017-05-31 (×4): 10 mg via ORAL
  Filled 2017-05-28 (×4): qty 1

## 2017-05-28 MED ORDER — RISPERIDONE 0.5 MG PO TABS
0.5000 mg | ORAL_TABLET | Freq: Two times a day (BID) | ORAL | Status: DC
  Administered 2017-05-28 – 2017-05-31 (×6): 0.5 mg via ORAL
  Filled 2017-05-28 (×5): qty 1

## 2017-05-28 MED ORDER — LORAZEPAM 1 MG PO TABS
1.0000 mg | ORAL_TABLET | Freq: Four times a day (QID) | ORAL | Status: DC | PRN
  Administered 2017-05-28 – 2017-05-30 (×3): 1 mg via ORAL
  Filled 2017-05-28 (×3): qty 1

## 2017-05-28 MED ORDER — HALOPERIDOL LACTATE 5 MG/ML IJ SOLN
2.0000 mg | Freq: Once | INTRAMUSCULAR | Status: AC
  Administered 2017-05-28: 2 mg via INTRAMUSCULAR
  Filled 2017-05-28: qty 1

## 2017-05-28 MED ORDER — ACETAMINOPHEN 325 MG PO TABS
650.0000 mg | ORAL_TABLET | Freq: Four times a day (QID) | ORAL | Status: DC | PRN
  Administered 2017-05-28: 650 mg via ORAL
  Filled 2017-05-28: qty 2

## 2017-05-28 MED ORDER — LISINOPRIL 10 MG PO TABS
10.0000 mg | ORAL_TABLET | Freq: Every day | ORAL | Status: DC
  Administered 2017-05-28 – 2017-05-31 (×4): 10 mg via ORAL
  Filled 2017-05-28 (×4): qty 1
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
## 2017-05-28 NOTE — ED Provider Notes (Signed)
CXR findings reviewed.  Not clinically significant.  Nicotine patch ordered as requested.   Dorie Rank, MD 05/28/17 1315

## 2017-05-28 NOTE — BHH Counselor (Signed)
05/28/17 05/28/2017 Dr. Darleene Cleaver Jinny Blossom, NP recommend IP geropsych treatment. TTS to seek placement.
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
## 2017-05-28 NOTE — ED Notes (Signed)
Bed: [facility] Expected date:  Expected time:  Means of arrival:  Comments: 

## 2017-05-28 NOTE — ED Notes (Signed)
ED Provider at bedside. 

## 2017-05-28 NOTE — Progress Notes (Signed)
CSW was asked by the pt's RN to advise the pt's wife [name] at ph: [phone] or [phone] that pt is being referred out to facilities and that attempts are being made to find geripsyche placement for the pt.  Per RN, Pt's wife is becoming agitated at the thought of the pt being D/C'd home.  Per notes and staff, pt's wife was previously insistent on pt being home, but now is tearful and insistent pt cannot perform ADL's  and return home at this time .  CSW counseled pt's wife pt has been referred out to [place], [place], [facility], [facility] and [facility].  Pt's wife is insistent pt must be close to home.  CSW counseled pt's wife pt, must go to first accepting facility due to the time-constraints placed on ED pts' cases and their need need for being expedited.  Pt's wife states prefrences are [place], [place] [facility] and also [place] because pt has a brother and sister in [facility]).  CSW stated to pt's wife CSW would place preferences in note but again, pt would need to transport to first accepting facility is still appropriate at time of D/C.  Pt's wife was appreciative and thanked the CSW.  Please reconsult if future social work needs arise.  CSW signing off, as social work intervention is no longer needed.  Joe Guild. Lafern Brinkley, LCSW, LCAS, CSI Clinical Social Worker Ph: [phone]     '
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
## 2017-05-29 ENCOUNTER — Telehealth: Payer: Self-pay

## 2017-05-29 DIAGNOSIS — R456 Violent behavior: Secondary | ICD-10-CM | POA: Diagnosis not present

## 2017-05-29 DIAGNOSIS — F1290 Cannabis use, unspecified, uncomplicated: Secondary | ICD-10-CM

## 2017-05-29 DIAGNOSIS — F17210 Nicotine dependence, cigarettes, uncomplicated: Secondary | ICD-10-CM | POA: Diagnosis not present

## 2017-05-29 DIAGNOSIS — F0391 Unspecified dementia with behavioral disturbance: Secondary | ICD-10-CM | POA: Diagnosis not present

## 2017-05-29 NOTE — ED Notes (Signed)
Report given to RN

## 2017-05-29 NOTE — BH Specialist Note (Signed)
Handing care off to NT Daniqua at this time; introduced NT to pt. Pt asks for my name again and stated, "I have a step-daughter named Maudie Mercury; she's about 68 years old". Unable to confirm this. Pt denies having needs for toileting or meals/drinks at this time. "Ok, will nice to see you - will see you some other place", stated pt.
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
## 2017-05-29 NOTE — BH Specialist Note (Addendum)
Introduced myself to wife [name]) and pt. Offered my hand and we exchanged physical hand touch. Wife spoke about needing to go home and check on their doggie. 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 Pt later spelled out [id number] stating "I think that is how you spell it". Assisted pt to stand at bedside with nursing tech on one side. Pt. able to stand with support and void in urinal approximately 250 cc's. NT stated she had pt yesterday and that he had not eaten yesterday. Breakfast on bedside table - uneaten. After wife left, pt able to drink about 120 cc's water and about 60 cc's orange juice. Currently accepted offer for toast with slice of bacon wrapped around it. Appears calm and comfortable. Rquested that the TV stay on in the background.

## 2017-05-29 NOTE — Telephone Encounter (Signed)
I left [name] a message on her voicemail after reviewing the latest events.  It seems to me that a psychiatric admission would certainly be a good option at this point.  I've asked staff to come get me if [facility] calls back.
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
## 2017-05-29 NOTE — ED Notes (Signed)
Wife- [phone]           [phone]

## 2017-05-29 NOTE — BH Assessment (Signed)
[facility] Assessment Progress Note  Per Buford Dresser, DO, this pt requires psychiatric hospitalization at this time.  Pt presents under IVC initiated by pt's wife, which Corena Pilgrim, MD has upheld.  The following facilities have been contacted to seek placement for this pt, with results as noted:  Beds available, information sent, decision pending:  [place]:  [place], [place] Coordinator [phone]

## 2017-05-29 NOTE — BH Specialist Note (Addendum)
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 Finished toast and bacon and 60 cc's water at this time. "This is the most I have eaten - I think it (assuming he means his appetite) is coming back".

## 2017-05-29 NOTE — Progress Notes (Signed)
CSW received a call from [place] at [facility] stating no beds are available at this time.  Please reconsult if future social work needs arise.  CSW signing off, as social work intervention is no longer needed.  Alphonse Guild. Maryln Eastham, LCSW, LCAS, CSI Clinical Social Worker Ph: [phone]

## 2017-05-30 MED ORDER — NICOTINE 21 MG/24HR TD PT24
21.0000 mg | MEDICATED_PATCH | Freq: Once | TRANSDERMAL | Status: AC
  Administered 2017-05-30: 21 mg via TRANSDERMAL
  Filled 2017-05-30: qty 1

## 2017-05-30 NOTE — Telephone Encounter (Signed)
Ella relayed that they were searching for an appropriate behavioral health longer term unit for Joe Dawson until his medications could kick in.  She will keep [place] posted.

## 2017-05-30 NOTE — BH Assessment (Deleted)
[facility] Assessment Progress Note  Per Buford Dresser, DO, this pt continues to require psychiatric hospitalization at this time.  The following facilities have been contacted to seek placement for this pt, with results as noted:  Beds available, information sent, decision pending:  [facility]   At capacity:  [facility] [place]   [place], [place] Coordinator [phone]

## 2017-05-30 NOTE — Telephone Encounter (Signed)
Dr.Reed spoke with patient's wife yesterday.  Dr.Reed please advise if any pertinent information needs to be noted prior to closing encounter

## 2017-05-30 NOTE — ED Notes (Signed)
Pt fell from bed when attempting to get up unassisted. RN and Actuary were discussing pt care plan when another staff member witnessed the pt descend to the floor from the bed. MD and charge RN made aware. Pt with no obvious signs of injury. MD to see pt.

## 2017-05-30 NOTE — BH Assessment (Signed)
[facility] Assessment Progress Note  Per Buford Dresser, DO, this pt continues to require psychiatric hospitalization at this time.  The following facilities have been contacted to seek placement for this pt, with results as noted:  Beds available, information sent, decision pending:  [facility]   At capacity:  [place], Dresser Coordinator [phone]

## 2017-05-30 NOTE — BH Assessment (Signed)
[facility] Assessment Progress Note  Pt reassessed today. Pt continues to be delusional and nonsensical in his speech and thought content. IP treatment continues to be recommended for him.   Kenna [place]. Lovena Le, [place], [place], LPCA Counselor

## 2017-05-31 DIAGNOSIS — S79912A Unspecified injury of left hip, initial encounter: Secondary | ICD-10-CM | POA: Diagnosis not present

## 2017-05-31 DIAGNOSIS — F331 Major depressive disorder, recurrent, moderate: Secondary | ICD-10-CM | POA: Diagnosis not present

## 2017-05-31 DIAGNOSIS — H6123 Impacted cerumen, bilateral: Secondary | ICD-10-CM | POA: Diagnosis not present

## 2017-05-31 DIAGNOSIS — R9431 Abnormal electrocardiogram [ECG] [EKG]: Secondary | ICD-10-CM | POA: Diagnosis not present

## 2017-05-31 DIAGNOSIS — H547 Unspecified visual loss: Secondary | ICD-10-CM | POA: Diagnosis not present

## 2017-05-31 DIAGNOSIS — Z9181 History of falling: Secondary | ICD-10-CM | POA: Diagnosis not present

## 2017-05-31 DIAGNOSIS — K5900 Constipation, unspecified: Secondary | ICD-10-CM | POA: Diagnosis not present

## 2017-05-31 DIAGNOSIS — F1210 Cannabis abuse, uncomplicated: Secondary | ICD-10-CM | POA: Diagnosis not present

## 2017-05-31 DIAGNOSIS — Z720 Tobacco use: Secondary | ICD-10-CM | POA: Diagnosis not present

## 2017-05-31 DIAGNOSIS — R4587 Impulsiveness: Secondary | ICD-10-CM | POA: Diagnosis not present

## 2017-05-31 DIAGNOSIS — N390 Urinary tract infection, site not specified: Secondary | ICD-10-CM | POA: Diagnosis not present

## 2017-05-31 DIAGNOSIS — I10 Essential (primary) hypertension: Secondary | ICD-10-CM | POA: Diagnosis not present

## 2017-05-31 DIAGNOSIS — B3749 Other urogenital candidiasis: Secondary | ICD-10-CM | POA: Diagnosis not present

## 2017-05-31 DIAGNOSIS — M4856XA Collapsed vertebra, not elsewhere classified, lumbar region, initial encounter for fracture: Secondary | ICD-10-CM | POA: Diagnosis not present

## 2017-05-31 DIAGNOSIS — I714 Abdominal aortic aneurysm, without rupture: Secondary | ICD-10-CM | POA: Diagnosis not present

## 2017-05-31 DIAGNOSIS — S32049A Unspecified fracture of fourth lumbar vertebra, initial encounter for closed fracture: Secondary | ICD-10-CM | POA: Diagnosis not present

## 2017-05-31 DIAGNOSIS — F0391 Unspecified dementia with behavioral disturbance: Secondary | ICD-10-CM

## 2017-05-31 DIAGNOSIS — M1611 Unilateral primary osteoarthritis, right hip: Secondary | ICD-10-CM | POA: Diagnosis not present

## 2017-05-31 DIAGNOSIS — E785 Hyperlipidemia, unspecified: Secondary | ICD-10-CM | POA: Diagnosis not present

## 2017-05-31 DIAGNOSIS — N3000 Acute cystitis without hematuria: Secondary | ICD-10-CM | POA: Diagnosis not present

## 2017-05-31 DIAGNOSIS — I4581 Long QT syndrome: Secondary | ICD-10-CM | POA: Diagnosis not present

## 2017-05-31 DIAGNOSIS — Z888 Allergy status to other drugs, medicaments and biological substances status: Secondary | ICD-10-CM | POA: Diagnosis not present

## 2017-05-31 DIAGNOSIS — B961 Klebsiella pneumoniae [K. pneumoniae] as the cause of diseases classified elsewhere: Secondary | ICD-10-CM | POA: Diagnosis not present

## 2017-05-31 DIAGNOSIS — G9389 Other specified disorders of brain: Secondary | ICD-10-CM | POA: Diagnosis not present

## 2017-05-31 DIAGNOSIS — N3289 Other specified disorders of bladder: Secondary | ICD-10-CM | POA: Diagnosis not present

## 2017-05-31 DIAGNOSIS — R339 Retention of urine, unspecified: Secondary | ICD-10-CM | POA: Diagnosis not present

## 2017-05-31 DIAGNOSIS — H47293 Other optic atrophy, bilateral: Secondary | ICD-10-CM | POA: Diagnosis not present

## 2017-05-31 DIAGNOSIS — F17200 Nicotine dependence, unspecified, uncomplicated: Secondary | ICD-10-CM | POA: Diagnosis not present

## 2017-05-31 DIAGNOSIS — R3914 Feeling of incomplete bladder emptying: Secondary | ICD-10-CM | POA: Diagnosis not present

## 2017-05-31 DIAGNOSIS — R338 Other retention of urine: Secondary | ICD-10-CM | POA: Diagnosis not present

## 2017-05-31 DIAGNOSIS — S0990XA Unspecified injury of head, initial encounter: Secondary | ICD-10-CM | POA: Diagnosis not present

## 2017-05-31 DIAGNOSIS — M25451 Effusion, right hip: Secondary | ICD-10-CM | POA: Diagnosis not present

## 2017-05-31 DIAGNOSIS — M1990 Unspecified osteoarthritis, unspecified site: Secondary | ICD-10-CM | POA: Diagnosis not present

## 2017-05-31 DIAGNOSIS — S32040D Wedge compression fracture of fourth lumbar vertebra, subsequent encounter for fracture with routine healing: Secondary | ICD-10-CM | POA: Diagnosis not present

## 2017-05-31 DIAGNOSIS — K439 Ventral hernia without obstruction or gangrene: Secondary | ICD-10-CM | POA: Diagnosis not present

## 2017-05-31 DIAGNOSIS — E559 Vitamin D deficiency, unspecified: Secondary | ICD-10-CM | POA: Diagnosis not present

## 2017-05-31 DIAGNOSIS — N401 Enlarged prostate with lower urinary tract symptoms: Secondary | ICD-10-CM | POA: Diagnosis not present

## 2017-05-31 DIAGNOSIS — I672 Cerebral atherosclerosis: Secondary | ICD-10-CM | POA: Diagnosis not present

## 2017-05-31 DIAGNOSIS — N400 Enlarged prostate without lower urinary tract symptoms: Secondary | ICD-10-CM | POA: Diagnosis not present

## 2017-05-31 DIAGNOSIS — R509 Fever, unspecified: Secondary | ICD-10-CM | POA: Diagnosis not present

## 2017-05-31 DIAGNOSIS — F17210 Nicotine dependence, cigarettes, uncomplicated: Secondary | ICD-10-CM

## 2017-05-31 DIAGNOSIS — H548 Legal blindness, as defined in USA: Secondary | ICD-10-CM | POA: Diagnosis not present

## 2017-05-31 DIAGNOSIS — Y92239 Unspecified place in hospital as the place of occurrence of the external cause: Secondary | ICD-10-CM | POA: Diagnosis not present

## 2017-05-31 DIAGNOSIS — G35 Multiple sclerosis: Secondary | ICD-10-CM | POA: Diagnosis not present

## 2017-05-31 DIAGNOSIS — I080 Rheumatic disorders of both mitral and aortic valves: Secondary | ICD-10-CM | POA: Diagnosis not present

## 2017-05-31 DIAGNOSIS — W19XXXD Unspecified fall, subsequent encounter: Secondary | ICD-10-CM | POA: Diagnosis not present

## 2017-05-31 NOTE — BH Assessment (Addendum)
[facility] Assessment Progress Note  At 09:38 this Probation officer called Lenna Sciara at [facility].  She confirms that pt has been pre-accepted to their facility by Dr Alonna Minium in anticipation of discharges scheduled for later today.  She will call when they are ready to receive the pt.  Report is to be called to [phone].  Pt is under IVC, and is to be transported via [facility] when the time comes.  This will be staffed with Buford Dresser, DO.  Jalene Mullet, [place] [facility] Coordinator [phone]   Addendum:  At 13:16 Melissa calls back from [facility].  They are now ready to receive pt.  Pt's nurse has been notified.  Jalene Mullet, [place] Coordinator [phone]

## 2017-05-31 NOTE — BHH Counselor (Addendum)
Per Lenna Sciara, pt has been accepted to [facility] pending discharges. Melissa reported, she will call when the pt can arrive and assigned a bed will be discussed after discharges. Mingo Amber, RN.    Vertell Novak, MS, [facility], [facility] Triage Specialist [phone]
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
## 2017-06-01 LAB — TSH: TSH: 0.58 (ref 0.41–5.90)

## 2017-06-01 LAB — LIPID PANEL
CHOLESTEROL: 158 (ref 0–200)
HDL: 36 (ref 35–70)
LDL Cholesterol: 105
Triglycerides: 83 (ref 40–160)

## 2017-06-01 LAB — HEMOGLOBIN A1C: Hemoglobin A1C: 5.2

## 2017-06-05 LAB — BASIC METABOLIC PANEL
BUN: 21 (ref 4–21)
CREATININE: 0.5 — AB (ref 0.6–1.3)
Glucose: 100
Potassium: 3.9 (ref 3.4–5.3)
Sodium: 137 (ref 137–147)

## 2017-06-08 ENCOUNTER — Telehealth: Payer: Self-pay

## 2017-06-08 NOTE — Telephone Encounter (Signed)
Called patient to try to schedule AWV in the office. No answer-left voicemail to call back.    

## 2017-06-11 LAB — BASIC METABOLIC PANEL
BUN: 17 (ref 4–21)
Creatinine: 0.6 (ref 0.6–1.3)
Glucose: 127
POTASSIUM: 3.8 (ref 3.4–5.3)
SODIUM: 135 — AB (ref 137–147)

## 2017-06-19 LAB — CBC AND DIFFERENTIAL
HEMATOCRIT: 40 — AB (ref 41–53)
HEMOGLOBIN: 13.5 (ref 13.5–17.5)
Platelets: 351 (ref 150–399)
WBC: 8.5

## 2017-06-20 LAB — BASIC METABOLIC PANEL
BUN: 14 (ref 4–21)
CREATININE: 0.7 (ref 0.6–1.3)
GLUCOSE: 93
Potassium: 4 (ref 3.4–5.3)
SODIUM: 138 (ref 137–147)

## 2017-06-21 LAB — BASIC METABOLIC PANEL
BUN: 15 (ref 4–21)
Creatinine: 0.6 (ref 0.6–1.3)
GLUCOSE: 85
Potassium: 4 (ref 3.4–5.3)
Sodium: 137 (ref 137–147)

## 2017-06-21 LAB — CBC AND DIFFERENTIAL
HCT: 37 — AB (ref 41–53)
Hemoglobin: 12.3 — AB (ref 13.5–17.5)
Platelets: 304 (ref 150–399)
WBC: 5.3

## 2017-06-21 LAB — HEPATIC FUNCTION PANEL
ALT: 37 (ref 10–40)
AST: 24 (ref 14–40)
Alkaline Phosphatase: 52 (ref 25–125)
BILIRUBIN, TOTAL: 0.3

## 2017-06-25 ENCOUNTER — Emergency Department (HOSPITAL_COMMUNITY): Payer: PPO

## 2017-06-25 ENCOUNTER — Inpatient Hospital Stay (HOSPITAL_COMMUNITY)
Admission: EM | Admit: 2017-06-25 | Discharge: 2017-07-24 | DRG: 884 | Disposition: A | Discharge status: Skilled Nursing Facility | Payer: PPO | Attending: Internal Medicine | Admitting: Internal Medicine

## 2017-06-25 ENCOUNTER — Other Ambulatory Visit: Payer: Self-pay

## 2017-06-25 ENCOUNTER — Encounter (HOSPITAL_COMMUNITY): Payer: Self-pay | Admitting: Emergency Medicine

## 2017-06-25 DIAGNOSIS — R413 Other amnesia: Secondary | ICD-10-CM | POA: Diagnosis present

## 2017-06-25 DIAGNOSIS — R296 Repeated falls: Secondary | ICD-10-CM | POA: Diagnosis present

## 2017-06-25 DIAGNOSIS — F419 Anxiety disorder, unspecified: Secondary | ICD-10-CM | POA: Diagnosis present

## 2017-06-25 DIAGNOSIS — Z751 Person awaiting admission to adequate facility elsewhere: Secondary | ICD-10-CM

## 2017-06-25 DIAGNOSIS — Z9181 History of falling: Secondary | ICD-10-CM | POA: Diagnosis not present

## 2017-06-25 DIAGNOSIS — N390 Urinary tract infection, site not specified: Secondary | ICD-10-CM | POA: Diagnosis present

## 2017-06-25 DIAGNOSIS — E785 Hyperlipidemia, unspecified: Secondary | ICD-10-CM | POA: Diagnosis not present

## 2017-06-25 DIAGNOSIS — Z888 Allergy status to other drugs, medicaments and biological substances status: Secondary | ICD-10-CM

## 2017-06-25 DIAGNOSIS — F1290 Cannabis use, unspecified, uncomplicated: Secondary | ICD-10-CM | POA: Diagnosis not present

## 2017-06-25 DIAGNOSIS — F0281 Dementia in other diseases classified elsewhere with behavioral disturbance: Secondary | ICD-10-CM | POA: Diagnosis not present

## 2017-06-25 DIAGNOSIS — F0391 Unspecified dementia with behavioral disturbance: Principal | ICD-10-CM | POA: Diagnosis present

## 2017-06-25 DIAGNOSIS — H4720 Unspecified optic atrophy: Secondary | ICD-10-CM | POA: Diagnosis not present

## 2017-06-25 DIAGNOSIS — R451 Restlessness and agitation: Secondary | ICD-10-CM | POA: Diagnosis not present

## 2017-06-25 DIAGNOSIS — H6123 Impacted cerumen, bilateral: Secondary | ICD-10-CM | POA: Diagnosis present

## 2017-06-25 DIAGNOSIS — I10 Essential (primary) hypertension: Secondary | ICD-10-CM | POA: Diagnosis not present

## 2017-06-25 DIAGNOSIS — Z66 Do not resuscitate: Secondary | ICD-10-CM | POA: Diagnosis not present

## 2017-06-25 DIAGNOSIS — I6389 Other cerebral infarction: Secondary | ICD-10-CM | POA: Diagnosis not present

## 2017-06-25 DIAGNOSIS — Z781 Physical restraint status: Secondary | ICD-10-CM

## 2017-06-25 DIAGNOSIS — K5900 Constipation, unspecified: Secondary | ICD-10-CM | POA: Diagnosis not present

## 2017-06-25 DIAGNOSIS — F17210 Nicotine dependence, cigarettes, uncomplicated: Secondary | ICD-10-CM | POA: Diagnosis not present

## 2017-06-25 DIAGNOSIS — Z7982 Long term (current) use of aspirin: Secondary | ICD-10-CM

## 2017-06-25 DIAGNOSIS — M542 Cervicalgia: Secondary | ICD-10-CM | POA: Diagnosis not present

## 2017-06-25 DIAGNOSIS — Z79899 Other long term (current) drug therapy: Secondary | ICD-10-CM

## 2017-06-25 DIAGNOSIS — G309 Alzheimer's disease, unspecified: Secondary | ICD-10-CM | POA: Diagnosis not present

## 2017-06-25 DIAGNOSIS — H547 Unspecified visual loss: Secondary | ICD-10-CM

## 2017-06-25 DIAGNOSIS — F329 Major depressive disorder, single episode, unspecified: Secondary | ICD-10-CM | POA: Diagnosis present

## 2017-06-25 DIAGNOSIS — R456 Violent behavior: Secondary | ICD-10-CM | POA: Diagnosis not present

## 2017-06-25 DIAGNOSIS — F03918 Unspecified dementia, unspecified severity, with other behavioral disturbance: Secondary | ICD-10-CM | POA: Diagnosis present

## 2017-06-25 DIAGNOSIS — N400 Enlarged prostate without lower urinary tract symptoms: Secondary | ICD-10-CM | POA: Diagnosis present

## 2017-06-25 DIAGNOSIS — F29 Unspecified psychosis not due to a substance or known physiological condition: Secondary | ICD-10-CM | POA: Diagnosis not present

## 2017-06-25 DIAGNOSIS — F05 Delirium due to known physiological condition: Secondary | ICD-10-CM | POA: Diagnosis present

## 2017-06-25 DIAGNOSIS — B961 Klebsiella pneumoniae [K. pneumoniae] as the cause of diseases classified elsewhere: Secondary | ICD-10-CM | POA: Diagnosis not present

## 2017-06-25 DIAGNOSIS — G4700 Insomnia, unspecified: Secondary | ICD-10-CM | POA: Diagnosis present

## 2017-06-25 DIAGNOSIS — R319 Hematuria, unspecified: Secondary | ICD-10-CM | POA: Diagnosis not present

## 2017-06-25 LAB — URINALYSIS, ROUTINE W REFLEX MICROSCOPIC
Bilirubin Urine: NEGATIVE
GLUCOSE, UA: NEGATIVE mg/dL
Ketones, ur: NEGATIVE mg/dL
Nitrite: POSITIVE — AB
PROTEIN: NEGATIVE mg/dL
SQUAMOUS EPITHELIAL / LPF: NONE SEEN
Specific Gravity, Urine: 1.012 (ref 1.005–1.030)
pH: 7 (ref 5.0–8.0)

## 2017-06-25 LAB — ETHANOL: Alcohol, Ethyl (B): <10 mg/dL (ref <10)

## 2017-06-25 LAB — CBC WITH DIFFERENTIAL/PLATELET
BASOS ABS: 0 10*3/uL (ref 0.0–0.1)
BASOS PCT: 0 %
EOS ABS: 0.2 10*3/uL (ref 0.0–0.7)
EOS PCT: 2 %
HCT: 37 % — ABNORMAL LOW (ref 39.0–52.0)
Hemoglobin: 13.2 g/dL (ref 13.0–17.0)
Lymphocytes Relative: 16 %
Lymphs Abs: 1.5 10*3/uL (ref 0.7–4.0)
MCH: 35 pg — ABNORMAL HIGH (ref 26.0–34.0)
MCHC: 35.7 g/dL (ref 30.0–36.0)
MCV: 98.1 fL (ref 78.0–100.0)
MONO ABS: 0.8 10*3/uL (ref 0.1–1.0)
Monocytes Relative: 9 %
Neutro Abs: 7 10*3/uL (ref 1.7–7.7)
Neutrophils Relative %: 73 %
PLATELETS: 307 10*3/uL (ref 150–400)
RBC: 3.77 MIL/uL — ABNORMAL LOW (ref 4.22–5.81)
RDW: 13.5 % (ref 11.5–15.5)
WBC: 9.7 10*3/uL (ref 4.0–10.5)

## 2017-06-25 LAB — COMPREHENSIVE METABOLIC PANEL
ALBUMIN: 3.1 g/dL — AB (ref 3.5–5.0)
ALK PHOS: 53 U/L (ref 38–126)
ALT: 20 U/L (ref 17–63)
AST: 27 U/L (ref 15–41)
Anion gap: 7 (ref 5–15)
BILIRUBIN TOTAL: 0.6 mg/dL (ref 0.3–1.2)
BUN: 11 mg/dL (ref 6–20)
CO2: 26 mmol/L (ref 22–32)
CREATININE: 0.58 mg/dL — AB (ref 0.61–1.24)
Calcium: 8.6 mg/dL — ABNORMAL LOW (ref 8.9–10.3)
Chloride: 105 mmol/L (ref 101–111)
GFR calc Af Amer: >60 mL/min (ref >60)
GLUCOSE: 85 mg/dL (ref 65–99)
POTASSIUM: 4.2 mmol/L (ref 3.5–5.1)
Sodium: 138 mmol/L (ref 135–145)
TOTAL PROTEIN: 6.4 g/dL — AB (ref 6.5–8.1)

## 2017-06-25 LAB — CBG MONITORING, ED: Glucose-Capillary: 91 mg/dL (ref 65–99)

## 2017-06-25 LAB — VALPROIC ACID LEVEL: VALPROIC ACID LVL: 31 ug/mL — AB (ref 50.0–100.0)

## 2017-06-25 LAB — RAPID URINE DRUG SCREEN, HOSP PERFORMED
AMPHETAMINES: NOT DETECTED
Barbiturates: NOT DETECTED
Benzodiazepines: NOT DETECTED
COCAINE: NOT DETECTED
OPIATES: NOT DETECTED
Tetrahydrocannabinol: NOT DETECTED

## 2017-06-25 MED ORDER — HALOPERIDOL LACTATE 5 MG/ML IJ SOLN
5.0000 mg | Freq: Four times a day (QID) | INTRAMUSCULAR | Status: DC | PRN
  Administered 2017-06-25 – 2017-07-01 (×2): 5 mg via INTRAMUSCULAR
  Filled 2017-06-25 (×4): qty 1

## 2017-06-25 MED ORDER — DIVALPROEX SODIUM 125 MG PO CSDR
250.0000 mg | DELAYED_RELEASE_CAPSULE | Freq: Every day | ORAL | Status: DC
  Administered 2017-06-26 – 2017-07-24 (×29): 250 mg via ORAL
  Filled 2017-06-25 (×30): qty 2

## 2017-06-25 MED ORDER — DEXTROSE 5 % IV SOLN
1.0000 g | INTRAVENOUS | Status: DC
  Administered 2017-06-25 – 2017-06-29 (×5): 1 g via INTRAVENOUS
  Filled 2017-06-25 (×6): qty 10

## 2017-06-25 MED ORDER — VITAMIN D3 25 MCG (1000 UNIT) PO TABS
1000.0000 [IU] | ORAL_TABLET | Freq: Every day | ORAL | Status: DC
  Administered 2017-06-26 – 2017-07-24 (×29): 1000 [IU] via ORAL
  Filled 2017-06-25 (×30): qty 1

## 2017-06-25 MED ORDER — HALOPERIDOL 1 MG PO TABS
2.0000 mg | ORAL_TABLET | Freq: Three times a day (TID) | ORAL | Status: DC
  Administered 2017-06-25 – 2017-07-01 (×19): 2 mg via ORAL
  Filled 2017-06-25 (×17): qty 2
  Filled 2017-06-25: qty 1
  Filled 2017-06-25 (×3): qty 2

## 2017-06-25 MED ORDER — ACETAMINOPHEN 325 MG PO TABS
650.0000 mg | ORAL_TABLET | Freq: Four times a day (QID) | ORAL | Status: DC | PRN
  Administered 2017-07-02 – 2017-07-15 (×8): 650 mg via ORAL
  Filled 2017-06-25 (×8): qty 2

## 2017-06-25 MED ORDER — FINASTERIDE 5 MG PO TABS
5.0000 mg | ORAL_TABLET | Freq: Every day | ORAL | Status: DC
  Administered 2017-06-25 – 2017-07-24 (×30): 5 mg via ORAL
  Filled 2017-06-25 (×31): qty 1

## 2017-06-25 MED ORDER — SODIUM CHLORIDE 0.9 % IV BOLUS (SEPSIS)
1000.0000 mL | Freq: Once | INTRAVENOUS | Status: AC
Start: 2017-06-25 — End: 2017-06-25
  Administered 2017-06-25: 1000 mL via INTRAVENOUS

## 2017-06-25 MED ORDER — TAMSULOSIN HCL 0.4 MG PO CAPS
0.4000 mg | ORAL_CAPSULE | Freq: Every day | ORAL | Status: DC
  Administered 2017-06-25 – 2017-07-24 (×29): 0.4 mg via ORAL
  Filled 2017-06-25 (×30): qty 1

## 2017-06-25 MED ORDER — DIVALPROEX SODIUM 125 MG PO CSDR: 250.0000 mg | DELAYED_RELEASE_CAPSULE | Freq: Two times a day (BID) | ORAL | Status: DC

## 2017-06-25 MED ORDER — ASPIRIN EC 81 MG PO TBEC
81.0000 mg | DELAYED_RELEASE_TABLET | Freq: Every day | ORAL | Status: DC
  Administered 2017-06-26 – 2017-07-24 (×29): 81 mg via ORAL
  Filled 2017-06-25 (×29): qty 1

## 2017-06-25 MED ORDER — ENOXAPARIN SODIUM 40 MG/0.4ML ~~LOC~~ SOLN
40.0000 mg | SUBCUTANEOUS | Status: DC
  Administered 2017-06-25 – 2017-07-24 (×29): 40 mg via SUBCUTANEOUS
  Filled 2017-06-25 (×29): qty 0.4

## 2017-06-25 MED ORDER — LISINOPRIL 20 MG PO TABS
20.0000 mg | ORAL_TABLET | Freq: Every day | ORAL | Status: DC
  Administered 2017-06-25 – 2017-07-24 (×28): 20 mg via ORAL
  Filled 2017-06-25 (×30): qty 1

## 2017-06-25 MED ORDER — LORAZEPAM 2 MG/ML IJ SOLN
1.0000 mg | Freq: Once | INTRAMUSCULAR | Status: AC
  Administered 2017-06-25: 1 mg via INTRAVENOUS
  Filled 2017-06-25: qty 1

## 2017-06-25 MED ORDER — TRAZODONE HCL 50 MG PO TABS
50.0000 mg | ORAL_TABLET | Freq: Every day | ORAL | Status: DC
  Administered 2017-06-25 – 2017-07-23 (×28): 50 mg via ORAL
  Filled 2017-06-25 (×28): qty 1

## 2017-06-25 MED ORDER — MAGNESIUM 30 MG PO TABS: 30.0000 mg | ORAL_TABLET | ORAL | Status: DC

## 2017-06-25 MED ORDER — LORAZEPAM 0.5 MG PO TABS
0.5000 mg | ORAL_TABLET | Freq: Every day | ORAL | Status: DC | PRN
  Administered 2017-06-26 – 2017-06-27 (×2): 0.5 mg via ORAL
  Filled 2017-06-25 (×2): qty 1

## 2017-06-25 MED ORDER — CITALOPRAM HYDROBROMIDE 10 MG PO TABS
10.0000 mg | ORAL_TABLET | Freq: Every day | ORAL | Status: DC
  Administered 2017-06-25 – 2017-07-24 (×30): 10 mg via ORAL
  Filled 2017-06-25 (×30): qty 1

## 2017-06-25 MED ORDER — DIVALPROEX SODIUM 125 MG PO CSDR
500.0000 mg | DELAYED_RELEASE_CAPSULE | Freq: Every day | ORAL | Status: DC
  Administered 2017-06-25 – 2017-07-23 (×28): 500 mg via ORAL
  Filled 2017-06-25 (×30): qty 4

## 2017-06-25 NOTE — ED Notes (Signed)
Refused in and out cath, states that he knows when he has to use the restroom and he will yell it
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
## 2017-06-25 NOTE — ED Notes (Signed)
Armband on end of bed, patient refused it on arm.

## 2017-06-25 NOTE — ED Notes (Signed)
Bed: WA29 Expected date:  Expected time:  Means of arrival:  Comments: 67 yo IVC, combative

## 2017-06-25 NOTE — ED Notes (Signed)
Bed alarm on.
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
## 2017-06-25 NOTE — ED Notes (Signed)
Patient pulled out IV.

## 2017-06-25 NOTE — ED Notes (Signed)
ED TO INPATIENT HANDOFF REPORT  Name/Age/Gender Joe Dawson 67 y.o. male  Code Status    Code Status Orders  (From admission, onward)        Start     Ordered   06/25/17 1231  Do not attempt resuscitation (DNR)  Continuous    Question Answer Comment  In the event of cardiac or respiratory ARREST Do not call a "code blue"   In the event of cardiac or respiratory ARREST Do not perform Intubation, CPR, defibrillation or ACLS   In the event of cardiac or respiratory ARREST Use medication by any route, position, wound care, and other measures to relive pain and suffering. May use oxygen, suction and manual treatment of airway obstruction as needed for comfort.      06/25/17 1231    Code Status History    Date Active Date Inactive Code Status Order ID Comments User Context   05/28/2017 02:56 05/31/2017 23:49 Full Code [id number]  Joe Fraise, MD ED      Home/SNF/Other Home  Chief Complaint fall  Level of Care/Admitting Diagnosis ED Disposition    ED Disposition Condition [place]: [facility] [100102]  Level of Care: Med-Surg [16]  Diagnosis: UTI (urinary tract infection) [299242]  Admitting Physician: [name] [[id number]]  Attending Physician: [name] [[id number]]  Estimated length of stay: past midnight tomorrow  Certification:: I certify this patient will need inpatient services for at least 2 midnights  PT Class (Do Not Modify): Inpatient [101]  PT Acc Code (Do Not Modify): Private [1]       Medical History Past Medical History:  Diagnosis Date  . Arthritis   . Blindness - both eyes   . BPH (benign prostatic hyperplasia)   . Hyperlipidemia   . Hypertension   . Tobacco abuse     Allergies Allergies  Allergen Reactions  . Epinephrine     Legs shake    IV Location/Drains/Wounds Patient Lines/Drains/Airways Status   Active Line/Drains/Airways    Name:   Placement date:   Placement time:   Site:    Days:   Peripheral IV 06/25/17 Right Antecubital   06/25/17    1117    Antecubital   less than 1          Labs/Imaging Results for orders placed or performed during the hospital encounter of 06/25/17 (from the past 48 hour(s))  CBG monitoring, ED     Status: None   Collection Time: 06/25/17  8:29 AM  Result Value Ref Range   Glucose-Capillary 91 65 - 99 mg/dL  CBC with Differential/Platelet     Status: Abnormal   Collection Time: 06/25/17  8:52 AM  Result Value Ref Range   WBC 9.7 4.0 - 10.5 K/uL   RBC 3.77 (L) 4.22 - 5.81 MIL/uL   Hemoglobin 13.2 13.0 - 17.0 g/dL   HCT 37.0 (L) 39.0 - 52.0 %   MCV 98.1 78.0 - 100.0 fL   MCH 35.0 (H) 26.0 - 34.0 pg   MCHC 35.7 30.0 - 36.0 g/dL   RDW 13.5 11.5 - 15.5 %   Platelets 307 150 - 400 K/uL   Neutrophils Relative % 73 %   Neutro Abs 7.0 1.7 - 7.7 K/uL   Lymphocytes Relative 16 %   Lymphs Abs 1.5 0.7 - 4.0 K/uL   Monocytes Relative 9 %   Monocytes Absolute 0.8 0.1 - 1.0 K/uL   Eosinophils Relative 2 %  Eosinophils Absolute 0.2 0.0 - 0.7 K/uL   Basophils Relative 0 %   Basophils Absolute 0.0 0.0 - 0.1 K/uL    Comment: Performed at [facility], [place] [address], [place], [place] [address]  Urinalysis, Routine w reflex microscopic     Status: Abnormal   Collection Time: 06/25/17  9:10 AM  Result Value Ref Range   Color, Urine YELLOW YELLOW   APPearance HAZY (A) CLEAR   Specific Gravity, Urine 1.012 1.005 - 1.030   pH 7.0 5.0 - 8.0   Glucose, UA NEGATIVE NEGATIVE mg/dL   Hgb urine dipstick SMALL (A) NEGATIVE   Bilirubin Urine NEGATIVE NEGATIVE   Ketones, ur NEGATIVE NEGATIVE mg/dL   Protein, ur NEGATIVE NEGATIVE mg/dL   Nitrite POSITIVE (A) NEGATIVE   Leukocytes, UA MODERATE (A) NEGATIVE   RBC / HPF 6-30 0 - 5 RBC/hpf   WBC, UA TOO NUMEROUS TO COUNT 0 - 5 WBC/hpf   Bacteria, UA FEW (A) NONE SEEN   Squamous Epithelial / LPF NONE SEEN NONE SEEN   Mucus PRESENT    Amorphous Crystal PRESENT    Non Squamous  Epithelial 0-5 (A) NONE SEEN    Comment: Performed at [facility], [place] [address], [place], [place] [address]  Rapid urine drug screen (hospital performed)     Status: None   Collection Time: 06/25/17  9:10 AM  Result Value Ref Range   Opiates NONE DETECTED NONE DETECTED   Cocaine NONE DETECTED NONE DETECTED   Benzodiazepines NONE DETECTED NONE DETECTED   Amphetamines NONE DETECTED NONE DETECTED   Tetrahydrocannabinol NONE DETECTED NONE DETECTED   Barbiturates NONE DETECTED NONE DETECTED    Comment: (NOTE) DRUG SCREEN FOR MEDICAL PURPOSES ONLY.  IF CONFIRMATION IS NEEDED FOR ANY PURPOSE, NOTIFY LAB WITHIN 5 DAYS. LOWEST DETECTABLE LIMITS FOR URINE DRUG SCREEN Drug Class                     Cutoff (ng/mL) Amphetamine and metabolites    1000 Barbiturate and metabolites    200 Benzodiazepine                 970 Tricyclics and metabolites     300 Opiates and metabolites        300 Cocaine and metabolites        300 THC                            50 Performed at [facility], [place] [address] [place], [place] [address]   Ethanol     Status: None   Collection Time: 06/25/17  9:45 AM  Result Value Ref Range   Alcohol, Ethyl (B) <10 <10 mg/dL    Comment:        LOWEST DETECTABLE LIMIT FOR SERUM ALCOHOL IS 10 mg/dL FOR MEDICAL PURPOSES ONLY Performed at [place] [address], [place],  [address]   Comprehensive metabolic panel     Status: Abnormal   Collection Time: 06/25/17  9:45 AM  Result Value Ref Range   Sodium 138 135 - 145 mmol/L   Potassium 4.2 3.5 - 5.1 mmol/L    Comment: SLIGHT HEMOLYSIS   Chloride 105 101 - 111 mmol/L   CO2 26 22 - 32 mmol/L   Glucose, Bld 85 65 - 99 mg/dL   BUN 11 6 - 20 mg/dL   Creatinine, Ser 0.58 (L) 0.61 - 1.24  mg/dL   Calcium 8.6 (L) 8.9 - 10.3 mg/dL   Total Protein 6.4 (L) 6.5 - 8.1 g/dL   Albumin 3.1 (L) 3.5 - 5.0 g/dL   AST 27 15 - 41 U/L   ALT 20 17 - 63 U/L    Alkaline Phosphatase 53 38 - 126 U/L   Total Bilirubin 0.6 0.3 - 1.2 mg/dL   GFR calc non Af Amer >60 >60 mL/min   GFR calc Af Amer >60 >60 mL/min    Comment: (NOTE) The eGFR has been calculated using the CKD EPI equation. This calculation has not been validated in all clinical situations. eGFR's persistently <60 mL/min signify possible Chronic Kidney Disease.    Anion gap 7 5 - 15    Comment: Performed at [facility], [place] [address], [place], [place] [address]  Valproic acid level     Status: Abnormal   Collection Time: 06/25/17  9:45 AM  Result Value Ref Range   Valproic Acid Lvl 31 (L) 50.0 - 100.0 ug/mL    Comment: Performed at [facility], [place] [address] [place], [place] [address]   Ct Head Wo Contrast  Result Date: 06/25/2017 CLINICAL DATA:  Neck pain. EXAM: CT HEAD WITHOUT CONTRAST CT CERVICAL SPINE WITHOUT CONTRAST TECHNIQUE: Multidetector CT imaging of the head and cervical spine was performed following the standard protocol without intravenous contrast. Multiplanar CT image reconstructions of the cervical spine were also generated. COMPARISON:  None. FINDINGS: CT HEAD FINDINGS Brain: No evidence of acute infarction, hemorrhage, hydrocephalus, extra-axial collection or mass lesion/mass effect. Moderate brain parenchymal volume loss. Mild deep white matter microangiopathy. Small lacunar infarcts in bilateral basal ganglia. Vascular: Calcific atherosclerotic disease at the skull base. Skull: Normal. Negative for fracture or focal lesion. Sinuses/Orbits: Minimal opacification of the ethmoid air cells. Other: None CT CERVICAL SPINE FINDINGS Alignment: Reversal of cervical lordosis. Skull base and vertebrae: No acute fracture. 11 mm sclerotic focus within the base of the dense. Soft tissues and spinal canal: No prevertebral fluid or swelling. No visible canal hematoma. Disc levels: Multilevel osteoarthritic changes with disc space narrowing, remodeling  of vertebral bodies, osteophyte formation and posterior facet arthropathy. Likely degenerative reversal of cervical lordosis centered at C5-C6. Upper chest: Mild paraseptal emphysema. Other: None. IMPRESSION: No acute intracranial abnormality. Atrophy, chronic microvascular disease. 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 No evidence of acute traumatic injury to the cervical spine. Multilevel osteoarthritic changes of the cervical spine with reversal of cervical lordosis. Single sclerotic 11 mm focus within the dense. In the absence of malignancy capable of producing sclerotic lesions this may represent a bone island. Osteolytic versus chronic osteoarthritic changes at the left sternoclavicular joint. Please correlate clinically. Electronically Signed   By: Fidela Salisbury M.D.   On: 06/25/2017 09:09   Ct Cervical Spine Wo Contrast  Result Date: 06/25/2017 CLINICAL DATA:  Neck pain. EXAM: CT HEAD WITHOUT CONTRAST CT CERVICAL SPINE WITHOUT CONTRAST TECHNIQUE: Multidetector CT imaging of the head and cervical spine was performed following the standard protocol without intravenous contrast. Multiplanar CT image reconstructions of the cervical spine were also generated. COMPARISON:  None. FINDINGS: CT HEAD FINDINGS Brain: No evidence of acute infarction, hemorrhage, hydrocephalus, extra-axial collection or mass lesion/mass effect. Moderate brain parenchymal volume loss. Mild deep white matter microangiopathy. Small lacunar infarcts in bilateral basal ganglia. Vascular: Calcific atherosclerotic disease at the skull base. Skull: Normal. Negative for fracture or focal lesion. Sinuses/Orbits: Minimal opacification of the ethmoid air cells. Other: None  CT CERVICAL SPINE FINDINGS Alignment: Reversal of cervical lordosis. Skull base and vertebrae: No acute fracture. 11 mm sclerotic focus within the base of the dense. Soft tissues and spinal canal: No prevertebral fluid or swelling. No visible  canal hematoma. Disc levels: Multilevel osteoarthritic changes with disc space narrowing, remodeling of vertebral bodies, osteophyte formation and posterior facet arthropathy. Likely degenerative reversal of cervical lordosis centered at C5-C6. Upper chest: Mild paraseptal emphysema. Other: None. IMPRESSION: No acute intracranial abnormality. Atrophy, chronic microvascular disease. 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 No evidence of acute traumatic injury to the cervical spine. Multilevel osteoarthritic changes of the cervical spine with reversal of cervical lordosis. Single sclerotic 11 mm focus within the dense. In the absence of malignancy capable of producing sclerotic lesions this may represent a bone island. Osteolytic versus chronic osteoarthritic changes at the left sternoclavicular joint. Please correlate clinically. Electronically Signed   By: Fidela Salisbury M.D.   On: 06/25/2017 09:09    Pending Labs Unresulted Labs (From admission, onward)   Start     Ordered   06/25/17 0815  Urine Culture  STAT,   STAT     06/25/17 0815      Vitals/Pain Today's Vitals   06/25/17 0816 06/25/17 1118  BP: 132/77 136/76  Pulse: (!) 59 (!) 58  Resp: 18 18  Temp: 98.4 F (36.9 C)   TempSrc: Oral   SpO2: 100% 99%    Isolation Precautions No active isolations  Medications Medications  cefTRIAXone (ROCEPHIN) 1 g in dextrose 5 % 50 mL IVPB (0 g Intravenous Stopped 06/25/17 1116)  haloperidol lactate (HALDOL) injection 5 mg (5 mg Intramuscular Given 06/25/17 1228)  acetaminophen (TYLENOL) tablet 650 mg (not administered)  aspirin tablet 81 mg (not administered)  cholecalciferol (VITAMIN D) tablet 1,000 Units (not administered)  citalopram (CELEXA) tablet 10 mg (not administered)  divalproex (DEPAKOTE SPRINKLE) capsule 250-500 mg (not administered)  finasteride (PROSCAR) tablet 5 mg (not administered)  haloperidol (HALDOL) tablet 2 mg (not administered)  lisinopril  (PRINIVIL,ZESTRIL) tablet 20 mg (not administered)  LORazepam (ATIVAN) tablet 0.5 mg (not administered)  magnesium tablet 30 mg (not administered)  tamsulosin (FLOMAX) capsule 0.4 mg (not administered)  traZODone (DESYREL) tablet 50 mg (not administered)  enoxaparin (LOVENOX) injection 40 mg (not administered)  sodium chloride 0.9 % bolus 1,000 mL (0 mLs Intravenous Stopped 06/25/17 1116)    Mobility Ambulatory with 2 assist

## 2017-06-25 NOTE — ED Notes (Signed)
Patient is now IVC'd

## 2017-06-25 NOTE — ED Notes (Signed)
Patient agitated, trying to get out of bed.

## 2017-06-26 ENCOUNTER — Ambulatory Visit: Payer: PPO | Admitting: Internal Medicine

## 2017-06-26 DIAGNOSIS — I10 Essential (primary) hypertension: Secondary | ICD-10-CM

## 2017-06-26 DIAGNOSIS — R319 Hematuria, unspecified: Secondary | ICD-10-CM

## 2017-06-26 DIAGNOSIS — H4720 Unspecified optic atrophy: Secondary | ICD-10-CM

## 2017-06-26 DIAGNOSIS — R451 Restlessness and agitation: Secondary | ICD-10-CM

## 2017-06-26 DIAGNOSIS — N390 Urinary tract infection, site not specified: Secondary | ICD-10-CM

## 2017-06-26 DIAGNOSIS — G309 Alzheimer's disease, unspecified: Secondary | ICD-10-CM

## 2017-06-26 DIAGNOSIS — F1290 Cannabis use, unspecified, uncomplicated: Secondary | ICD-10-CM

## 2017-06-26 DIAGNOSIS — F17210 Nicotine dependence, cigarettes, uncomplicated: Secondary | ICD-10-CM

## 2017-06-26 DIAGNOSIS — F0281 Dementia in other diseases classified elsewhere with behavioral disturbance: Secondary | ICD-10-CM

## 2017-06-26 DIAGNOSIS — F29 Unspecified psychosis not due to a substance or known physiological condition: Secondary | ICD-10-CM

## 2017-06-26 MED ORDER — LORAZEPAM 2 MG/ML IJ SOLN
1.0000 mg | Freq: Once | INTRAMUSCULAR | Status: AC
  Administered 2017-06-26: 20:00:00 1 mg via INTRAVENOUS
  Filled 2017-06-26: qty 1

## 2017-06-26 MED ORDER — LORAZEPAM 2 MG/ML IJ SOLN
1.0000 mg | Freq: Once | INTRAMUSCULAR | Status: AC
  Administered 2017-07-04: 08:00:00 1 mg via INTRAVENOUS
  Filled 2017-06-26: qty 1
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
## 2017-06-26 NOTE — Progress Notes (Signed)
PROGRESS NOTE  Joe Dawson  GXQ:[id number] DOB: 24-Jun-1950 DOA: 06/25/2017 PCP: Gayland Curry, DO   Brief Narrative: Joe Dawson is a 67 y.o. male with a history of optic nerve atrophy with blindness, dementia with behavioral disturbances, HTN, BPH, DJD, gait instability/falls, and tobacco use who was brought to the ED by his wife for increased agitation and aggressive behavior, IVC'ed in th ED. He had nitrite-positive pyuria, started on ceftriaxone with urine culture pending. Psychiatry was consulted, recommending disposition to locked dementia unit. Social work consulted.   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 Had similar presentation, admitted to [facility] 1/9 - 1/30.  - Psychiatry consulted, recommend continuing home psychotropic medications.  - CSW consulted for placement in memory care unit.  Nitrite-positive pyuria: Suspect UTI, though pt does not admit to symptoms of UTI.  - Continue empiric ceftriaxone pending urine culture.   BPH: Chronic, stable. Maintaining good UOP.  - Continue finasteride, flomax  HTN: Chronic, stable - Continue lisinopril  DVT prophylaxis: Lovenox Code Status: DNR Family Communication: None at bedside Disposition Plan: Memory care unit, likely in next 24 hours if bed available.   Consultants:   Psychiatry, Dr. Mariea Clonts.   Procedures:   None  Antimicrobials:  Ceftriaxone   Subjective: Not oriented, denies pain. Sitter at bedside without reported agitation this AM.   Objective: Vitals:   06/25/17 2017 06/26/17 0620 06/26/17 1008 06/26/17 1320  BP: 138/74 119/64 122/76 120/72  Pulse: 75 (!) 57 66 (!) 59  Resp: 15 16  15   Temp: (!) 97.5 F  (36.4 C) 97.7 F (36.5 C)  98.3 F (36.8 C)  TempSrc: Oral Oral  Oral  SpO2: 95% 96%  97%  Weight:      Height:        Intake/Output Summary (Last 24 hours) at 06/26/2017 1428 Last data filed at 06/26/2017 1311 Gross per 24 hour  Intake 1500 ml  Output 780 ml  Net 720 ml   Filed Weights   06/25/17 1321  Weight: 65.8 kg (145 lb)    Gen: 67 y.o. male in no distress  Pulm: Non-labored breathing. Clear to auscultation bilaterally.  CV: Regular rate and rhythm. No murmur, rub, or gallop. No JVD, no pedal edema. GI: Abdomen soft, non-tender, non-distended, with normoactive bowel sounds. No organomegaly or masses felt. Ext: Warm, no deformities Skin: No rashes, lesions no ulcers Neuro: Alert, oriented to name only, does not know how old he is, where he is, when it is, or situation. Blind. Moving all extremities.  Psych: Judgement and insight appear impaired by cognitive impairment. Denies SI/HI.  Data Reviewed: I have personally reviewed following labs and imaging studies  CBC: Recent Labs  Lab 06/25/17 0852  WBC 9.7  NEUTROABS 7.0  HGB 13.2  HCT 37.0*  MCV 98.1  PLT 144   Basic Metabolic Panel: Recent Labs  Lab 06/25/17 0945  NA 138  K 4.2  CL 105  CO2 26  GLUCOSE 85  BUN 11  CREATININE 0.58*  CALCIUM 8.6*   GFR: Estimated Creatinine Clearance: 83.4 mL/min (A) (by C-G formula based on SCr of 0.58 mg/dL (L)). Liver Function Tests: Recent Labs  Lab 06/25/17 0945  AST 27  ALT 20  ALKPHOS 53  BILITOT 0.6  PROT 6.4*  ALBUMIN 3.1*   No results for input(s): LIPASE, AMYLASE in the last 168 hours. No results for input(s): AMMONIA in the last 168 hours. Coagulation Profile: No results for input(s): INR, PROTIME in the last 168 hours. Cardiac Enzymes: No results for input(s): CKTOTAL, CKMB, CKMBINDEX, TROPONINI in the last 168 hours. BNP (last 3 results) No results for input(s): PROBNP in the last 8760 hours. HbA1C: No results for input(s): HGBA1C in the  last 72 hours. CBG: Recent Labs  Lab 06/25/17 0829  GLUCAP 91   Lipid Profile: No results for input(s): CHOL, HDL, LDLCALC, TRIG, CHOLHDL, LDLDIRECT in the last 72 hours. Thyroid Function Tests: No results for input(s): TSH, T4TOTAL, FREET4, T3FREE, THYROIDAB in the last 72 hours. Anemia Panel: No results for input(s): VITAMINB12, FOLATE, FERRITIN, TIBC, IRON, RETICCTPCT in the last 72 hours. Urine analysis:    Component Value Date/Time   COLORURINE YELLOW 06/25/2017 0910   APPEARANCEUR HAZY (A) 06/25/2017 0910   LABSPEC 1.012 06/25/2017 0910   PHURINE 7.0 06/25/2017 0910   GLUCOSEU NEGATIVE 06/25/2017 0910   HGBUR SMALL (A) 06/25/2017 0910   BILIRUBINUR NEGATIVE 06/25/2017 0910   KETONESUR NEGATIVE 06/25/2017 0910   PROTEINUR NEGATIVE 06/25/2017 0910   NITRITE POSITIVE (A) 06/25/2017 0910   LEUKOCYTESUR MODERATE (A) 06/25/2017 0910   No results found for this or any previous visit (from the past 240 hour(s)).    Radiology Studies: Ct Head Wo Contrast  Result Date: 06/25/2017 CLINICAL DATA:  Neck pain. EXAM: CT HEAD WITHOUT CONTRAST CT CERVICAL SPINE WITHOUT CONTRAST TECHNIQUE: Multidetector CT imaging of the head and cervical spine was performed following the standard protocol without intravenous contrast. Multiplanar CT image reconstructions of the cervical spine were also generated. COMPARISON:  None. FINDINGS: CT HEAD FINDINGS Brain: No evidence of acute infarction, hemorrhage, hydrocephalus, extra-axial collection or mass lesion/mass effect. Moderate brain parenchymal volume loss. Mild deep white matter microangiopathy. Small lacunar infarcts in bilateral basal ganglia. Vascular: Calcific atherosclerotic disease at the skull base. Skull: Normal. Negative for fracture or focal lesion. Sinuses/Orbits: Minimal opacification of the ethmoid air cells. Other: None CT CERVICAL SPINE FINDINGS Alignment: Reversal of cervical lordosis. Skull base and vertebrae: No acute fracture. 11 mm  sclerotic focus within the base of the dense. Soft tissues and spinal canal: No prevertebral fluid or swelling. No visible canal hematoma. Disc levels: Multilevel osteoarthritic changes with disc space narrowing, remodeling of vertebral bodies, osteophyte formation and posterior facet arthropathy. Likely degenerative reversal of cervical lordosis centered at C5-C6. Upper chest: Mild paraseptal emphysema. Other: None. IMPRESSION: No acute intracranial abnormality. Atrophy, chronic microvascular disease. 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 No evidence of acute traumatic injury to the cervical spine. Multilevel osteoarthritic changes of the cervical spine with reversal of cervical lordosis. Single sclerotic 11 mm focus within the dense. In the absence of malignancy capable of producing sclerotic lesions this may represent a bone island. Osteolytic versus chronic osteoarthritic changes at the left sternoclavicular joint. Please correlate clinically. Electronically Signed   By: Fidela Salisbury M.D.   On: 06/25/2017 09:09   Ct Cervical Spine Wo Contrast  Result Date: 06/25/2017 CLINICAL DATA:  Neck pain. EXAM: CT HEAD WITHOUT CONTRAST CT CERVICAL SPINE WITHOUT CONTRAST TECHNIQUE: Multidetector CT imaging of the head and cervical spine was performed following the standard protocol without intravenous contrast. Multiplanar CT image reconstructions of the cervical spine were also generated. COMPARISON:  None. FINDINGS: CT HEAD FINDINGS Brain: No evidence  of acute infarction, hemorrhage, hydrocephalus, extra-axial collection or mass lesion/mass effect. Moderate brain parenchymal volume loss. Mild deep white matter microangiopathy. Small lacunar infarcts in bilateral basal ganglia. Vascular: Calcific atherosclerotic disease at the skull base. Skull: Normal. Negative for fracture or focal lesion. Sinuses/Orbits: Minimal opacification of the ethmoid air cells. Other: None CT CERVICAL SPINE  FINDINGS Alignment: Reversal of cervical lordosis. Skull base and vertebrae: No acute fracture. 11 mm sclerotic focus within the base of the dense. Soft tissues and spinal canal: No prevertebral fluid or swelling. No visible canal hematoma. Disc levels: Multilevel osteoarthritic changes with disc space narrowing, remodeling of vertebral bodies, osteophyte formation and posterior facet arthropathy. Likely degenerative reversal of cervical lordosis centered at C5-C6. Upper chest: Mild paraseptal emphysema. Other: None. IMPRESSION: No acute intracranial abnormality. Atrophy, chronic microvascular disease. 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 No evidence of acute traumatic injury to the cervical spine. Multilevel osteoarthritic changes of the cervical spine with reversal of cervical lordosis. Single sclerotic 11 mm focus within the dense. In the absence of malignancy capable of producing sclerotic lesions this may represent a bone island. Osteolytic versus chronic osteoarthritic changes at the left sternoclavicular joint. Please correlate clinically. Electronically Signed   By: Fidela Salisbury M.D.   On: 06/25/2017 09:09    Scheduled Meds: . aspirin EC  81 mg Oral Daily  . cholecalciferol  1,000 Units Oral Daily  . citalopram  10 mg Oral Daily  . divalproex  250 mg Oral Daily  . divalproex  500 mg Oral QHS  . enoxaparin (LOVENOX) injection  40 mg Subcutaneous Q24H  . finasteride  5 mg Oral Daily  . haloperidol  2 mg Oral TID  . lisinopril  20 mg Oral Daily  . LORazepam  1 mg Intravenous Once  . tamsulosin  0.4 mg Oral QPC supper  . traZODone  50 mg Oral QHS   Continuous Infusions: . cefTRIAXone (ROCEPHIN)  IV 1 g (06/26/17 1013)     LOS: 1 day   Time spent: 25 minutes.  Vance Gather, MD [facility] Pager [phone]  If 7PM-7AM, please contact night-coverage [email or web address] Password [email or web address] 06/26/2017, 2:28 PM
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
## 2017-06-26 NOTE — Clinical Social Work Note (Signed)
Clinical Social Work Assessment  Patient Details  Name: Joe Dawson MRN: [id number] Date of Birth: 04-06-1951  Date of referral:  06/26/17               Reason for consult:  Facility Placement, Discharge Planning                Permission sought to share information with:  Psychiatrist, Family Supports, Chartered certified accountant granted to share information::     Name::        Agency::  SNF  Relationship::  Spouse   Contact Information:     Housing/Transportation Living arrangements for the past 2 months:  Single Family Garment/textile technologist ) Source of Information:  Patient Patient Interpreter Needed:  None Criminal Activity/Legal Involvement Pertinent to Current Situation/Hospitalization:  No - Comment as needed Significant Relationships:  None Lives with:  Self Do you feel safe going back to the place where you live?  No Need for family participation in patient care:  Yes (Patient Blind and demented )  Care giving concerns:   Patient w/ history of atrophy with blindness, dementia with behavorial disturbances, Hypertension, hearing loss, gait instability/falls and tobacco use.   Patient admitted for increased agitation and aggressive behavior. Psychiatry was consulted, recommending disposition to locked dementia unit.  Psychiatry recommends Dementia Unit.    Social Worker assessment / plan:  CSW met with patient spouse to discuss her concerns about patient advancing dementia and barriers to take the patient home. She reports the patient has been combative at home and falling more. The patient is blind and relies solely on her care for him. She reports when the patient is confused he can be combative.  She reports he was recently at [facility] for medication management and stabilization. She reports he was at home for 4 days.She was able to [facility] to care for him for a few days-assisting with feeding, bathing and dressing until patient became confused and  combative. She brought him  to the hospital.   She is agreeable for SNF placement/LTC because she is unable to care for patient.   CSW discussed SNF process, insurance (what qualifies rehab) vs. longterm Care. CSW explain custodial care vs. Rehab. Patient spouse reports she inquired about applying for medicaid and learned she will need to spend down for patient to qualify. She plans to follow up with DSS in the a.m.  CSW informed spouse patient information will be faxed to ALL faciilites in the area. The spouse understands the facility may not be local.   Plan: SNF.   Barriers to Discharge SNF bed unknown Short term vs. LTC payor source.  Level 2 state PASRR   W/o sitter for 24 hours.  Employment status:  Disabled  Forensic scientist:  Medicare PT Recommendations:  [place] / Referral to community resources:  [place]  Patient/Family's Response to care:  Patient spouse reports feeling "guilty" about placing the patient.   Patient/Family's Understanding of and Emotional Response to Diagnosis, Current Treatment, and Prognosis: Patient spouse very knowledgeable and involved in patient care. She has been proactive w/ following up with treatments [facility]) and adjusting his medication.   Emotional Assessment Appearance:  Appears stated age Attitude/Demeanor/Rapport:    Affect (typically observed):  Accepting, Pleasant Orientation:  Oriented to Self Alcohol / Substance use:  Not Applicable Psych involvement (Current and /or in the community):  Yes,does not recommend psychiatric facility. Recommends dementia unit.   Discharge Needs  Concerns to be  addressed:  Discharge Planning Concerns Readmission within the last 30 days:  No Current discharge risk:  None Barriers to Discharge:  No SNF bed, Continued Medical Work up, Requiring sitter/restraints   Lia Hopping, LCSW 06/26/2017, 3:51 PM

## 2017-06-26 NOTE — Progress Notes (Signed)
Pt screaming out and trying to get himself out of bed. Pt attempting to kick and Paramedic. K Schorr, floor coverage paged to request restraints and something IV to help calm pt down. Order received for non-violent restraints and IV Ativan.

## 2017-06-27 LAB — URINE CULTURE: Culture: >=100000 — AB

## 2017-06-27 MED ORDER — LORAZEPAM 0.5 MG PO TABS
0.5000 mg | ORAL_TABLET | Freq: Two times a day (BID) | ORAL | Status: DC | PRN
  Administered 2017-06-28 – 2017-07-23 (×8): 0.5 mg via ORAL
  Filled 2017-06-27 (×10): qty 1

## 2017-06-27 MED ORDER — HYDRALAZINE HCL 20 MG/ML IJ SOLN
5.0000 mg | Freq: Once | INTRAMUSCULAR | Status: DC
  Filled 2017-06-27 (×2): qty 1

## 2017-06-27 NOTE — Progress Notes (Signed)
PROGRESS NOTE  Joe Dawson  SAY:[id number] DOB: 1950-10-01 DOA: 06/25/2017 PCP: Gayland Curry, DO   Brief Narrative: Joe Dawson is a 67 y.o. male with a history of optic nerve atrophy with blindness, dementia with behavioral disturbances, HTN, BPH, DJD, gait instability/falls, and tobacco use who was brought to the ED by his wife for increased agitation and aggressive behavior, IVC'ed in th ED. He had nitrite-positive pyuria, started on ceftriaxone with urine culture pending. Psychiatry was consulted, recommending disposition to locked dementia unit. Social work consulted.   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 Had similar presentation, admitted to [facility] 1/9 - 1/30.  - Psychiatry consulted, recommend continuing home psychotropic medications.  - CSW consulted for placement in memory care unit. Once bed available, will need to be without sitter x24 hrs prior to discharge.   Klebsiella UTI: Possible exacerbating factor in mental status, though not likely the primary force as he has not significantly improved with treatment. - Continue ceftriaxone per susceptibility data.  BPH: Chronic, stable. Maintaining good UOP.  - Continue finasteride, flomax  HTN: Chronic, stable - Continue lisinopril  DVT prophylaxis: Lovenox Code Status: DNR Family Communication: None at bedside Disposition Plan: Memory care unit, medically stable for discharge.   Consultants:   Psychiatry, Dr. Mariea Clonts.   Procedures:   None  Antimicrobials:  Ceftriaxone   Subjective: Tired, but without other complaints. Had significant agitation last night. Wife at bedside, tearful over her inability to care for  him at home.   Objective: BP 122/69 (BP Location: Left Arm)   Pulse 60   Temp 97.7 F (36.5 C) (Axillary)   Resp 14   Ht 5\' 7"  (1.702 m)   Wt 65.8 kg (145 lb)   SpO2 99%   BMI 22.71 kg/m   Gen: 67 y.o. male in no distress  Pulm: Non-labored breathing. Clear to auscultation bilaterally.  CV: Regular rate and rhythm. No murmur, rub, or gallop. No JVD, no pedal edema. GI: Abdomen soft, non-tender, non-distended, with normoactive bowel sounds. No organomegaly or masses felt. Ext: Warm, no deformities Skin: No rashes, lesions no ulcers Neuro: Alert, oriented to name only, still does not know how old he is, where he is, when it is, or situation. Blind. Moving all extremities.  Psych: Judgement and insight appear impaired by cognitive impairment. Denies SI/HI.  CBC: Recent Labs  Lab 06/25/17 0852  WBC 9.7  NEUTROABS 7.0  HGB 13.2  HCT 37.0*  MCV 98.1  PLT 235   Basic Metabolic Panel: Recent Labs  Lab 06/25/17 0945  NA 138  K 4.2  CL 105  CO2 26  GLUCOSE 85  BUN 11  CREATININE 0.58*  CALCIUM 8.6*   Liver Function Tests: Recent Labs  Lab 06/25/17 0945  AST 27  ALT 20  ALKPHOS 53  BILITOT 0.6  PROT 6.4*  ALBUMIN 3.1*   Urine analysis:    Component Value Date/Time   COLORURINE YELLOW 06/25/2017 0910   APPEARANCEUR HAZY (A) 06/25/2017 0910   LABSPEC 1.012 06/25/2017 0910   PHURINE 7.0 06/25/2017 0910   GLUCOSEU NEGATIVE 06/25/2017 0910   HGBUR SMALL (A) 06/25/2017 0910   BILIRUBINUR NEGATIVE 06/25/2017 0910   KETONESUR NEGATIVE 06/25/2017 0910  PROTEINUR NEGATIVE 06/25/2017 0910   NITRITE POSITIVE (A) 06/25/2017 0910   LEUKOCYTESUR MODERATE (A) 06/25/2017 0910   Recent Results (from the past 240 hour(s))  Urine Culture     Status: Abnormal   Collection Time: 06/25/17  9:10 AM  Result Value Ref Range Status   Specimen Description   Final    URINE, CLEAN CATCH Performed at [facility], [place] [address] [place], [place] [address]     Special Requests   Final    NONE Performed at [facility], [place] [address] [place], [place] [address]    Culture >=100,000 COLONIES/mL KLEBSIELLA PNEUMONIAE (A)  Final   Report Status 06/27/2017 FINAL  Final   Organism ID, Bacteria KLEBSIELLA PNEUMONIAE (A)  Final      Susceptibility   Klebsiella pneumoniae - MIC*    AMPICILLIN >=32 RESISTANT Resistant     CEFAZOLIN <=4 SENSITIVE Sensitive     CEFTRIAXONE <=1 SENSITIVE Sensitive     CIPROFLOXACIN <=0.25 SENSITIVE Sensitive     GENTAMICIN <=1 SENSITIVE Sensitive     IMIPENEM <=0.25 SENSITIVE Sensitive     NITROFURANTOIN 128 RESISTANT Resistant     TRIMETH/SULFA <=20 SENSITIVE Sensitive     AMPICILLIN/SULBACTAM 8 SENSITIVE Sensitive     PIP/TAZO <=4 SENSITIVE Sensitive     Extended ESBL NEGATIVE Sensitive     * >=100,000 COLONIES/mL KLEBSIELLA PNEUMONIAE      Time spent: 25 minutes.  Vance Gather, MD [facility] [email or web address] Password [facility] 06/27/2017, 3:58 PM

## 2017-06-27 NOTE — Progress Notes (Addendum)
CSW spoke with patient daughter. She has completed medicaid application and provided pending number [id number] T. CSW to complete PASRR.  Patient still having behaviors, siiter at bedside.   Kathrin Greathouse, Latanya Presser, MSW Clinical Social Worker  [phone] 06/27/2017  4:47 PM

## 2017-06-27 NOTE — Consult Note (Signed)
   [facility] CM Inpatient Consult   06/27/2017  Horatio EUELL SCHIFF 10/09/1950 [id number]    Patient screened for [place] Management services due to being on HTA list per [place] Management office.   Went to bedside. Patient confused per nursing. Wife not present.   Will continue to follow along for potential [facility] Care Management engagement.  Will discuss above notes with inpatient RNCM.    Marthenia Rolling, MSN-Ed, RN,BSN [facility] Liaison [phone]

## 2017-06-28 NOTE — Progress Notes (Signed)
Patient sitting up in bed with belt restraint in place. Patient refusing am medications, will not give reason, will not answer orientation questions. Continues to remove ID bands. Attempted once to remove IV but was redirected. Nurse attempted to flush IV, unable, occluded. Patient allowed nurse to reinsert IV without complaint. During removal of occluded IV, patient shouted and pinched nurse firmly on the wrist and attempted to grab nurses scrubs at neckline, was stopped by sitter in room. Once IV was removed patient stopped yelling and attempting to reach for staff. IV antibiotics running. Will cont to monitor.

## 2017-06-28 NOTE — Progress Notes (Signed)
Consult: SNF CSW will attempt to complete state level II PASRR nursing facility screening with representative when the patient is more stable. CSW faxed patient clinical information to SNF in 25 mile radius and beyond. No bed offers at this time.  CSW plan to discuss with leadership to determine other potential SNF options.   CSW will continue to assist with discharge.    Kathrin Greathouse, Latanya Presser, MSW Clinical Social Worker  [phone] 06/28/2017  1:38 PM

## 2017-06-28 NOTE — NC FL2 (Signed)
[place] LEVEL OF CARE SCREENING TOOL     IDENTIFICATION  Patient Name: Joe Dawson Birthdate: [birth year] Sex: male Admission Date (Current Location): 06/25/2017  [facility] and [place] Number:  Herbalist and Address:  [facility],  [place] [address] [place]      Provider Number: [id number]  Attending Physician Name and Address:  Cristy Folks, MD  Relative Name and Phone Number:       Current Level of Care: Hospital Recommended Level of Care: [place] Prior Approval Number:    Date Approved/Denied:   PASRR Number: Pending  Discharge Plan: SNF    Current Diagnoses: Patient Active Problem List   Diagnosis Date Noted  . UTI (urinary tract infection) 06/25/2017  . Dementia with behavioral disturbance 05/28/2017  . 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 Memory loss 11/21/2016  . Essential hypertension, benign 11/21/2014  . 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 Hyperlipidemia LDL goal <100 08/09/2013  . Blind 08/09/2013  . Vitamin D insufficiency 08/09/2013  . Benign prostatic hypertrophy 08/09/2013  . Hyperglycemia 08/09/2013  . Tobacco abuse 08/09/2013    Orientation RESPIRATION BLADDER Height & Weight     Self  Normal Continent Weight: 145 lb (65.8 kg) Height:  5\' 7"  (170.2 cm)  BEHAVIORAL SYMPTOMS/MOOD NEUROLOGICAL BOWEL NUTRITION STATUS  Physically abusive   Continent Diet(See discharge Summary)  AMBULATORY STATUS COMMUNICATION OF NEEDS Skin   Extensive Assist Verbally Normal                       Personal Care Assistance Level of Assistance  Bathing, Feeding, Dressing Bathing Assistance: Maximum assistance Feeding assistance: Limited assistance Dressing Assistance: Maximum assistance     Functional Limitations Info  Sight, Hearing, Speech Sight Info: Impaired Hearing Info: Adequate Speech Info: Adequate    SPECIAL CARE FACTORS FREQUENCY  PT  (By licensed PT), OT (By licensed OT)     PT Frequency: 5x/week  OT Frequency: 5x/week             Contractures Contractures Info: Not present    Additional Factors Info  Code Status, Allergies, Psychotropic Code Status Info: DNR Allergies Info: Allergies: Epinephrine Psychotropic Info: Celexa 10mg , Depakote 250mg -500mg ,Haldol 2mg , Trazadone 50mg ,          Current Medications (06/28/2017):  This is the current hospital active medication list Current Facility-Administered Medications  Medication Dose Route Frequency Provider Last Rate Last Dose  . acetaminophen (TYLENOL) tablet 650 mg  650 mg Oral Q6H PRN [name], MD      . aspirin EC tablet 81 mg  81 mg Oral Daily [name], MD   81 mg at 06/27/17 1025  . cefTRIAXone (ROCEPHIN) 1 g in dextrose 5 % 50 mL IVPB  1 g Intravenous Q24H Lacretia Leigh, MD   Stopped at 06/27/17 1112  . cholecalciferol (VITAMIN D) tablet 1,000 Units  1,000 Units Oral Daily [name], MD   1,000 Units at 06/27/17 1025  . citalopram (CELEXA) tablet 10 mg  10 mg Oral Daily [name], MD   10 mg at 06/27/17 1025  . divalproex (DEPAKOTE SPRINKLE) capsule 250 mg  250 mg Oral Daily Minda Ditto, RPH   250 mg at 06/27/17 1024  . divalproex (DEPAKOTE SPRINKLE) capsule 500 mg  500 mg Oral QHS Minda Ditto, RPH   500 mg at 06/27/17 2224  . enoxaparin (LOVENOX) injection 40  mg  40 mg Subcutaneous Q24H [name], MD   40 mg at 06/27/17 1623  . finasteride (PROSCAR) tablet 5 mg  5 mg Oral Daily [name], MD   5 mg at 06/27/17 1025  . haloperidol (HALDOL) tablet 2 mg  2 mg Oral TID Minda Ditto, RPH   2 mg at 06/27/17 2223  . haloperidol lactate (HALDOL) injection 5 mg  5 mg Intramuscular Q6H PRN [name], MD   5 mg at 06/25/17 1228  . hydrALAZINE (APRESOLINE) injection 5 mg  5 mg Intravenous Once Schorr, Rhetta Mura, NP      . lisinopril (PRINIVIL,ZESTRIL) tablet 20 mg  20 mg Oral Daily [name],  MD   20 mg at 06/27/17 1025  . LORazepam (ATIVAN) injection 1 mg  1 mg Intravenous Once Bodenheimer, Charles A, NP      . LORazepam (ATIVAN) tablet 0.5 mg  0.5 mg Oral BID PRN Patrecia Pour, MD      . tamsulosin (FLOMAX) capsule 0.4 mg  0.4 mg Oral QPC supper [name], MD   0.4 mg at 06/27/17 1623  . traZODone (DESYREL) tablet 50 mg  50 mg Oral QHS [name], MD   50 mg at 06/27/17 2223     Discharge Medications: Please see discharge summary for a list of discharge medications.  Relevant Imaging Results:  Relevant Lab Results:   Additional Information [place], LCSW

## 2017-06-28 NOTE — Progress Notes (Signed)
PROGRESS NOTE    Joe Dawson  EPP:[id number] DOB: 16-Aug-1950 DOA: 06/25/2017 PCP: Gayland Curry, DO    Brief Narrative:  Joe Dawson is a 67 year old gentleman with a past medical history relevant for hypertension, BPH, degenerative joint disease, blindness, dementia with behavioral changes who was brought in by his wife for increased agitation and aggressive behavior.  Patient is pending placement.   Assessment & Plan:   Principal Problem:   Dementia with behavioral disturbance Active Problems:   Blind   Optic nerve atrophy, bilateral   Essential hypertension, benign   Hearing loss secondary to cerumen impaction, bilateral   Memory loss   UTI (urinary tract infection)   ##) Behavioral disturbance secondary to dementia: -Pending placement - PRN Ativan for anxiety and as needed haloperidol for agitation  ##) UTI: Noted on admission culture of urine on 06/25/2017 to have Klebsiella pneumoniae that was susceptible to ceftriaxone -Continue ceftriaxone daily  ##) Psych: -Continue citalopram 10 mg daily -Continue divalproex capsule 250 mg daily and 500 mg at bedtime -Continue haloperidol 2 mg 3 times a day  ##) Benign prostatic hypertrophy: -Continue tamsulosin 0.4 mg daily -Continue finasteride 5 mg daily  ##) Hypertension: -Continue lisinopril 20 mg daily -Continue aspirin 81 mg daily  Fluids: Tolerating p.o. Electrolytes: monitor and supp Nutrition: cardiac diet  Consultants:   Case management  Procedures: (Don't include imaging studies which can be auto populated. Include things that cannot be auto populated i.e. Echo, Carotid and venous dopplers, Foley, Bipap, HD, tubes/drains, wound vac, central lines etc)  None  Antimicrobials: (specify start and planned stop date. Auto populated tables are space occupying and do not give end dates)  Ceftriaxone started 06/25/2014   Subjective: The patient cannot give me any history.  He does attempt to hit me when I try to  talk to him.  He becomes agitated when questioned significantly.  Objective: Vitals:   06/27/17 1444 06/27/17 2124 06/28/17 0622 06/28/17 1331  BP: 122/69 (!) 156/85 137/82 135/73  Pulse: 60 65 62 61  Resp: 14 14 14 16   Temp: 97.7 F (36.5 C) 98.5 F (36.9 C) 97.9 F (36.6 C) 98.3 F (36.8 C)  TempSrc: Axillary Oral Axillary Oral  SpO2: 99% 97% 98% 97%  Weight:      Height:        Intake/Output Summary (Last 24 hours) at 06/28/2017 1534 Last data filed at 06/28/2017 1457 Gross per 24 hour  Intake 1530 ml  Output 1000 ml  Net 530 ml   Filed Weights   06/25/17 1321  Weight: 65.8 kg (145 lb)    Examination:  General exam: No acute distress, can become quite agitated if questioned Respiratory system: Clear to auscultation. Respiratory effort normal. Cardiovascular system: S1 & S2 heard, RRR.  Gastrointestinal system: Abdomen is nondistended, soft and nontender. No organomegaly or masses felt. Normal bowel sounds heard. Central nervous system: No apparent focal neurological deficits Extremities: No lower extremity edema Skin: No rashes on visible skin Psychiatry: Disorganized, agitated and angry    Data Reviewed: I have personally reviewed following labs and imaging studies  CBC: Recent Labs  Lab 06/25/17 0852  WBC 9.7  NEUTROABS 7.0  HGB 13.2  HCT 37.0*  MCV 98.1  PLT 606   Basic Metabolic Panel: Recent Labs  Lab 06/25/17 0945  NA 138  K 4.2  CL 105  CO2 26  GLUCOSE 85  BUN 11  CREATININE 0.58*  CALCIUM 8.6*   GFR: Estimated Creatinine Clearance: 83.4 mL/min (  A) (by C-G formula based on SCr of 0.58 mg/dL (L)). Liver Function Tests: Recent Labs  Lab 06/25/17 0945  AST 27  ALT 20  ALKPHOS 53  BILITOT 0.6  PROT 6.4*  ALBUMIN 3.1*   No results for input(s): LIPASE, AMYLASE in the last 168 hours. No results for input(s): AMMONIA in the last 168 hours. Coagulation Profile: No results for input(s): INR, PROTIME in the last 168 hours. Cardiac  Enzymes: No results for input(s): CKTOTAL, CKMB, CKMBINDEX, TROPONINI in the last 168 hours. BNP (last 3 results) No results for input(s): PROBNP in the last 8760 hours. HbA1C: No results for input(s): HGBA1C in the last 72 hours. CBG: Recent Labs  Lab 06/25/17 0829  GLUCAP 91   Lipid Profile: No results for input(s): CHOL, HDL, LDLCALC, TRIG, CHOLHDL, LDLDIRECT in the last 72 hours. Thyroid Function Tests: No results for input(s): TSH, T4TOTAL, FREET4, T3FREE, THYROIDAB in the last 72 hours. Anemia Panel: No results for input(s): VITAMINB12, FOLATE, FERRITIN, TIBC, IRON, RETICCTPCT in the last 72 hours. Sepsis Labs: No results for input(s): PROCALCITON, LATICACIDVEN in the last 168 hours.  Recent Results (from the past 240 hour(s))  Urine Culture     Status: Abnormal   Collection Time: 06/25/17  9:10 AM  Result Value Ref Range Status   Specimen Description   Final    URINE, CLEAN CATCH Performed at [facility], [place] [address], [place], [place] [address]    Special Requests   Final    NONE Performed at [facility], [place] [address] [place], [place] [address]    Culture >=100,000 COLONIES/mL KLEBSIELLA PNEUMONIAE (A)  Final   Report Status 06/27/2017 FINAL  Final   Organism ID, Bacteria KLEBSIELLA PNEUMONIAE (A)  Final      Susceptibility   Klebsiella pneumoniae - MIC*    AMPICILLIN >=32 RESISTANT Resistant     CEFAZOLIN <=4 SENSITIVE Sensitive     CEFTRIAXONE <=1 SENSITIVE Sensitive     CIPROFLOXACIN <=0.25 SENSITIVE Sensitive     GENTAMICIN <=1 SENSITIVE Sensitive     IMIPENEM <=0.25 SENSITIVE Sensitive     NITROFURANTOIN 128 RESISTANT Resistant     TRIMETH/SULFA <=20 SENSITIVE Sensitive     AMPICILLIN/SULBACTAM 8 SENSITIVE Sensitive     PIP/TAZO <=4 SENSITIVE Sensitive     Extended ESBL NEGATIVE Sensitive     * >=100,000 COLONIES/mL KLEBSIELLA PNEUMONIAE         Radiology Studies: No results found.      Scheduled  Meds: . aspirin EC  81 mg Oral Daily  . cholecalciferol  1,000 Units Oral Daily  . citalopram  10 mg Oral Daily  . divalproex  250 mg Oral Daily  . divalproex  500 mg Oral QHS  . enoxaparin (LOVENOX) injection  40 mg Subcutaneous Q24H  . finasteride  5 mg Oral Daily  . haloperidol  2 mg Oral TID  . hydrALAZINE  5 mg Intravenous Once  . lisinopril  20 mg Oral Daily  . LORazepam  1 mg Intravenous Once  . tamsulosin  0.4 mg Oral QPC supper  . traZODone  50 mg Oral QHS   Continuous Infusions: . cefTRIAXone (ROCEPHIN)  IV Stopped (06/28/17 1042)     LOS: 3 days    Time spent: [place], MD [facility]  If 7PM-7AM, please contact night-coverage [email or web address] Password [facility] 06/28/2017, 3:34 PM

## 2017-06-29 DIAGNOSIS — R413 Other amnesia: Secondary | ICD-10-CM

## 2017-06-29 NOTE — Progress Notes (Signed)
PROGRESS NOTE    Joe Dawson  MPN:[id number] DOB: 1950-08-06 DOA: 06/25/2017 PCP: Gayland Curry, DO   Brief Narrative:  67 year old with a history of essential hypertension, BPH, degenerative disc disease, dementia with behavioral changes, blindness came to the hospital for evaluation of increasing agitation and aggressive behavior.  Upon admission he was found to urinary tract infection which was susceptible to Rocephin therefore patient was treated with 5 days of antibiotics.  For several days now patient is pending placement   Assessment & Plan:   Principal Problem:   Dementia with behavioral disturbance Active Problems:   Blind   Optic nerve atrophy, bilateral   Essential hypertension, benign   Hearing loss secondary to cerumen impaction, bilateral   Memory loss   UTI (urinary tract infection)   Urinary tract infection -Urine cultures has grown Klebsiella pneumonia.  This is susceptible to Rocephin -Patient has been treated with 5 days of Rocephin, will discontinue this today  Behavioral disturbances due to dementia - Treating his anxiety and agitation with as needed Ativan and Haldol -Currently pending placement.  Apparently he is going to need geriatric psychiatry inpatient hospitalization, social worker where he was working on this. Continue his other home medications -Maintain one-to-one sitter in place  BPH -Continue Flomax and finasteride  Essential hypertension Continue lisinopril and aspirin   DVT prophylaxis: Lovenox Code Status: DO NOT RESUSCITATE Family Communication: Wife at bedside Disposition Plan: Pending placement   Antimicrobials:   Status post 5 days Rocephin, last dose June 24, 2017   Subjective: No complaints this morning  Objective: Vitals:   06/28/17 0622 06/28/17 1331 06/28/17 2129 06/29/17 0540  BP: 137/82 135/73 124/67 135/75  Pulse: 62 61 73 65  Resp: 14 16 15 16   Temp: 97.9 F (36.6 C) 98.3 F (36.8 C) 98.7 F (37.1 C)  98.7 F (37.1 C)  TempSrc: Axillary Oral Oral Oral  SpO2: 98% 97% 97% 96%  Weight:      Height:        Intake/Output Summary (Last 24 hours) at 06/29/2017 1404 Last data filed at 06/29/2017 1354 Gross per 24 hour  Intake 1445 ml  Output 1375 ml  Net 70 ml   Filed Weights   06/25/17 1321  Weight: 65.8 kg (145 lb)    Examination:  General exam: Appears calm and comfortable  Respiratory system: Clear to auscultation. Respiratory effort normal. Cardiovascular system: S1 & S2 heard, RRR. No JVD, murmurs, rubs, gallops or clicks. No pedal edema. Gastrointestinal system: Abdomen is nondistended, soft and nontender. No organomegaly or masses felt. Normal bowel sounds heard. Central nervous system: Alert and oriented. No focal neurological deficits. Extremities: Symmetric 5 x 5 power. Skin: No rashes, lesions or ulcers Psychiatry: Disorganized thoughts    Data Reviewed:   CBC: Recent Labs  Lab 06/25/17 0852  WBC 9.7  NEUTROABS 7.0  HGB 13.2  HCT 37.0*  MCV 98.1  PLT 008   Basic Metabolic Panel: Recent Labs  Lab 06/25/17 0945  NA 138  K 4.2  CL 105  CO2 26  GLUCOSE 85  BUN 11  CREATININE 0.58*  CALCIUM 8.6*   GFR: Estimated Creatinine Clearance: 83.4 mL/min (A) (by C-G formula based on SCr of 0.58 mg/dL (L)). Liver Function Tests: Recent Labs  Lab 06/25/17 0945  AST 27  ALT 20  ALKPHOS 53  BILITOT 0.6  PROT 6.4*  ALBUMIN 3.1*   No results for input(s): LIPASE, AMYLASE in the last 168 hours. No results for input(s): AMMONIA  in the last 168 hours. Coagulation Profile: No results for input(s): INR, PROTIME in the last 168 hours. Cardiac Enzymes: No results for input(s): CKTOTAL, CKMB, CKMBINDEX, TROPONINI in the last 168 hours. BNP (last 3 results) No results for input(s): PROBNP in the last 8760 hours. HbA1C: No results for input(s): HGBA1C in the last 72 hours. CBG: Recent Labs  Lab 06/25/17 0829  GLUCAP 91   Lipid Profile: No results for  input(s): CHOL, HDL, LDLCALC, TRIG, CHOLHDL, LDLDIRECT in the last 72 hours. Thyroid Function Tests: No results for input(s): TSH, T4TOTAL, FREET4, T3FREE, THYROIDAB in the last 72 hours. Anemia Panel: No results for input(s): VITAMINB12, FOLATE, FERRITIN, TIBC, IRON, RETICCTPCT in the last 72 hours. Sepsis Labs: No results for input(s): PROCALCITON, LATICACIDVEN in the last 168 hours.  Recent Results (from the past 240 hour(s))  Urine Culture     Status: Abnormal   Collection Time: 06/25/17  9:10 AM  Result Value Ref Range Status   Specimen Description   Final    URINE, CLEAN CATCH Performed at [facility], [place] [address], [place], [place] [address]    Special Requests   Final    NONE Performed at [facility], [place] [address], [place], [place] [address]    Culture >=100,000 COLONIES/mL KLEBSIELLA PNEUMONIAE (A)  Final   Report Status 06/27/2017 FINAL  Final   Organism ID, Bacteria KLEBSIELLA PNEUMONIAE (A)  Final      Susceptibility   Klebsiella pneumoniae - MIC*    AMPICILLIN >=32 RESISTANT Resistant     CEFAZOLIN <=4 SENSITIVE Sensitive     CEFTRIAXONE <=1 SENSITIVE Sensitive     CIPROFLOXACIN <=0.25 SENSITIVE Sensitive     GENTAMICIN <=1 SENSITIVE Sensitive     IMIPENEM <=0.25 SENSITIVE Sensitive     NITROFURANTOIN 128 RESISTANT Resistant     TRIMETH/SULFA <=20 SENSITIVE Sensitive     AMPICILLIN/SULBACTAM 8 SENSITIVE Sensitive     PIP/TAZO <=4 SENSITIVE Sensitive     Extended ESBL NEGATIVE Sensitive     * >=100,000 COLONIES/mL KLEBSIELLA PNEUMONIAE         Radiology Studies: No results found.      Scheduled Meds: . aspirin EC  81 mg Oral Daily  . cholecalciferol  1,000 Units Oral Daily  . citalopram  10 mg Oral Daily  . divalproex  250 mg Oral Daily  . divalproex  500 mg Oral QHS  . enoxaparin (LOVENOX) injection  40 mg Subcutaneous Q24H  . finasteride  5 mg Oral Daily  . haloperidol  2 mg Oral TID  . hydrALAZINE  5  mg Intravenous Once  . lisinopril  20 mg Oral Daily  . LORazepam  1 mg Intravenous Once  . tamsulosin  0.4 mg Oral QPC supper  . traZODone  50 mg Oral QHS   Continuous Infusions:   LOS: 4 days    Time spent:  30 mins     Jordi Kamm Arsenio Loader, MD [facility] Pager [phone]   If 7PM-7AM, please contact night-coverage [email or web address] Password [email or web address] 06/29/2017, 2:04 PM

## 2017-06-29 NOTE — Progress Notes (Signed)
Patient is more appropriate for [facility] placement for mood stabilization. Psychiatrist addendum 2/4 note. Psychiatric social worker is following to assist with this plan. Patient clinical information has been sent to geriatric facilities.  CSW left voicemail for spouse to provide update.   Kathrin Greathouse, Latanya Presser, MSW Clinical Social Worker  [phone] 06/29/2017  1:51 PM

## 2017-06-29 NOTE — Care Management Important Message (Signed)
Important Message  Patient Details  Name: DEVLIN BRINK MRN: [id number] Date of Birth: 08/31/1950   Medicare Important Message Given:  Yes    Kerin Salen 06/29/2017, 10:48 AMImportant Message  Patient Details  Name: KADEEN SROKA MRN: [id number] Date of Birth: [birth year]   Medicare Important Message Given:  Yes    Kerin Salen 06/29/2017, 10:48 AM

## 2017-06-30 DIAGNOSIS — H6123 Impacted cerumen, bilateral: Secondary | ICD-10-CM

## 2017-06-30 DIAGNOSIS — H547 Unspecified visual loss: Secondary | ICD-10-CM

## 2017-06-30 MED ORDER — NICOTINE 14 MG/24HR TD PT24
14.0000 mg | MEDICATED_PATCH | Freq: Every day | TRANSDERMAL | Status: DC
Start: 2017-06-30 — End: 2017-07-24
  Administered 2017-06-30 – 2017-07-24 (×23): 14 mg via TRANSDERMAL
  Filled 2017-06-30 (×24): qty 1

## 2017-06-30 NOTE — Progress Notes (Signed)
LCSW following for gero psych placement.   Patient has been faxed out to the following facilities:   [facility]- declined due to medical acuity  [facility]- No gero bed [place] due to aggression [place]-   LCSW will continue to follow for placement.   Carolin Coy [facility] [facility]

## 2017-06-30 NOTE — Progress Notes (Signed)
PROGRESS NOTE    Joe Dawson  EZM:[id number] DOB: 1950-06-05 DOA: 06/25/2017 PCP: Gayland Curry, DO   Brief Narrative:  67 year old with a history of essential hypertension, BPH, degenerative disc disease, dementia with behavioral changes, blindness came to the hospital for evaluation of increasing agitation and aggressive behavior.  Upon admission he was found to urinary tract infection which was susceptible to Rocephin therefore patient was treated with 5 days of antibiotics.  For several days now patient is pending placement   Assessment & Plan:   Principal Problem:   Dementia with behavioral disturbance Active Problems:   Blind   Optic nerve atrophy, bilateral   Essential hypertension, benign   Hearing loss secondary to cerumen impaction, bilateral   Memory loss   UTI (urinary tract infection)   Urinary tract infection -Urine cultures has grown Klebsiella pneumonia.  This is susceptible to Rocephin -Status post 5 days of Rocephin, discontinued 2/7  Behavioral disturbances due to dementia, stable - Treating his anxiety and agitation with as needed Ativan and Haldol -Currently pending placement.  Apparently he is going to need geriatric psychiatry inpatient hospitalization, social worker where he was working on this. Continue his other home medications -Maintain one-to-one sitter in place  BPH stable -Continue Flomax and finasteride  Essential hypertension Continue lisinopril and aspirin   DVT prophylaxis: Lovenox Code Status: DO NOT RESUSCITATE Family Communication: None at bedside Disposition Plan: Pending placement   Antimicrobials:   Status post 5 days Rocephin, last dose June 24, 2017   Subjective: No acute events overnight.  Off and on episodes of agitation.  Objective: Vitals:   06/29/17 1413 06/29/17 2109 06/30/17 0622 06/30/17 1319  BP: 123/72 134/74 (!) 104/56 134/71  Pulse: (!) 59 69 (!) 53 (!) 58  Resp: 16 19 12 16   Temp: 98.5 F (36.9  C) 97.7 F (36.5 C) 98.8 F (37.1 C) 98.6 F (37 C)  TempSrc: Oral Axillary Oral Oral  SpO2: 97% 96% 98% 96%  Weight:      Height:        Intake/Output Summary (Last 24 hours) at 06/30/2017 1333 Last data filed at 06/30/2017 1319 Gross per 24 hour  Intake 1080 ml  Output 1700 ml  Net -620 ml   Filed Weights   06/25/17 1321  Weight: 65.8 kg (145 lb)    Examination:  General exam: Appears calm and comfortable  Respiratory system: Clear to auscultation. Respiratory effort normal. Cardiovascular system: S1 & S2 heard, RRR. No JVD, murmurs, rubs, gallops or clicks. No pedal edema. Gastrointestinal system: Abdomen is nondistended, soft and nontender. No organomegaly or masses felt. Normal bowel sounds heard. Central nervous system: Alert and oriented. No focal neurological deficits. Extremities: Symmetric 5 x 5 power. Skin: No rashes, lesions or ulcers Psychiatry: Disorganized thoughts    Data Reviewed:   CBC: Recent Labs  Lab 06/25/17 0852  WBC 9.7  NEUTROABS 7.0  HGB 13.2  HCT 37.0*  MCV 98.1  PLT 503   Basic Metabolic Panel: Recent Labs  Lab 06/25/17 0945  NA 138  K 4.2  CL 105  CO2 26  GLUCOSE 85  BUN 11  CREATININE 0.58*  CALCIUM 8.6*   GFR: Estimated Creatinine Clearance: 82.3 mL/min (A) (by C-G formula based on SCr of 0.58 mg/dL (L)). Liver Function Tests: Recent Labs  Lab 06/25/17 0945  AST 27  ALT 20  ALKPHOS 53  BILITOT 0.6  PROT 6.4*  ALBUMIN 3.1*   No results for input(s): LIPASE, AMYLASE in the  last 168 hours. No results for input(s): AMMONIA in the last 168 hours. Coagulation Profile: No results for input(s): INR, PROTIME in the last 168 hours. Cardiac Enzymes: No results for input(s): CKTOTAL, CKMB, CKMBINDEX, TROPONINI in the last 168 hours. BNP (last 3 results) No results for input(s): PROBNP in the last 8760 hours. HbA1C: No results for input(s): HGBA1C in the last 72 hours. CBG: Recent Labs  Lab 06/25/17 0829  GLUCAP 91    Lipid Profile: No results for input(s): CHOL, HDL, LDLCALC, TRIG, CHOLHDL, LDLDIRECT in the last 72 hours. Thyroid Function Tests: No results for input(s): TSH, T4TOTAL, FREET4, T3FREE, THYROIDAB in the last 72 hours. Anemia Panel: No results for input(s): VITAMINB12, FOLATE, FERRITIN, TIBC, IRON, RETICCTPCT in the last 72 hours. Sepsis Labs: No results for input(s): PROCALCITON, LATICACIDVEN in the last 168 hours.  Recent Results (from the past 240 hour(s))  Urine Culture     Status: Abnormal   Collection Time: 06/25/17  9:10 AM  Result Value Ref Range Status   Specimen Description   Final    URINE, CLEAN CATCH Performed at [facility], [place] [address] [place], [place] [address]    Special Requests   Final    NONE Performed at [facility], [place] [address], [place], [place] [address]    Culture >=100,000 COLONIES/mL KLEBSIELLA PNEUMONIAE (A)  Final   Report Status 06/27/2017 FINAL  Final   Organism ID, Bacteria KLEBSIELLA PNEUMONIAE (A)  Final      Susceptibility   Klebsiella pneumoniae - MIC*    AMPICILLIN >=32 RESISTANT Resistant     CEFAZOLIN <=4 SENSITIVE Sensitive     CEFTRIAXONE <=1 SENSITIVE Sensitive     CIPROFLOXACIN <=0.25 SENSITIVE Sensitive     GENTAMICIN <=1 SENSITIVE Sensitive     IMIPENEM <=0.25 SENSITIVE Sensitive     NITROFURANTOIN 128 RESISTANT Resistant     TRIMETH/SULFA <=20 SENSITIVE Sensitive     AMPICILLIN/SULBACTAM 8 SENSITIVE Sensitive     PIP/TAZO <=4 SENSITIVE Sensitive     Extended ESBL NEGATIVE Sensitive     * >=100,000 COLONIES/mL KLEBSIELLA PNEUMONIAE         Radiology Studies: No results found.      Scheduled Meds: . aspirin EC  81 mg Oral Daily  . cholecalciferol  1,000 Units Oral Daily  . citalopram  10 mg Oral Daily  . divalproex  250 mg Oral Daily  . divalproex  500 mg Oral QHS  . enoxaparin (LOVENOX) injection  40 mg Subcutaneous Q24H  . finasteride  5 mg Oral Daily  .  haloperidol  2 mg Oral TID  . hydrALAZINE  5 mg Intravenous Once  . lisinopril  20 mg Oral Daily  . LORazepam  1 mg Intravenous Once  . nicotine  14 mg Transdermal Daily  . tamsulosin  0.4 mg Oral QPC supper  . traZODone  50 mg Oral QHS   Continuous Infusions:   LOS: 5 days    Time spent:  30 mins     Hines Kloss Arsenio Loader, MD [facility] Pager [phone]   If 7PM-7AM, please contact night-coverage [email or web address] Password [email or web address] 06/30/2017, 1:33 PM

## 2017-07-01 NOTE — Progress Notes (Signed)
Patient became increasingly agitated, trying to remove waist restraint and attempting to get out of bed.  Restraints were removed and patient was allowed to stand, but was too weak to walk with assist of 3.  After getting back into bed, he again became very combative and agitated.  Kicking and hitting at staff as well as attempting to bite staff on multiple occasions.  Waist restraint reapplied as well as bilateral mittens.  Haldol IM administered to prevent harm to the patient or to staff.  Currently resting.

## 2017-07-01 NOTE — Progress Notes (Signed)
PROGRESS NOTE    Joe Dawson  KGY:[id number] DOB: 10-30-1950 DOA: 06/25/2017 PCP: Gayland Curry, DO   Brief Narrative:  67 year old with a history of essential hypertension, BPH, degenerative disc disease, dementia with behavioral changes, blindness came to the hospital for evaluation of increasing agitation and aggressive behavior.  Upon admission he was found to urinary tract infection which was susceptible to Rocephin therefore patient was treated with 5 days of antibiotics.  For several days now patient is pending placement   Assessment & Plan:   Principal Problem:   Dementia with behavioral disturbance Active Problems:   Blind   Optic nerve atrophy, bilateral   Essential hypertension, benign   Hearing loss secondary to cerumen impaction, bilateral   Memory loss   UTI (urinary tract infection)   Urinary tract infection, resolved -Urine cultures has grown Klebsiella pneumonia.  This is susceptible to Rocephin -Status post 5 days of Rocephin, discontinued 2/7  Behavioral disturbances due to dementia, stable -This is very off and on this morning he is calm - Treating his anxiety and agitation with as needed Ativan and Haldol -Currently pending placement.  Apparently he is going to need geriatric psychiatry inpatient hospitalization, social worker where he was working on this. Continue his other home medications -Maintain one-to-one sitter in place  BPH stable -Continue Flomax and finasteride  Essential hypertension Continue lisinopril and aspirin   DVT prophylaxis: Lovenox Code Status: DO NOT RESUSCITATE Family Communication: None at bedside Disposition Plan: Pending placement   Antimicrobials:   Status post 5 days Rocephin, last dose June 24, 2017   Subjective: No acute events overnight.  Remains calm this morning  Objective: Vitals:   06/30/17 0622 06/30/17 1319 06/30/17 2100 07/01/17 0932  BP: (!) 104/56 134/71 118/66 (!) 135/97  Pulse: (!) 53 (!)  58 65 65  Resp: 12 16 16    Temp: 98.8 F (37.1 C) 98.6 F (37 C) 98.2 F (36.8 C) 98.6 F (37 C)  TempSrc: Oral Oral Oral Oral  SpO2: 98% 96% 96%   Weight:      Height:        Intake/Output Summary (Last 24 hours) at 07/01/2017 1130 Last data filed at 06/30/2017 2200 Gross per 24 hour  Intake 240 ml  Output 700 ml  Net -460 ml   Filed Weights   06/25/17 1321  Weight: 65.8 kg (145 lb)    Examination:  General exam: Appears calm and comfortable  Respiratory system: Clear to auscultation. Respiratory effort normal. Cardiovascular system: S1 & S2 heard, RRR. No JVD, murmurs, rubs, gallops or clicks. No pedal edema. Gastrointestinal system: Abdomen is nondistended, soft and nontender. No organomegaly or masses felt. Normal bowel sounds heard. Central nervous system: Alert and oriented. No focal neurological deficits. Extremities: Symmetric 5 x 5 power. Skin: No rashes, lesions or ulcers Psychiatry: Disorganized thoughts    Data Reviewed:   CBC: Recent Labs  Lab 06/25/17 0852  WBC 9.7  NEUTROABS 7.0  HGB 13.2  HCT 37.0*  MCV 98.1  PLT 026   Basic Metabolic Panel: Recent Labs  Lab 06/25/17 0945  NA 138  K 4.2  CL 105  CO2 26  GLUCOSE 85  BUN 11  CREATININE 0.58*  CALCIUM 8.6*   GFR: Estimated Creatinine Clearance: 82.3 mL/min (A) (by C-G formula based on SCr of 0.58 mg/dL (L)). Liver Function Tests: Recent Labs  Lab 06/25/17 0945  AST 27  ALT 20  ALKPHOS 53  BILITOT 0.6  PROT 6.4*  ALBUMIN 3.1*  No results for input(s): LIPASE, AMYLASE in the last 168 hours. No results for input(s): AMMONIA in the last 168 hours. Coagulation Profile: No results for input(s): INR, PROTIME in the last 168 hours. Cardiac Enzymes: No results for input(s): CKTOTAL, CKMB, CKMBINDEX, TROPONINI in the last 168 hours. BNP (last 3 results) No results for input(s): PROBNP in the last 8760 hours. HbA1C: No results for input(s): HGBA1C in the last 72 hours. CBG: Recent  Labs  Lab 06/25/17 0829  GLUCAP 91   Lipid Profile: No results for input(s): CHOL, HDL, LDLCALC, TRIG, CHOLHDL, LDLDIRECT in the last 72 hours. Thyroid Function Tests: No results for input(s): TSH, T4TOTAL, FREET4, T3FREE, THYROIDAB in the last 72 hours. Anemia Panel: No results for input(s): VITAMINB12, FOLATE, FERRITIN, TIBC, IRON, RETICCTPCT in the last 72 hours. Sepsis Labs: No results for input(s): PROCALCITON, LATICACIDVEN in the last 168 hours.  Recent Results (from the past 240 hour(s))  Urine Culture     Status: Abnormal   Collection Time: 06/25/17  9:10 AM  Result Value Ref Range Status   Specimen Description   Final    URINE, CLEAN CATCH Performed at [facility], [place] [address], [place], [place] [address]    Special Requests   Final    NONE Performed at [facility], [place] [address], [place], [place] [address]    Culture >=100,000 COLONIES/mL KLEBSIELLA PNEUMONIAE (A)  Final   Report Status 06/27/2017 FINAL  Final   Organism ID, Bacteria KLEBSIELLA PNEUMONIAE (A)  Final      Susceptibility   Klebsiella pneumoniae - MIC*    AMPICILLIN >=32 RESISTANT Resistant     CEFAZOLIN <=4 SENSITIVE Sensitive     CEFTRIAXONE <=1 SENSITIVE Sensitive     CIPROFLOXACIN <=0.25 SENSITIVE Sensitive     GENTAMICIN <=1 SENSITIVE Sensitive     IMIPENEM <=0.25 SENSITIVE Sensitive     NITROFURANTOIN 128 RESISTANT Resistant     TRIMETH/SULFA <=20 SENSITIVE Sensitive     AMPICILLIN/SULBACTAM 8 SENSITIVE Sensitive     PIP/TAZO <=4 SENSITIVE Sensitive     Extended ESBL NEGATIVE Sensitive     * >=100,000 COLONIES/mL KLEBSIELLA PNEUMONIAE         Radiology Studies: No results found.      Scheduled Meds: . aspirin EC  81 mg Oral Daily  . cholecalciferol  1,000 Units Oral Daily  . citalopram  10 mg Oral Daily  . divalproex  250 mg Oral Daily  . divalproex  500 mg Oral QHS  . enoxaparin (LOVENOX) injection  40 mg Subcutaneous Q24H  .  finasteride  5 mg Oral Daily  . haloperidol  2 mg Oral TID  . hydrALAZINE  5 mg Intravenous Once  . lisinopril  20 mg Oral Daily  . LORazepam  1 mg Intravenous Once  . nicotine  14 mg Transdermal Daily  . tamsulosin  0.4 mg Oral QPC supper  . traZODone  50 mg Oral QHS   Continuous Infusions:   LOS: 6 days    Time spent:  30 mins     Chamar Broughton Arsenio Loader, MD [facility] Pager [phone]   If 7PM-7AM, please contact night-coverage [email or web address] Password [email or web address] 07/01/2017, 11:30 AM

## 2017-07-02 MED ORDER — HALOPERIDOL 1 MG PO TABS
2.0000 mg | ORAL_TABLET | Freq: Three times a day (TID) | ORAL | Status: DC
  Administered 2017-07-02 – 2017-07-12 (×30): 2 mg via ORAL
  Filled 2017-07-02 (×4): qty 4
  Filled 2017-07-02: qty 2
  Filled 2017-07-02 (×13): qty 4
  Filled 2017-07-02: qty 2
  Filled 2017-07-02 (×2): qty 4
  Filled 2017-07-02: qty 2
  Filled 2017-07-02 (×8): qty 4
  Filled 2017-07-02: qty 2
  Filled 2017-07-02 (×3): qty 4
  Filled 2017-07-02: qty 2

## 2017-07-02 NOTE — Progress Notes (Signed)
PROGRESS NOTE    Joe Dawson  HGD:[id number] DOB: 1951-04-24 DOA: 06/25/2017 PCP: Gayland Curry, DO   Brief Narrative:  67 year old with a history of essential hypertension, BPH, degenerative disc disease, dementia with behavioral changes, blindness came to the hospital for evaluation of increasing agitation and aggressive behavior.  Upon admission he was found to urinary tract infection which was susceptible to Rocephin therefore patient was treated with 5 days of antibiotics.  For several days now patient is pending placement   Assessment & Plan:   Principal Problem:   Dementia with behavioral disturbance Active Problems:   Blind   Optic nerve atrophy, bilateral   Essential hypertension, benign   Hearing loss secondary to cerumen impaction, bilateral   Memory loss   UTI (urinary tract infection)   Urinary tract infection, resolved -Urine cultures has grown Klebsiella pneumonia.  This is susceptible to Rocephin -Status post 5 days of Rocephin, discontinued 2/7  Behavioral disturbances due to dementia, intermittent -combative this morning but calm at the time of my eval.  - Treating his anxiety and agitation with as needed Ativan and Haldol -Currently pending placement.  Apparently he is going to need geriatric psychiatry inpatient hospitalization, social worker where he was working on this. Continue his other home medications -Maintain one-to-one sitter in place  BPH stable -Continue Flomax and finasteride  Essential hypertension Continue lisinopril and aspirin   DVT prophylaxis: Lovenox Code Status: DO NOT RESUSCITATE Family Communication: None at bedside Disposition Plan: Pending placement   Antimicrobials:   Status post 5 days Rocephin, last dose June 24, 2017   Subjective: Combative this morning but calm during my evaluation.  No other acute events overnight.  Vitals remained stable  Objective: Vitals:   06/30/17 2100 07/01/17 0932 07/01/17 1455  07/01/17 2040  BP: 118/66 (!) 135/97 128/74 (!) 142/80  Pulse: 65 65 66 64  Resp: 16  16 16   Temp: 98.2 F (36.8 C) 98.6 F (37 C) 98.4 F (36.9 C) 98 F (36.7 C)  TempSrc: Oral Oral Axillary Oral  SpO2: 96%  99% 98%  Weight:      Height:        Intake/Output Summary (Last 24 hours) at 07/02/2017 1008 Last data filed at 07/02/2017 0157 Gross per 24 hour  Intake 360 ml  Output 850 ml  Net -490 ml   Filed Weights   06/25/17 1321  Weight: 65.8 kg (145 lb)    Examination:  General exam: Appears calm and comfortable  Respiratory system: Clear to auscultation. Respiratory effort normal. Cardiovascular system: S1 & S2 heard, RRR. No JVD, murmurs, rubs, gallops or clicks. No pedal edema. Gastrointestinal system: Abdomen is nondistended, soft and nontender. No organomegaly or masses felt. Normal bowel sounds heard. Central nervous system: Alert and oriented. No focal neurological deficits. Extremities: Symmetric 5 x 5 power. Skin: No rashes, lesions or ulcers Psychiatry: Disorganized thoughts    Data Reviewed:   CBC: No results for input(s): WBC, NEUTROABS, HGB, HCT, MCV, PLT in the last 168 hours. Basic Metabolic Panel: No results for input(s): NA, K, CL, CO2, GLUCOSE, BUN, CREATININE, CALCIUM, MG, PHOS in the last 168 hours. GFR: Estimated Creatinine Clearance: 82.3 mL/min (A) (by C-G formula based on SCr of 0.58 mg/dL (L)). Liver Function Tests: No results for input(s): AST, ALT, ALKPHOS, BILITOT, PROT, ALBUMIN in the last 168 hours. No results for input(s): LIPASE, AMYLASE in the last 168 hours. No results for input(s): AMMONIA in the last 168 hours. Coagulation Profile: No results  for input(s): INR, PROTIME in the last 168 hours. Cardiac Enzymes: No results for input(s): CKTOTAL, CKMB, CKMBINDEX, TROPONINI in the last 168 hours. BNP (last 3 results) No results for input(s): PROBNP in the last 8760 hours. HbA1C: No results for input(s): HGBA1C in the last 72  hours. CBG: No results for input(s): GLUCAP in the last 168 hours. Lipid Profile: No results for input(s): CHOL, HDL, LDLCALC, TRIG, CHOLHDL, LDLDIRECT in the last 72 hours. Thyroid Function Tests: No results for input(s): TSH, T4TOTAL, FREET4, T3FREE, THYROIDAB in the last 72 hours. Anemia Panel: No results for input(s): VITAMINB12, FOLATE, FERRITIN, TIBC, IRON, RETICCTPCT in the last 72 hours. Sepsis Labs: No results for input(s): PROCALCITON, LATICACIDVEN in the last 168 hours.  Recent Results (from the past 240 hour(s))  Urine Culture     Status: Abnormal   Collection Time: 06/25/17  9:10 AM  Result Value Ref Range Status   Specimen Description   Final    URINE, CLEAN CATCH Performed at [facility], [place] [address] [place], [place] [address]    Special Requests   Final    NONE Performed at [facility], [place] [address], [place], [place] [address]    Culture >=100,000 COLONIES/mL KLEBSIELLA PNEUMONIAE (A)  Final   Report Status 06/27/2017 FINAL  Final   Organism ID, Bacteria KLEBSIELLA PNEUMONIAE (A)  Final      Susceptibility   Klebsiella pneumoniae - MIC*    AMPICILLIN >=32 RESISTANT Resistant     CEFAZOLIN <=4 SENSITIVE Sensitive     CEFTRIAXONE <=1 SENSITIVE Sensitive     CIPROFLOXACIN <=0.25 SENSITIVE Sensitive     GENTAMICIN <=1 SENSITIVE Sensitive     IMIPENEM <=0.25 SENSITIVE Sensitive     NITROFURANTOIN 128 RESISTANT Resistant     TRIMETH/SULFA <=20 SENSITIVE Sensitive     AMPICILLIN/SULBACTAM 8 SENSITIVE Sensitive     PIP/TAZO <=4 SENSITIVE Sensitive     Extended ESBL NEGATIVE Sensitive     * >=100,000 COLONIES/mL KLEBSIELLA PNEUMONIAE         Radiology Studies: No results found.      Scheduled Meds: . aspirin EC  81 mg Oral Daily  . cholecalciferol  1,000 Units Oral Daily  . citalopram  10 mg Oral Daily  . divalproex  250 mg Oral Daily  . divalproex  500 mg Oral QHS  . enoxaparin (LOVENOX) injection  40 mg  Subcutaneous Q24H  . finasteride  5 mg Oral Daily  . haloperidol  2 mg Oral TID  . hydrALAZINE  5 mg Intravenous Once  . lisinopril  20 mg Oral Daily  . LORazepam  1 mg Intravenous Once  . nicotine  14 mg Transdermal Daily  . tamsulosin  0.4 mg Oral QPC supper  . traZODone  50 mg Oral QHS   Continuous Infusions:   LOS: 7 days    Time spent:  15 mins     Kamica Florance Arsenio Loader, MD [facility] Pager [phone]   If 7PM-7AM, please contact night-coverage [email or web address] Password [email or web address] 07/02/2017, 10:08 AM

## 2017-07-03 NOTE — Progress Notes (Signed)
PROGRESS NOTE    Joe Dawson  AYT:[id number] DOB: 05-25-1950 DOA: 06/25/2017 PCP: Gayland Curry, DO   Brief Narrative:  67 year old with a history of essential hypertension, BPH, degenerative disc disease, dementia with behavioral changes, blindness came to the hospital for evaluation of increasing agitation and aggressive behavior.  Upon admission he was found to urinary tract infection which was susceptible to Rocephin therefore patient was treated with 5 days of antibiotics.  For several days now patient is pending placement   Assessment & Plan:   Principal Problem:   Dementia with behavioral disturbance Active Problems:   Blind   Optic nerve atrophy, bilateral   Essential hypertension, benign   Hearing loss secondary to cerumen impaction, bilateral   Memory loss   UTI (urinary tract infection)   Urinary tract infection, resolved -Urine cultures has grown Klebsiella pneumonia.  This is susceptible to Rocephin -Status post 5 days of Rocephin, discontinued 2/7  Behavioral disturbances due to dementia, intermittent -combative this morning but calm at the time of my eval.  - Treating his anxiety and agitation with as needed Ativan and Haldol -Currently pending placement.  Apparently he is going to need geriatric psychiatry inpatient hospitalization, social worker where he was working on this. Continue his other home medications -Maintain one-to-one sitter in place  BPH stable -Continue Flomax and finasteride  Essential hypertension Continue lisinopril and aspirin  Harmful to self and others therefore restraints in place.   DVT prophylaxis: Lovenox Code Status: DO NOT RESUSCITATE Family Communication: None at bedside Disposition Plan: Pending placement   Antimicrobials:   Status post 5 days Rocephin, last dose June 24, 2017   Subjective: Patient is calm this morning.  No complaints.  Objective: Vitals:   07/01/17 2040 07/02/17 1024 07/02/17 1256 07/03/17  0442  BP: (!) 142/80 115/72 118/61 132/77  Pulse: 64 81 65 60  Resp: 16 20 18 18   Temp: 98 F (36.7 C) 97.8 F (36.6 C) 98.5 F (36.9 C) 98 F (36.7 C)  TempSrc: Oral  Oral Oral  SpO2: 98% 100% 98% 96%  Weight:      Height:        Intake/Output Summary (Last 24 hours) at 07/03/2017 1211 Last data filed at 07/03/2017 1059 Gross per 24 hour  Intake 600 ml  Output 1450 ml  Net -850 ml   Filed Weights   06/25/17 1321  Weight: 65.8 kg (145 lb)    Examination:  General exam: Appears calm and comfortable; restraints are in place.  Respiratory system: Clear to auscultation. Respiratory effort normal. Cardiovascular system: S1 & S2 heard, RRR. No JVD, murmurs, rubs, gallops or clicks. No pedal edema. Gastrointestinal system: Abdomen is nondistended, soft and nontender. No organomegaly or masses felt. Normal bowel sounds heard. Central nervous system: Alert and oriented. No focal neurological deficits. Extremities: Symmetric 5 x 5 power. Skin: No rashes, lesions or ulcers Psychiatry: Disorganized thoughts    Data Reviewed:   CBC: No results for input(s): WBC, NEUTROABS, HGB, HCT, MCV, PLT in the last 168 hours. Basic Metabolic Panel: No results for input(s): NA, K, CL, CO2, GLUCOSE, BUN, CREATININE, CALCIUM, MG, PHOS in the last 168 hours. GFR: Estimated Creatinine Clearance: 82.3 mL/min (A) (by C-G formula based on SCr of 0.58 mg/dL (L)). Liver Function Tests: No results for input(s): AST, ALT, ALKPHOS, BILITOT, PROT, ALBUMIN in the last 168 hours. No results for input(s): LIPASE, AMYLASE in the last 168 hours. No results for input(s): AMMONIA in the last 168 hours. Coagulation  Profile: No results for input(s): INR, PROTIME in the last 168 hours. Cardiac Enzymes: No results for input(s): CKTOTAL, CKMB, CKMBINDEX, TROPONINI in the last 168 hours. BNP (last 3 results) No results for input(s): PROBNP in the last 8760 hours. HbA1C: No results for input(s): HGBA1C in the  last 72 hours. CBG: No results for input(s): GLUCAP in the last 168 hours. Lipid Profile: No results for input(s): CHOL, HDL, LDLCALC, TRIG, CHOLHDL, LDLDIRECT in the last 72 hours. Thyroid Function Tests: No results for input(s): TSH, T4TOTAL, FREET4, T3FREE, THYROIDAB in the last 72 hours. Anemia Panel: No results for input(s): VITAMINB12, FOLATE, FERRITIN, TIBC, IRON, RETICCTPCT in the last 72 hours. Sepsis Labs: No results for input(s): PROCALCITON, LATICACIDVEN in the last 168 hours.  Recent Results (from the past 240 hour(s))  Urine Culture     Status: Abnormal   Collection Time: 06/25/17  9:10 AM  Result Value Ref Range Status   Specimen Description   Final    URINE, CLEAN CATCH Performed at [facility], [place] [address] [place], [place] [address]    Special Requests   Final    NONE Performed at [facility], [place] [address] [place], [place] [address]    Culture >=100,000 COLONIES/mL KLEBSIELLA PNEUMONIAE (A)  Final   Report Status 06/27/2017 FINAL  Final   Organism ID, Bacteria KLEBSIELLA PNEUMONIAE (A)  Final      Susceptibility   Klebsiella pneumoniae - MIC*    AMPICILLIN >=32 RESISTANT Resistant     CEFAZOLIN <=4 SENSITIVE Sensitive     CEFTRIAXONE <=1 SENSITIVE Sensitive     CIPROFLOXACIN <=0.25 SENSITIVE Sensitive     GENTAMICIN <=1 SENSITIVE Sensitive     IMIPENEM <=0.25 SENSITIVE Sensitive     NITROFURANTOIN 128 RESISTANT Resistant     TRIMETH/SULFA <=20 SENSITIVE Sensitive     AMPICILLIN/SULBACTAM 8 SENSITIVE Sensitive     PIP/TAZO <=4 SENSITIVE Sensitive     Extended ESBL NEGATIVE Sensitive     * >=100,000 COLONIES/mL KLEBSIELLA PNEUMONIAE         Radiology Studies: No results found.      Scheduled Meds: . aspirin EC  81 mg Oral Daily  . cholecalciferol  1,000 Units Oral Daily  . citalopram  10 mg Oral Daily  . divalproex  250 mg Oral Daily  . divalproex  500 mg Oral QHS  . enoxaparin (LOVENOX)  injection  40 mg Subcutaneous Q24H  . finasteride  5 mg Oral Daily  . haloperidol  2 mg Oral TID  . hydrALAZINE  5 mg Intravenous Once  . lisinopril  20 mg Oral Daily  . LORazepam  1 mg Intravenous Once  . nicotine  14 mg Transdermal Daily  . tamsulosin  0.4 mg Oral QPC supper  . traZODone  50 mg Oral QHS   Continuous Infusions:   LOS: 8 days    Time spent:  15 mins     Joe Heal Arsenio Loader, MD [facility] Pager [phone]   If 7PM-7AM, please contact night-coverage [email or web address] Password [facility] 07/03/2017, 12:11 PM

## 2017-07-04 NOTE — Care Management Important Message (Signed)
Important Message  Patient Details  Name: Joe Dawson MRN: [id number] Date of Birth: 02-16-1951   Medicare Important Message Given:  Yes    Kerin Salen 07/04/2017, 1:05 [place] Message  Patient Details  Name: Joe Dawson MRN: [id number] Date of Birth: January 08, 1951   Medicare Important Message Given:  Yes    Kerin Salen 07/04/2017, 1:05 PM

## 2017-07-04 NOTE — Consult Note (Signed)
   [facility] CM Inpatient Consult   07/04/2017  C[place] RUDRANSH BELLANCA 08/04/1950 [id number]    [facility] Care Management follow up.   Chart reviewed. Mr. Wolman continues to have intermittent behavioral disturbance, according to chart notes. Spoke with psych inpatient LCSW who is following for geriatric psych placement.   Marthenia Rolling, MSN-Ed, RN,BSN [facility] Liaison [phone]

## 2017-07-04 NOTE — Progress Notes (Signed)
PROGRESS NOTE    Joe Dawson  JJK:[id number] DOB: 08/24/1950 DOA: 06/25/2017 PCP: Gayland Curry, DO   Brief Narrative:  67 year old with a history of essential hypertension, BPH, degenerative disc disease, dementia with behavioral changes, blindness came to the hospital for evaluation of increasing agitation and aggressive behavior.  Upon admission he was found to urinary tract infection which was susceptible to Rocephin therefore patient was treated with 5 days of antibiotics.  For several days now patient is pending placement   Assessment & Plan:   Principal Problem:   Dementia with behavioral disturbance Active Problems:   Blind   Optic nerve atrophy, bilateral   Essential hypertension, benign   Hearing loss secondary to cerumen impaction, bilateral   Memory loss   UTI (urinary tract infection)   Urinary tract infection, resolved -Urine cultures has grown Klebsiella pneumonia.  This is susceptible to Rocephin -Status post 5 days of Rocephin, discontinued 2/7  Intermittent behavioral disturbance -combative this morning but calm at the time of my eval.  - Treating his anxiety and agitation with as needed Ativan and Haldol -Currently pending placement.  Apparently he is going to need geriatric psychiatry inpatient hospitalization, social worker where he was working on this. Continue his other home medications -Maintain one-to-one sitter in place  BPH stable -Continue Flomax and finasteride  Essential hypertension Continue lisinopril and aspirin  Harmful to self and others therefore restraints in place.   DVT prophylaxis: Lovenox Code Status: DO NOT RESUSCITATE Family Communication: None at bedside Disposition Plan: Pending placement   Antimicrobials:   Status post 5 days Rocephin, last dose June 24, 2017   Subjective: Calm again to me this morning. No significant acute evetns overnight.   Objective: Vitals:   07/03/17 0442 07/03/17 1435 07/03/17 2053  07/04/17 0600  BP: 132/77 129/70 (!) 128/59 (!) 104/54  Pulse: 60 70 63 60  Resp: 18 18 18 18   Temp: 98 F (36.7 C) 98.2 F (36.8 C) 98.7 F (37.1 C) 98 F (36.7 C)  TempSrc: Oral Axillary Oral Oral  SpO2: 96% 99% 96% 97%  Weight:      Height:        Intake/Output Summary (Last 24 hours) at 07/04/2017 1120 Last data filed at 07/04/2017 0800 Gross per 24 hour  Intake 840 ml  Output 1025 ml  Net -185 ml   Filed Weights   06/25/17 1321  Weight: 65.8 kg (145 lb)    Examination:  General exam: Appears calm and comfortable; restraints are in place.  Respiratory system: Clear to auscultation. Respiratory effort normal. Cardiovascular system: S1 & S2 heard, RRR. No JVD, murmurs, rubs, gallops or clicks. No pedal edema. Gastrointestinal system: Abdomen is nondistended, soft and nontender. No organomegaly or masses felt. Normal bowel sounds heard. Central nervous system: Alert and oriented. No focal neurological deficits. Extremities: Symmetric 5 x 5 power. Skin: No rashes, lesions or ulcers Psychiatry: Disorganized thoughts    Data Reviewed:   CBC: No results for input(s): WBC, NEUTROABS, HGB, HCT, MCV, PLT in the last 168 hours. Basic Metabolic Panel: No results for input(s): NA, K, CL, CO2, GLUCOSE, BUN, CREATININE, CALCIUM, MG, PHOS in the last 168 hours. GFR: Estimated Creatinine Clearance: 82.3 mL/min (A) (by C-G formula based on SCr of 0.58 mg/dL (L)). Liver Function Tests: No results for input(s): AST, ALT, ALKPHOS, BILITOT, PROT, ALBUMIN in the last 168 hours. No results for input(s): LIPASE, AMYLASE in the last 168 hours. No results for input(s): AMMONIA in the last 168  hours. Coagulation Profile: No results for input(s): INR, PROTIME in the last 168 hours. Cardiac Enzymes: No results for input(s): CKTOTAL, CKMB, CKMBINDEX, TROPONINI in the last 168 hours. BNP (last 3 results) No results for input(s): PROBNP in the last 8760 hours. HbA1C: No results for  input(s): HGBA1C in the last 72 hours. CBG: No results for input(s): GLUCAP in the last 168 hours. Lipid Profile: No results for input(s): CHOL, HDL, LDLCALC, TRIG, CHOLHDL, LDLDIRECT in the last 72 hours. Thyroid Function Tests: No results for input(s): TSH, T4TOTAL, FREET4, T3FREE, THYROIDAB in the last 72 hours. Anemia Panel: No results for input(s): VITAMINB12, FOLATE, FERRITIN, TIBC, IRON, RETICCTPCT in the last 72 hours. Sepsis Labs: No results for input(s): PROCALCITON, LATICACIDVEN in the last 168 hours.  Recent Results (from the past 240 hour(s))  Urine Culture     Status: Abnormal   Collection Time: 06/25/17  9:10 AM  Result Value Ref Range Status   Specimen Description   Final    URINE, CLEAN CATCH Performed at [facility], [place] [address], [place], [place] [address]    Special Requests   Final    NONE Performed at [facility], [place] [address], [place], [place] [address]    Culture >=100,000 COLONIES/mL KLEBSIELLA PNEUMONIAE (A)  Final   Report Status 06/27/2017 FINAL  Final   Organism ID, Bacteria KLEBSIELLA PNEUMONIAE (A)  Final      Susceptibility   Klebsiella pneumoniae - MIC*    AMPICILLIN >=32 RESISTANT Resistant     CEFAZOLIN <=4 SENSITIVE Sensitive     CEFTRIAXONE <=1 SENSITIVE Sensitive     CIPROFLOXACIN <=0.25 SENSITIVE Sensitive     GENTAMICIN <=1 SENSITIVE Sensitive     IMIPENEM <=0.25 SENSITIVE Sensitive     NITROFURANTOIN 128 RESISTANT Resistant     TRIMETH/SULFA <=20 SENSITIVE Sensitive     AMPICILLIN/SULBACTAM 8 SENSITIVE Sensitive     PIP/TAZO <=4 SENSITIVE Sensitive     Extended ESBL NEGATIVE Sensitive     * >=100,000 COLONIES/mL KLEBSIELLA PNEUMONIAE         Radiology Studies: No results found.      Scheduled Meds: . aspirin EC  81 mg Oral Daily  . cholecalciferol  1,000 Units Oral Daily  . citalopram  10 mg Oral Daily  . divalproex  250 mg Oral Daily  . divalproex  500 mg Oral QHS  .  enoxaparin (LOVENOX) injection  40 mg Subcutaneous Q24H  . finasteride  5 mg Oral Daily  . haloperidol  2 mg Oral TID  . hydrALAZINE  5 mg Intravenous Once  . lisinopril  20 mg Oral Daily  . nicotine  14 mg Transdermal Daily  . tamsulosin  0.4 mg Oral QPC supper  . traZODone  50 mg Oral QHS   Continuous Infusions:   LOS: 9 days    Time spent:  15 mins     Ankit Arsenio Loader, MD [facility] Pager [phone]   If 7PM-7AM, please contact night-coverage [email or web address] Password [facility] 07/04/2017, 11:20 AM

## 2017-07-05 NOTE — Progress Notes (Addendum)
  PROGRESS NOTE  Joe Dawson  HMC:[id number] DOB: 1951-01-31 DOA: 06/25/2017   PCP: Gayland Curry, DO   Brief Narrative:  67 year old with a history of essential hypertension, BPH, degenerative disc disease, dementia with behavioral changes, blindness came to the hospital for evaluation of increasing agitation and aggressive behavior.  Upon admission he was found to urinary tract infection which was susceptible to Rocephin therefore patient was treated with 5 days of antibiotics.  For several days now patient is pending placement  Assessment & Plan:  Urinary tract infection, resolved - Urine cultures has grown Klebsiella pneumonia.  This is susceptible to Rocephin - pt completed 5 days of Rocephin, discontinued 02/07  Intermittent behavioral disturbance - calm on exam this AM, no agitation  - use ativan or Haldol as needed  - placement pending  - 1:1 observation   BPH stable - continue Flomax and Finasteride   Essential hypertension - continue Lisinopril    DVT prophylaxis: Lovenox SQ Code Status: DNR Family Communication: pt at bedside  Disposition Plan: placement pending   Antimicrobials:   Status post 5 days Rocephin, last dose June 24, 2017  Subjective: No events overnight.   Objective: Vitals:   07/04/17 0600 07/04/17 1543 07/04/17 2142 07/05/17 0500  BP: (!) 104/54 111/70 130/71 111/62  Pulse: 60 63 (!) 56 68  Resp: 18 18 16 17   Temp: 98 F (36.7 C) 99.1 F (37.3 C) 98.4 F (36.9 C) 98.7 F (37.1 C)  TempSrc: Oral Oral Oral Oral  SpO2: 97% 98% 97% 99%  Weight:      Height:        Intake/Output Summary (Last 24 hours) at 07/05/2017 1448 Last data filed at 07/05/2017 1241 Gross per 24 hour  Intake 960 ml  Output 650 ml  Net 310 ml   Filed Weights   06/25/17 1321  Weight: 65.8 kg (145 lb)   Physical Exam  Constitutional: Appears calm, NAD CVS: RRR, S1/S2 +, no murmurs, no gallops, no carotid bruit.  Pulmonary: Effort and breath sounds normal,  no stridor, rhonchi, wheezes, rales.  Abdominal: Soft. BS +,  no distension, tenderness, rebound or guarding.  Musculoskeletal: Normal range of motion. No edema and no tenderness.  Psychiatric: stable affect   Radiology Studies: No results found.   Scheduled Meds: . aspirin EC  81 mg Oral Daily  . cholecalciferol  1,000 Units Oral Daily  . citalopram  10 mg Oral Daily  . divalproex  250 mg Oral Daily  . divalproex  500 mg Oral QHS  . enoxaparin (LOVENOX) injection  40 mg Subcutaneous Q24H  . finasteride  5 mg Oral Daily  . haloperidol  2 mg Oral TID  . hydrALAZINE  5 mg Intravenous Once  . lisinopril  20 mg Oral Daily  . nicotine  14 mg Transdermal Daily  . tamsulosin  0.4 mg Oral QPC supper  . traZODone  50 mg Oral QHS   Continuous Infusions:   LOS: 10 days   Time spent:  25 minutes   Faye Ramsay, MD [facility] Pager [phone]  If 7PM-7AM, please contact night-coverage [email or web address] Password [facility] 07/05/2017, 2:48 PM

## 2017-07-05 NOTE — Progress Notes (Addendum)
LCSW following for inpatient psych placement.  LCSW made central regional referral.  [facility] authorization [id number] from 2/13-2/19  Patient will need updated psych note.  LCSW updated psych.   Joe Dawson [place]

## 2017-07-06 MED ORDER — LIP MEDEX EX OINT
TOPICAL_OINTMENT | CUTANEOUS | Status: AC
  Administered 2017-07-06: 18:00:00
  Filled 2017-07-06: qty 7

## 2017-07-06 NOTE — Progress Notes (Addendum)
CSW confirmed patient is still on [facility] [facility] wait list. Per nurse, patient has no active behaviors documented at this time.  Physical Therapy has been consulted.   -CSW working with Psych social worker to assist with pt. discharge plan.   Per psychiatrist evalution-patient no longer meets criteria for psychiatric placement.    -CSW completed PASSRR, waiting for review. Pending status.  -CSW discussed facility options with leadership.       Kathrin Greathouse, Latanya Presser, MSW Clinical Social Worker  [phone] 07/06/2017  11:58 AM

## 2017-07-06 NOTE — Progress Notes (Signed)
  PROGRESS NOTE  Joe Dawson  VQM:[id number] DOB: 1950-11-11 DOA: 06/25/2017   PCP: Gayland Curry, DO   Brief Narrative:  67 year old with a history of essential hypertension, BPH, degenerative disc disease, dementia with behavioral changes, blindness came to the hospital for evaluation of increasing agitation and aggressive behavior.  Upon admission he was found to urinary tract infection which was susceptible to Rocephin therefore patient was treated with 5 days of antibiotics.  For several days now patient is pending placement  Assessment & Plan:  Urinary tract infection, resolved - Urine cultures has grown Klebsiella pneumonia.  This is susceptible to Rocephin - pt completed 5 days of Rocephin, discontinued 02/07  Intermittent behavioral disturbance - calm on exam this AM, no agitation  - use ativan or Haldol as needed  - placement pending - keep sitter at bedside   BPH stable - continue Flomax and Finasteride   Essential hypertension - continue Lisinopril  - reasonable inpatient control   DVT prophylaxis: Lovenox SQ Code Status: DNR Family Communication: pt at bedside  Disposition Plan: placement pending   Antimicrobials:   Status post 5 days Rocephin, last dose June 24, 2017  Subjective: No events overnight.   Objective: Vitals:   07/05/17 1520 07/05/17 2218 07/06/17 0640 07/06/17 0841  BP: 123/69 138/77 119/64 122/72  Pulse: 60 (!) 51 (!) 51 (!) 53  Resp: 18 18 18 16   Temp: 98.6 F (37 C) 98.7 F (37.1 C) 97.7 F (36.5 C) 98.3 F (36.8 C)  TempSrc: Oral Oral Oral   SpO2: 97% 96% 96% 98%  Weight:      Height:        Intake/Output Summary (Last 24 hours) at 07/06/2017 1313 Last data filed at 07/06/2017 1249 Gross per 24 hour  Intake 720 ml  Output 1250 ml  Net -530 ml   Filed Weights   06/25/17 1321  Weight: 65.8 kg (145 lb)   Physical Exam  Constitutional: Appears well-developed and well-nourished. No distress.  CVS: RRR, S1/S2 +, no  murmurs, no gallops, no carotid bruit.  Pulmonary: Effort and breath sounds normal, no stridor, rhonchi, wheezes, rales.  Abdominal: Soft. BS +,  no distension, tenderness, rebound or guarding.  Musculoskeletal: Normal range of motion. No edema and no tenderness.  Psychiatric: normal affect   Radiology Studies: No results found.   Scheduled Meds: . aspirin EC  81 mg Oral Daily  . cholecalciferol  1,000 Units Oral Daily  . citalopram  10 mg Oral Daily  . divalproex  250 mg Oral Daily  . divalproex  500 mg Oral QHS  . enoxaparin (LOVENOX) injection  40 mg Subcutaneous Q24H  . finasteride  5 mg Oral Daily  . haloperidol  2 mg Oral TID  . hydrALAZINE  5 mg Intravenous Once  . lisinopril  20 mg Oral Daily  . nicotine  14 mg Transdermal Daily  . tamsulosin  0.4 mg Oral QPC supper  . traZODone  50 mg Oral QHS   Continuous Infusions:   LOS: 11 days   Time spent:  15 minutes   Faye Ramsay, MD [facility] Pager [phone]  If 7PM-7AM, please contact night-coverage [email or web address] Password [email or web address] 07/06/2017, 1:13 PM

## 2017-07-06 NOTE — Progress Notes (Signed)
Nutrition Brief Note  Patient identified for LOS (day 11).  Wt Readings from Last 15 Encounters:  06/25/17 145 lb (65.8 kg)  05/28/17 140 lb (63.5 kg)  11/21/16 142 lb (64.4 kg)  08/05/16 143 lb (64.9 kg)  05/09/16 141 lb 12.8 oz (64.3 kg)  11/21/14 137 lb 3.2 oz (62.2 kg)  01/23/14 139 lb 9.6 oz (63.3 kg)  08/09/13 140 lb (63.5 kg)  06/29/11 145 lb (65.8 kg)  06/16/11 145 lb 1.6 oz (65.8 kg)    Body mass index is 22.71 kg/m. Patient meets criteria for normal weight based on current BMI. Per chart review, pt weighed 150 lbs on 05/31/17 at [facility]. This indicates 5 lb weight loss (3.3% body weight) in 1-1.5 months which is not significant for time frame. Skin WDL. Pt with PMH of HTN, BPH, degenerative disc disease, dementia with behavioral changes, blindness. He came to the ED for increased agitation and aggressive behavior. Per Dr. Doyle Askew note yesterday afternoon, pt is pending placement.  Current diet order is 2 gram Na. Patient is mainly consuming 100% of meals. Per review, pt consumed 1746 kcal, 68 grams of protein on 2/12; 1441 kcal, 62 grams of protein on 2/13; 455 kcal, 19 grams of protein for breakfast this AM.  Medications reviewed. Labs reviewed; CBG: 91 mg/dL this AM, creatinine: 0.58 mg/dL, Ca: 8.6 mg/dL.   No nutrition interventions warranted at this time. If nutrition issues arise, please consult RD.      Jarome Matin, MS, RD, LDN, [facility] Inpatient Clinical Dietitian Pager # [phone] After hours/weekend pager # [phone]

## 2017-07-06 NOTE — Consult Note (Signed)
[facility] Psych Consult Progress Note  07/06/2017 11:41 AM Joe Dawson  MRN:  [id number] Subjective:   Joe Dawson reports that he is doing pretty good. He reports no problems with sleep or appetite. He was actually napping prior to interview. He denies SI, HI or AVH. His sitter reports that he has been calm today. He appears much improved since last week when she saw him. He has been listening to the television. His wife reportedly told her that he is back to his baseline and this is how he was at home prior to his decline.   Principal Problem: Dementia with behavioral disturbance Diagnosis:   Patient Active Problem List   Diagnosis Date Noted  . UTI (urinary tract infection) [N39.0] 06/25/2017  . Dementia with behavioral disturbance [F03.91] 05/28/2017  . 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 Memory loss [R41.3] 11/21/2016  . Essential hypertension, benign [I10] 11/21/2014  . 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 Hyperlipidemia LDL goal <100 [E78.5] 08/09/2013  . Blind [H54.7] 08/09/2013  . Vitamin D insufficiency [E55.9] 08/09/2013  . Benign prostatic hypertrophy [N40.0] 08/09/2013  . Hyperglycemia [R73.9] 08/09/2013  . Tobacco abuse [Z72.0] 08/09/2013   Total Time spent with patient: 15 minutes  Past Psychiatric History: Dementia with behavioral disturbance  Past Medical History:  Past Medical History:  Diagnosis Date  . Arthritis   . Blindness - both eyes   . BPH (benign prostatic hyperplasia)   . Hyperlipidemia   . Hypertension   . Tobacco abuse     Past Surgical History:  Procedure Laterality Date  . bilateral inguinal hernia  1974   Dr. Viona Gilmore  . broken foot bone    . broken hand  1998   Family History:  Family History  Problem Relation Age of Onset  . Cancer Mother        Breast  . Heart disease Father 53  . Colon cancer Neg Hx   . Stomach cancer Neg Hx    Family Psychiatric  History: Unknown Social  History:  Social History   Substance and Sexual Activity  Alcohol Use No     Social History   Substance and Sexual Activity  Drug Use Yes  . Types: Marijuana   Comment: Quit back in [place] History   Socioeconomic History  . Marital status: Married    Spouse name: None  . Number of children: None  . Years of education: None  . Highest education level: None  Social Needs  . Financial resource strain: None  . Food insecurity - worry: None  . Food insecurity - inability: None  . Transportation needs - medical: None  . Transportation needs - non-medical: None  Occupational History  . None  Tobacco Use  . Smoking status: Current Every Day Smoker    Packs/day: 0.50    Types: Cigarettes  . Smokeless tobacco: Never Used  Substance and Sexual Activity  . Alcohol use: No  . Drug use: Yes    Types: Marijuana    Comment: Quit back in [place]   . Sexual activity: Yes  Other Topics Concern  . None  Social History Narrative  . None    Sleep: Good  Appetite:  Good  Current Medications: Current Facility-Administered Medications  Medication Dose Route Frequency Provider Last Rate Last Dose  . acetaminophen (TYLENOL) tablet 650 mg  650 mg Oral Q6H PRN [name], MD   650 mg at 07/02/17 1019  .  aspirin EC tablet 81 mg  81 mg Oral Daily [name], MD   81 mg at 07/06/17 0844  . cholecalciferol (VITAMIN D) tablet 1,000 Units  1,000 Units Oral Daily [name], MD   1,000 Units at 07/06/17 0844  . citalopram (CELEXA) tablet 10 mg  10 mg Oral Daily [name], MD   10 mg at 07/06/17 0845  . divalproex (DEPAKOTE SPRINKLE) capsule 250 mg  250 mg Oral Daily Minda Ditto, RPH   250 mg at 07/06/17 0845  . divalproex (DEPAKOTE SPRINKLE) capsule 500 mg  500 mg Oral QHS Minda Ditto, RPH   500 mg at 07/05/17 2152  . enoxaparin (LOVENOX) injection 40 mg  40 mg Subcutaneous Q24H [name], MD   40 mg at 07/05/17 1634  . finasteride  (PROSCAR) tablet 5 mg  5 mg Oral Daily [name], MD   5 mg at 07/06/17 0846  . haloperidol (HALDOL) tablet 2 mg  2 mg Oral TID Damita Lack, MD   2 mg at 07/06/17 0846  . haloperidol lactate (HALDOL) injection 5 mg  5 mg Intramuscular Q6H PRN [name], MD   5 mg at 07/01/17 0518  . hydrALAZINE (APRESOLINE) injection 5 mg  5 mg Intravenous Once Schorr, Rhetta Mura, NP      . lisinopril (PRINIVIL,ZESTRIL) tablet 20 mg  20 mg Oral Daily [name], MD   20 mg at 07/06/17 0844  . LORazepam (ATIVAN) tablet 0.5 mg  0.5 mg Oral BID PRN Patrecia Pour, MD   0.5 mg at 07/03/17 0432  . nicotine (NICODERM CQ - dosed in mg/24 hours) patch 14 mg  14 mg Transdermal Daily Amin, Ankit Chirag, MD   14 mg at 07/06/17 0900  . tamsulosin (FLOMAX) capsule 0.4 mg  0.4 mg Oral QPC supper [name], MD   0.4 mg at 07/05/17 1634  . traZODone (DESYREL) tablet 50 mg  50 mg Oral QHS [name], MD   50 mg at 07/05/17 2152    Lab Results: No results found for this or any previous visit (from the past 48 hour(s)).  Blood Alcohol level:  Lab Results  Component Value Date   ETH <10 06/25/2017   ETH <10 05/28/2017    Musculoskeletal: Strength & Muscle Tone: decreased Gait & Station: UTA since patient was lying in bed. Patient leans: N/A  Psychiatric Specialty Exam: Physical Exam  Nursing note and vitals reviewed. Constitutional: He appears well-developed and well-nourished.  HENT:  Head: Normocephalic and atraumatic.  Neck: Normal range of motion.  Respiratory: Effort normal.  Musculoskeletal: Normal range of motion.  Neurological: He is alert.  Only oriented to person and Valentine's Day.   Psychiatric: He has a normal mood and affect. His speech is normal and behavior is normal. Thought content normal. Cognition and memory are impaired.    Review of Systems  Constitutional: Negative for chills and fever.  Cardiovascular: Negative for chest pain.   Gastrointestinal: Negative for abdominal pain, constipation, diarrhea, nausea and vomiting.  Psychiatric/Behavioral: Negative for depression, hallucinations, substance abuse and suicidal ideas. The patient is not nervous/anxious and does not have insomnia.     Blood pressure 122/72, pulse (!) 53, temperature 98.3 F (36.8 C), resp. rate 16, height 5\' 7"  (1.702 m), weight 65.8 kg (145 lb), SpO2 98 %.Body mass index is 22.71 kg/m.  General Appearance: Fairly Groomed, elderly, Caucasian male, wearing a hospital gown with unshaved face and  lying in bed. NAD.   Eye Contact:  Patient is blind but does appear to have direct eye contact.  Speech:  Clear and Coherent and Normal Rate  Volume:  Normal  Mood:  Euthymic  Affect:  Appropriate and Congruent  Thought Process:  Linear and Descriptions of Associations: Intact  Orientation:  Other:  Person and aware Valentine's Day.  Thought Content:  Logical  Suicidal Thoughts:  No  Homicidal Thoughts:  No  Memory:  Immediate;   Poor Recent;   Fair Remote;   Fair  Judgement:  Poor  Insight:  Poor  Psychomotor Activity:  Normal  Concentration:  Concentration: Fair and Attention Span: Fair  Recall:  Poor  Fund of Knowledge:  Fair  Language:  Fair  Akathisia:  No  Handed:  Right  AIMS (if indicated):   N/A  Assets:  Financial Resources/Insurance Housing Social Support  ADL's:  Impaired  Cognition:  Impaired. History of dementia.   Sleep:   Okay    Assessment:  Joe Dawson is a 67 y.o. male who was admitted with agitation and psychosis and found to have an UTI. He has been effectively treated with antibiotics. He has been relatively calm on his psychotropic medication regimen for the past couple days. He has intermittent periods of agitation that are brief. He has dementia with symptoms consistent with sundowning. His wife is no longer able to care for him at home as he is fully dependent on assistance with his ADLs. At this time, he does not  warrant inpatient geropsychiatric hospitalization and he would be more appropriate for discharge to a SNF with a memory care unit for assistance with ADLs and ongoing medical needs.  Treatment Plan Summary: -Continue Celexa 10 mg daily for agitation and depression, Depakote 250/500 mg for mood stabilization, Haldol 2 mg TID for psychosis and Trazodone 50 mg qhs for insomnia. -Patient is psychiatrically cleared. Psychiatry will sign off at this time. Please consult psychiatry again as needed.     Faythe Dingwall, DO 07/06/2017, 11:41 AM

## 2017-07-06 NOTE — Evaluation (Addendum)
Physical Therapy Evaluation Patient Details Name: Joe Dawson MRN: [id number] DOB: 22-Sep-1950 Today's Date: 07/06/2017   History of Present Illness  67 year old with a history of essential hypertension, BPH, degenerative disc disease, dementia with behavioral changes, blindness came to the hospital 06/25/17 for evaluation of increasing agitation and aggressive behavior.  Upon admission he was found to urinary tract infection  Clinical Impression  The patient was calm and able to participate in mobility  And standing at the RW with 2 assisting. Balance in sitting and standing poor with retropulsion. Pt admitted with above diagnosis. Pt currently with functional limitations due to the deficits listed below (see PT Problem List).  Pt will benefit from skilled PT to increase their independence and safety with mobility to allow discharge to the venue listed below.       Follow Up Recommendations SNF   Equipment Recommendations  none   Recommendations for Other Services       Precautions / Restrictions Precautions Precautions: Fall Precaution Comments: blindness, recent h/o aggressiveness      Mobility  Bed Mobility Overal bed mobility: Needs Assistance Bed Mobility: Supine to Sit;Sit to Supine     Supine to sit: Mod assist Sit to supine: Mod assist   General bed mobility comments: extra time, multimodal cues  for legs and trunk.  Transfers Overall transfer level: Needs assistance Equipment used: Rolling walker (2 wheeled) Transfers: Sit to/from Stand Sit to Stand: Max assist;+2 physical assistance;+2 safety/equipment         General transfer comment: assist to rise from bed, RW is picked and not pushed into it for stability. PT had to  stabilize RW. small side stepping attemps along the bed but does not unweight the  legs to actually move, feet sliding forward.  Ambulation/Gait                Stairs            Wheelchair Mobility    Modified Rankin (Stroke  Patients Only)       Balance Overall balance assessment: Needs assistance;History of Falls Sitting-balance support: Feet supported;Bilateral upper extremity supported Sitting balance-Leahy Scale: Poor Sitting balance - Comments: posterior lean Postural control: Posterior lean Standing balance support: During functional activity;Bilateral upper extremity supported Standing balance-Leahy Scale: Zero                               Pertinent Vitals/Pain Pain Assessment: No/denies pain    Home Living Family/patient expects to be discharged to:: Private residence([facility]) Living Arrangements: Spouse/significant other               Additional Comments: unknown prior function, no family present.    Prior Function                 Hand Dominance        Extremity/Trunk Assessment   Upper Extremity Assessment Upper Extremity Assessment: Generalized weakness    Lower Extremity Assessment Lower Extremity Assessment: Generalized weakness;RLE deficits/detail;LLE deficits/detail RLE Deficits / Details: ankles tend to be plantar flexed when standing, bears weight LLE Deficits / Details: same    Cervical / Trunk Assessment Cervical / Trunk Assessment: Other exceptions Cervical / Trunk Exceptions: leans back  Communication   Communication: No difficulties  Cognition Arousal/Alertness: Awake/alert Behavior During Therapy: WFL for tasks assessed/performed Overall Cognitive Status: History of cognitive impairments - at baseline  General Comments: oriented to Joe Dawson      General Comments      Exercises     Assessment/Plan    PT Assessment Patient needs continued PT services  PT Problem List Decreased strength;Decreased cognition;Decreased knowledge of use of DME;Decreased activity tolerance;Decreased safety awareness;Decreased balance;Decreased knowledge of precautions;Decreased mobility       PT  Treatment Interventions DME instruction;Gait training;Therapeutic exercise;Balance training;Functional mobility training;Therapeutic activities    PT Goals (Current goals can be found in the Care Plan section)  Acute Rehab PT Goals PT Goal Formulation: Patient unable to participate in goal setting Time For Goal Achievement: 07/20/17 Potential to Achieve Goals: Fair    Frequency Min 2X/week   Barriers to discharge Decreased caregiver support      Co-evaluation               AM-PAC PT "6 Clicks" Daily Activity  Outcome Measure Difficulty turning over in bed (including adjusting bedclothes, sheets and blankets)?: Unable Difficulty moving from lying on back to sitting on the side of the bed? : Unable Difficulty sitting down on and standing up from a chair with arms (e.g., wheelchair, bedside commode, etc,.)?: Unable Help needed moving to and from a bed to chair (including a wheelchair)?: Total Help needed walking in hospital room?: Total Help needed climbing 3-5 steps with a railing? : Total 6 Click Score: 6    End of Session Equipment Utilized During Treatment: Gait belt Activity Tolerance: Patient tolerated treatment well Patient left: in bed;with call bell/phone within reach;with nursing/sitter in room Nurse Communication: Mobility status PT Visit Diagnosis: Unsteadiness on feet (R26.81);History of falling (Z91.81)    Time: 5597-4163 PT Time Calculation (min) (ACUTE ONLY): 13 min   Charges:   PT Evaluation $PT Eval Low Complexity: 1 Low     PT G CodesTresa Endo PT [phone]   Claretha Cooper 07/06/2017, 4:39 PM

## 2017-07-07 DIAGNOSIS — F0391 Unspecified dementia with behavioral disturbance: Principal | ICD-10-CM

## 2017-07-07 NOTE — Progress Notes (Signed)
Patient became increasingly agitated this am. Patient requesting to get out of bed and go home. NT's attempted x2 to ambulate patient to chair. Patient attempting to bite and pinch staff. Given 5mg  IM Haldol. Engineer, production. Patient is currently resting calmly asking "when can I get these off". Patient educated on indications for mitts and when they are able to come off.

## 2017-07-07 NOTE — Progress Notes (Signed)
  PROGRESS NOTE  Joe Dawson  ONG:[id number] DOB: 12-Dec-1950 DOA: 06/25/2017   PCP: Gayland Curry, DO   Brief Narrative:  67 year old with a history of essential hypertension, BPH, degenerative disc disease, dementia with behavioral changes, blindness came to the hospital for evaluation of increasing agitation and aggressive behavior.  Upon admission he was found to urinary tract infection which was susceptible to Rocephin therefore patient was treated with 5 days of antibiotics.  For several days now patient is pending placement  Assessment & Plan:  Urinary tract infection, resolved - Urine cultures has grown Klebsiella pneumonia.  This is susceptible to Rocephin - pt completed 5 days of Rocephin, discontinued 02/07  Intermittent behavioral disturbance - calm on exam this AM, no agitation  - use ativan or Haldol as needed  - placement pending - keep sitter at bedside   BPH stable - continue Flomax and Finasteride   Essential hypertension - continue Lisinopril  - reasonable inpatient control   DVT prophylaxis: Lovenox SQ Code Status: DNR Family Communication: no family at bedside  Disposition Plan: placement pending   Antimicrobials:   Status post 5 days Rocephin, last dose June 24, 2017  Subjective: No concerns this AM.  Objective: Vitals:   07/06/17 0841 07/06/17 1436 07/06/17 2201 07/07/17 0633  BP: 122/72 107/63 (!) 155/82 133/77  Pulse: (!) 53 65 71 80  Resp: 16 15 16 16   Temp: 98.3 F (36.8 C) 98.5 F (36.9 C) 98.6 F (37 C) (!) 97.5 F (36.4 C)  TempSrc:  Axillary Oral Oral  SpO2: 98% 96% 98% 99%  Weight:      Height:        Intake/Output Summary (Last 24 hours) at 07/07/2017 0833 Last data filed at 07/07/2017 0547 Gross per 24 hour  Intake 1800 ml  Output 3150 ml  Net -1350 ml   Filed Weights   06/25/17 1321  Weight: 65.8 kg (145 lb)   Physical Exam  Constitutional: Appears calm, resting, NAD CVS: RRR, S1/S2 +, no murmurs, no gallops, no  carotid bruit.  Pulmonary: Effort and breath sounds normal, no stridor, rhonchi, wheezes, rales.  Abdominal: Soft. BS +,  no distension, tenderness, rebound or guarding.  Musculoskeletal: Normal range of motion. No edema and no tenderness.   Radiology Studies: No results found.   Scheduled Meds: . aspirin EC  81 mg Oral Daily  . cholecalciferol  1,000 Units Oral Daily  . citalopram  10 mg Oral Daily  . divalproex  250 mg Oral Daily  . divalproex  500 mg Oral QHS  . enoxaparin (LOVENOX) injection  40 mg Subcutaneous Q24H  . finasteride  5 mg Oral Daily  . haloperidol  2 mg Oral TID  . hydrALAZINE  5 mg Intravenous Once  . lisinopril  20 mg Oral Daily  . nicotine  14 mg Transdermal Daily  . tamsulosin  0.4 mg Oral QPC supper  . traZODone  50 mg Oral QHS   Continuous Infusions:   LOS: 12 days   Time spent:  15 minutes   Faye Ramsay, MD [facility] Pager [phone]  If 7PM-7AM, please contact night-coverage [email or web address] Password [facility] 07/07/2017, 8:33 AM

## 2017-07-07 NOTE — Progress Notes (Signed)
Consult- SNF  Barriers to discharge.-  No SNF bed available.  CSW faxed requested documentation to [facility] for state PASRR.  Patient still has a Actuary at bedside.   1:30pm CSW provided updates to patient spouse. Patient spouse very tearful. CSW provided emotional support.   CSW will continue to assist with discharge to SNF.    Kathrin Greathouse, Latanya Presser, MSW Clinical Social Worker  [phone] 07/07/2017  4:19 PM

## 2017-07-08 NOTE — Progress Notes (Signed)
  PROGRESS NOTE  Joe Dawson  NLZ:[id number] DOB: [birth year] DOA: 06/25/2017   PCP: Gayland Curry, DO   Brief Narrative:  67 year old with a history of essential hypertension, BPH, degenerative disc disease, dementia with behavioral changes, blindness came to the hospital for evaluation of increasing agitation and aggressive behavior.  Upon admission he was found to urinary tract infection which was susceptible to Rocephin therefore patient was treated with 5 days of antibiotics.  For several days now patient is pending placement  Assessment & Plan:  Urinary tract infection, resolved - Urine cultures has grown Klebsiella pneumonia.  This is susceptible to Rocephin - pt completed 5 days of Rocephin, discontinued 02/07  Intermittent behavioral disturbance - pt calm this am, no agitation, following commands  - use ativan or Haldol as needed  - placement pending   BPH stable - continue Flomax and Finasteride   Essential hypertension - continue Lisinopril  - reasonable inpatient control   DVT prophylaxis: Lovenox SQ Code Status: DNR Family Communication: no family at bedside  Disposition Plan: placement pending   Antimicrobials:   Status post 5 days Rocephin, last dose June 24, 2017  Subjective: No concerns this AM.  Objective: Vitals:   07/07/17 1054 07/07/17 1429 07/07/17 2137 07/08/17 0558  BP: 133/71 133/82 (!) 152/78 130/84  Pulse: 68 (!) 56 (!) 56 (!) 53  Resp: 18 18 16 15   Temp: 98.5 F (36.9 C) 98.4 F (36.9 C) 99 F (37.2 C) 98.3 F (36.8 C)  TempSrc: Oral Axillary Oral Oral  SpO2: 96% 97% 96% 98%  Weight:      Height:        Intake/Output Summary (Last 24 hours) at 07/08/2017 0917 Last data filed at 07/08/2017 0558 Gross per 24 hour  Intake 1440 ml  Output 1575 ml  Net -135 ml   Filed Weights   06/25/17 1321  Weight: 65.8 kg (145 lb)   Physical Exam  Constitutional: Appears calm, NAD CVS: bradycardia, S1/S2 +, no murmurs, no gallops, no  carotid bruit.  Pulmonary: Effort and breath sounds normal, no stridor, rhonchi, wheezes, rales.  Abdominal: Soft. BS +,  no distension, tenderness, rebound or guarding.  Musculoskeletal: Normal range of motion. No edema and no tenderness.   Radiology Studies: No results found.   Scheduled Meds: . aspirin EC  81 mg Oral Daily  . cholecalciferol  1,000 Units Oral Daily  . citalopram  10 mg Oral Daily  . divalproex  250 mg Oral Daily  . divalproex  500 mg Oral QHS  . enoxaparin (LOVENOX) injection  40 mg Subcutaneous Q24H  . finasteride  5 mg Oral Daily  . haloperidol  2 mg Oral TID  . hydrALAZINE  5 mg Intravenous Once  . lisinopril  20 mg Oral Daily  . nicotine  14 mg Transdermal Daily  . tamsulosin  0.4 mg Oral QPC supper  . traZODone  50 mg Oral QHS   Continuous Infusions:   LOS: 13 days   Time spent:  15 minutes   Faye Ramsay, MD [facility] Pager [phone]  If 7PM-7AM, please contact night-coverage [email or web address] Password [facility] 07/08/2017, 9:17 AM

## 2017-07-09 MED ORDER — LORAZEPAM 0.5 MG PO TABS: 0.5000 mg | ORAL_TABLET | Freq: Every day | ORAL | 0 refills | Status: DC | PRN

## 2017-07-09 NOTE — Progress Notes (Signed)
  PROGRESS NOTE  Joe Dawson  [id number]:[id number] DOB: 06-Sep-1950 DOA: 06/25/2017   PCP: Gayland Curry, DO   Brief Narrative:  67 year old with a history of essential hypertension, BPH, degenerative disc disease, dementia with behavioral changes, blindness came to the hospital for evaluation of increasing agitation and aggressive behavior.  Upon admission he was found to urinary tract infection which was susceptible to Rocephin therefore patient was treated with 5 days of antibiotics.  For several days now patient is pending placement  Assessment & Plan:  Urinary tract infection, resolved - Urine cultures has grown Klebsiella pneumonia.  This is susceptible to Rocephin - pt completed 5 days of Rocephin, discontinued 02/07  Intermittent behavioral disturbance - pt calm this am, no agitation, following commands  - use ativan or Haldol as needed  - d/c sitter at bedside  - pending placement  BPH stable - continue Flomax and Finasteride   Essential hypertension - continue Lisinopril  - reasonable inpatient control   DVT prophylaxis: Lovenox SQ Code Status: DNR Family Communication: pt at bedside  Disposition Plan: placement pending   Antimicrobials:   Status post 5 days Rocephin, last dose June 24, 2017  Subjective: No concerns this AM.   Objective: Vitals:   07/08/17 0558 07/08/17 1303 07/08/17 2017 07/09/17 0455  BP: 130/84 126/72 108/60 113/66  Pulse: (!) 53 (!) 57 65 (!) 57  Resp: 15 12 14 16   Temp: 98.3 F (36.8 C) 98.7 F (37.1 C) 98.2 F (36.8 C) 98.7 F (37.1 C)  TempSrc: Oral Oral Oral Oral  SpO2: 98% 97% 97% 96%  Weight:      Height:        Intake/Output Summary (Last 24 hours) at 07/09/2017 1158 Last data filed at 07/09/2017 1038 Gross per 24 hour  Intake 1400 ml  Output 1275 ml  Net 125 ml   Filed Weights   06/25/17 1321  Weight: 65.8 kg (145 lb)   Physical Exam  Constitutional: Appears calm, NAD CVS: RRR, S1/S2 +, no murmurs, no gallops, no  carotid bruit.  Pulmonary: Effort and breath sounds normal, no stridor, rhonchi, wheezes, rales.  Abdominal: Soft. BS +,  no distension, tenderness, rebound or guarding.  Neuro: Alert. Normal reflexes, muscle tone coordination.   Radiology Studies: No results found.   Scheduled Meds: . aspirin EC  81 mg Oral Daily  . cholecalciferol  1,000 Units Oral Daily  . citalopram  10 mg Oral Daily  . divalproex  250 mg Oral Daily  . divalproex  500 mg Oral QHS  . enoxaparin (LOVENOX) injection  40 mg Subcutaneous Q24H  . finasteride  5 mg Oral Daily  . haloperidol  2 mg Oral TID  . hydrALAZINE  5 mg Intravenous Once  . lisinopril  20 mg Oral Daily  . nicotine  14 mg Transdermal Daily  . tamsulosin  0.4 mg Oral QPC supper  . traZODone  50 mg Oral QHS   Continuous Infusions:   LOS: 14 days   Time spent:  15 minutes   Faye Ramsay, MD [facility] Pager [phone]  If 7PM-7AM, please contact night-coverage [email or web address] Password [facility] 07/09/2017, 11:58 AM

## 2017-07-10 NOTE — Progress Notes (Signed)
Physical Therapy Treatment Patient Details Name: Joe Dawson MRN: [id number] DOB: 1950/08/01 Today's Date: 07/10/2017    History of Present Illness 67 year old with a history of essential hypertension, BPH, degenerative disc disease, dementia with behavioral changes, blindness came to the hospital 06/25/17 for evaluation of increasing agitation and aggressive behavior.  Upon admission he was found to urinary tract infection    PT Comments    Assisted OOB to attempt to amb to bathroom.  Pt required + 2 assist.  Pt unable to stand upright and present with scissor gait.   Follow Up Recommendations  [facility])     Equipment Recommendations  Rolling walker with 5" wheels    Recommendations for Other Services       Precautions / Restrictions Precautions Precautions: Fall Precaution Comments: blindness, recent h/o aggressiveness Restrictions Weight Bearing Restrictions: No    Mobility  Bed Mobility Overal bed mobility: Needs Assistance Bed Mobility: Supine to Sit     Supine to sit: Mod assist;Max assist     General bed mobility comments: extra time, multimodal cues  for legs and trunk.  Transfers Overall transfer level: Needs assistance Equipment used: None;2 person hand held assist Transfers: Sit to/from [facility] Sit to Stand: Max assist;+2 physical assistance;+2 safety/equipment         General transfer comment: hand over hand cueing and MAX VC's for upright posture.  Initial "skiing" B feet.  Also MAX assist to control stand to sit.  assisted on/off toilet MAX Assist.    Ambulation/Gait Ambulation/Gait assistance: Max assist Ambulation Distance (Feet): 5 Feet Assistive device: 2 person hand held assist Gait Pattern/deviations: Step-to pattern;Step-through pattern;Shuffle;Ataxic;Scissoring Gait velocity: decreased   General Gait Details: poor flexed hips and knees with inability to stand upright.  Very unsteady gait.  HIGH FALL  RISK.   Stairs            Wheelchair Mobility    Modified Rankin (Stroke Patients Only)       Balance                                            Cognition Arousal/Alertness: Awake/alert Behavior During Therapy: WFL for tasks assessed/performed Overall Cognitive Status: History of cognitive impairments - at baseline                                 General Comments: AxO x 1 following all commands       Exercises      General Comments        Pertinent Vitals/Pain Pain Assessment: No/denies pain    Home Living                      Prior Function            PT Goals (current goals can now be found in the care plan section) Progress towards PT goals: Progressing toward goals    Frequency    Min 2X/week      PT Plan Current plan remains appropriate    Co-evaluation              AM-PAC PT "6 Clicks" Daily Activity  Outcome Measure  Difficulty turning over in bed (including adjusting bedclothes, sheets and blankets)?: Unable Difficulty moving from lying on back to sitting on the side  of the bed? : Unable Difficulty sitting down on and standing up from a chair with arms (e.g., wheelchair, bedside commode, etc,.)?: Unable Help needed moving to and from a bed to chair (including a wheelchair)?: Total Help needed walking in hospital room?: Total Help needed climbing 3-5 steps with a railing? : Total 6 Click Score: 6    End of Session   Activity Tolerance: Patient tolerated treatment well Patient left: in chair Nurse Communication: Mobility status PT Visit Diagnosis: Unsteadiness on feet (R26.81);History of falling (Z91.81)     Time: 1010-1035 PT Time Calculation (min) (ACUTE ONLY): 25 min  Charges:  $Gait Training: 8-22 mins $Therapeutic Activity: 8-22 mins                    G Codes:       Rica Koyanagi  PTA [facility]  Acute  Rehab Pager      [phone]

## 2017-07-10 NOTE — Care Management Important Message (Signed)
Important Message  Patient Details  Name: JACQUAN SAVAS MRN: [id number] Date of Birth: July 30, 1950   Medicare Important Message Given:  Yes    Kerin Salen 07/10/2017, 10:28 [place] Message  Patient Details  Name: COLTRANE TUGWELL MRN: [id number] Date of Birth: 09/13/1950   Medicare Important Message Given:  Yes    Kerin Salen 07/10/2017, 10:28 AM

## 2017-07-10 NOTE — Progress Notes (Signed)
  PROGRESS NOTE  Joe Dawson  [id number] DOB: [birth year] DOA: 06/25/2017   PCP: Gayland Curry, DO   Brief Narrative:  67 year old with a history of essential hypertension, BPH, degenerative disc disease, dementia with behavioral changes, blindness came to the hospital for evaluation of increasing agitation and aggressive behavior.  Upon admission he was found to urinary tract infection which was susceptible to Rocephin therefore patient was treated with 5 days of antibiotics.  For several days now patient is pending placement  Assessment & Plan:  Urinary tract infection, resolved - Urine cultures has grown Klebsiella pneumonia.  This is susceptible to Rocephin - pt completed 5 days of Rocephin, discontinued 02/07  Intermittent behavioral disturbance - overall stable, pleasant and calm on exam - no sitter at bedside - pt is stable for discharge when bed available   BPH stable - continue Flomax and Finasteride   Essential hypertension - continue Lisinopril  - reasonable inpatient control   DVT prophylaxis: Lovenox SQ Code Status: DNR Family Communication: pt at bedside  Disposition Plan: pt stable for d/c when bed available   Antimicrobials:   Status post 5 days Rocephin, last dose June 24, 2017  Subjective: No concerns this AM.   Objective: Vitals:   07/09/17 0455 07/09/17 1331 07/09/17 2100 07/10/17 0655  BP: 113/66 (!) 105/59 (!) 150/69 (!) 145/68  Pulse: (!) 57 60 65 70  Resp: 16 15 15 13   Temp: 98.7 F (37.1 C) 98.6 F (37 C) 98.8 F (37.1 C) 98.4 F (36.9 C)  TempSrc: Oral Oral Oral Oral  SpO2: 96% 97% 98% 99%  Weight:      Height:        Intake/Output Summary (Last 24 hours) at 07/10/2017 1007 Last data filed at 07/10/2017 0744 Gross per 24 hour  Intake 1020 ml  Output 2300 ml  Net -1280 ml   Filed Weights   06/25/17 1321  Weight: 65.8 kg (145 lb)   Physical Exam  Constitutional: Appears well-developed and well-nourished. No distress.    CVS: RRR, S1/S2 +, no murmurs, no gallops, no carotid bruit.  Pulmonary: Effort and breath sounds normal, no stridor, rhonchi, wheezes, rales.  Abdominal: Soft. BS +,  no distension, tenderness, rebound or guarding.  Musculoskeletal: Normal range of motion. No edema and no tenderness.   Radiology Studies: No results found.   Scheduled Meds: . aspirin EC  81 mg Oral Daily  . cholecalciferol  1,000 Units Oral Daily  . citalopram  10 mg Oral Daily  . divalproex  250 mg Oral Daily  . divalproex  500 mg Oral QHS  . enoxaparin (LOVENOX) injection  40 mg Subcutaneous Q24H  . finasteride  5 mg Oral Daily  . haloperidol  2 mg Oral TID  . hydrALAZINE  5 mg Intravenous Once  . lisinopril  20 mg Oral Daily  . nicotine  14 mg Transdermal Daily  . tamsulosin  0.4 mg Oral QPC supper  . traZODone  50 mg Oral QHS   Continuous Infusions:   LOS: 15 days   Time spent:  15 minutes   Faye Ramsay, MD [facility] Pager [phone]  If 7PM-7AM, please contact night-coverage [email or web address] Password [facility] 07/10/2017, 10:07 AM

## 2017-07-11 NOTE — Progress Notes (Signed)
CSW following for discharge planning.  -CSW contacted several SNF buildings providing updates about patient current status-No behavior/sitter -[facility] is reviewing patient clinicals  -CSW reached out to [employer] and [employer] at [employer] level II determination- an in person evaluation to be performed to assess placement and service needs through the Preadmission Screening Resident Review (PASRR)process   PASRR will contact CSW when representative is able to complete evaluation.   Kathrin Greathouse, Latanya Presser, MSW Clinical Social Worker  [phone] 07/11/2017  8:10 AM

## 2017-07-11 NOTE — Progress Notes (Signed)
  PROGRESS NOTE  Joe Dawson  [id number] DOB: May 26, 1950 DOA: 06/25/2017   PCP: Gayland Curry, DO   Brief Narrative:  67 year old with a history of essential hypertension, BPH, degenerative disc disease, dementia with behavioral changes, blindness came to the hospital for evaluation of increasing agitation and aggressive behavior.  Upon admission he was found to urinary tract infection which was susceptible to Rocephin therefore patient was treated with 5 days of antibiotics.  For several days now patient is pending placement  Assessment & Plan:  Urinary tract infection, resolved - Urine cultures has grown Klebsiella pneumonia.  This is susceptible to Rocephin - pt completed 5 days of Rocephin, discontinued 02/07  Intermittent behavioral disturbance - overall stable, calm and pleasant this AM - no sitter required - pt is stable for discharge when bed available   BPH stable - continue Flomax and Finasteride   Essential hypertension - continue Lisinopril  - reasonable inpatient control   DVT prophylaxis: Lovenox SQ Code Status: DNR Family Communication: pt and wife at bedside  Disposition Plan: pt stable for d/c when bed available   Antimicrobials:   Status post 5 days Rocephin, last dose June 24, 2017  Subjective: No concerns this AM.   Objective: Vitals:   07/10/17 1354 07/11/17 0437 07/11/17 0904 07/11/17 1348  BP: 117/81 (!) 149/82 (!) 150/128 136/75  Pulse: (!) 58 (!) 56 (!) 56 (!) 57  Resp: 15 15  16   Temp: (!) 97.5 F (36.4 C) 97.8 F (36.6 C)  98 F (36.7 C)  TempSrc: Oral Oral  Axillary  SpO2: 97% 96%  96%  Weight:      Height:        Intake/Output Summary (Last 24 hours) at 07/11/2017 1407 Last data filed at 07/11/2017 1348 Gross per 24 hour  Intake 1060 ml  Output 900 ml  Net 160 ml   Filed Weights   06/25/17 1321  Weight: 65.8 kg (145 lb)   Physical Exam  Constitutional: Appears calm, NAD, pt is blind  CVS: RRR, S1/S2 +, no murmurs, no  gallops, no carotid bruit.  Pulmonary: Effort and breath sounds normal, no stridor, rhonchi, wheezes, rales.  Abdominal: Soft. BS +,  no distension, tenderness, rebound or guarding.  Musculoskeletal: Normal range of motion. No edema and no tenderness.  Psychiatric: Normal mood and affect. Behavior, judgment, thought content normal.   Radiology Studies: No results found.   Scheduled Meds: . aspirin EC  81 mg Oral Daily  . cholecalciferol  1,000 Units Oral Daily  . citalopram  10 mg Oral Daily  . divalproex  250 mg Oral Daily  . divalproex  500 mg Oral QHS  . enoxaparin (LOVENOX) injection  40 mg Subcutaneous Q24H  . finasteride  5 mg Oral Daily  . haloperidol  2 mg Oral TID  . hydrALAZINE  5 mg Intravenous Once  . lisinopril  20 mg Oral Daily  . nicotine  14 mg Transdermal Daily  . tamsulosin  0.4 mg Oral QPC supper  . traZODone  50 mg Oral QHS   Continuous Infusions:   LOS: 16 days   Time spent:  15 minutes   Faye Ramsay, MD [facility] Pager [phone]  If 7PM-7AM, please contact night-coverage [email or web address] Password [facility] 07/11/2017, 2:07 PM

## 2017-07-12 MED ORDER — HALOPERIDOL LACTATE 2 MG/ML PO CONC
2.0000 mg | Freq: Three times a day (TID) | ORAL | Status: DC
  Administered 2017-07-12 – 2017-07-24 (×36): 2 mg via ORAL
  Filled 2017-07-12 (×40): qty 1

## 2017-07-12 NOTE — Plan of Care (Signed)
Care plan reviewed.

## 2017-07-12 NOTE — Progress Notes (Signed)
Physical Therapy Treatment Patient Details Name: MIECZYSLAW STAMAS MRN: [id number] DOB: 10/03/1950 Today's Date: 07/12/2017    History of Present Illness 67 year old with a history of essential hypertension, BPH, degenerative disc disease, dementia with behavioral changes, blindness came to the hospital 06/25/17 for evaluation of increasing agitation and aggressive behavior.  Upon admission he was found to urinary tract infection    PT Comments    Pt in with spouse at bed sides.  AxO x 1 following repeat functional VC's but present with delayed response.  Pt very RIGID throughout with B flex hips and knees as well as Kyphotic spine.  Assisted OOB to attempt amb required + 2 assist.  Pt was unable to stand erect and unable to take functional steps.  R LE buckled with each stance.  Tight ADduction.  Pt unable to correctly use walker so + 2 hand held assist attempted forward motion.  Pt was unable to stand erect enough and unable to functionally weight shift or advance either LE.  Recliner brought to pt from behind and assisted to control descend to chair.   Pt will need [facility] at SNF to address mobility prior to returning home.   Follow Up Recommendations  SNF     Equipment Recommendations       Recommendations for Other Services       Precautions / Restrictions Precautions Precautions: Fall Precaution Comments: blind Restrictions Weight Bearing Restrictions: No Other Position/Activity Restrictions: WBAT     Mobility  Bed Mobility Overal bed mobility: Needs Assistance Bed Mobility: Supine to Sit     Supine to sit: Max assist;Total assist     General bed mobility comments: extra time, multimodal cues  for legs and trunk.   Used bed pad to complete scooting to EOB  Transfers Overall transfer level: Needs assistance Equipment used: Rolling walker (2 wheeled);2 person hand held assist Transfers: Sit to/from Stand Sit to Stand: Max assist;Total assist;+2 physical assistance;+2  safety/equipment         General transfer comment: hand over hand cueing and MAX VC's for upright posture.  Initial "skiing" B feet.  Also MAX assist to control stand to sit.  Very rigid throughout   Ambulation/Gait Ambulation/Gait assistance: +2 physical assistance;+2 safety/equipment;Max assist;Total assist Ambulation Distance (Feet): 2 Feet Assistive device: 2 person hand held assist;Rolling walker (2 wheeled) Gait Pattern/deviations: Step-to pattern;Decreased stance time - right Gait velocity: decreased   General Gait Details: poor flexed hips and knees with inability to stand upright.  Poor initiation.  Unable to functionally step forward.  Unable to tolerate WBing R LE.  Very unsteady gait.  HIGH FALL RISK.   Stairs            Wheelchair Mobility    Modified Rankin (Stroke Patients Only)       Balance                                            Cognition Arousal/Alertness: Awake/alert Behavior During Therapy: WFL for tasks assessed/performed                                   General Comments: AxO x 1 following all commands   slow to process and delayed reaction/responce       Exercises      General Comments  Pertinent Vitals/Pain Pain Assessment: Faces Faces Pain Scale: Hurts little more Pain Location: R ankle, R knee and L hip Pain Descriptors / Indicators: Grimacing Pain Intervention(s): Monitored during session    Home Living                      Prior Function            PT Goals (current goals can now be found in the care plan section) Progress towards PT goals: Progressing toward goals    Frequency    Min 2X/week      PT Plan Current plan remains appropriate    Co-evaluation              AM-PAC PT "6 Clicks" Daily Activity  Outcome Measure  Difficulty turning over in bed (including adjusting bedclothes, sheets and blankets)?: Unable Difficulty moving from lying on back to  sitting on the side of the bed? : Unable Difficulty sitting down on and standing up from a chair with arms (e.g., wheelchair, bedside commode, etc,.)?: Unable Help needed moving to and from a bed to chair (including a wheelchair)?: Total Help needed walking in hospital room?: Total Help needed climbing 3-5 steps with a railing? : Total 6 Click Score: 6    End of Session Equipment Utilized During Treatment: Gait belt Activity Tolerance: Patient tolerated treatment well Patient left: in chair;with family/visitor present;with chair alarm set Nurse Communication: Mobility status PT Visit Diagnosis: Unsteadiness on feet (R26.81);History of falling (Z91.81)     Time: 5597-4163 PT Time Calculation (min) (ACUTE ONLY): 16 min  Charges:  $Gait Training: 8-22 mins                    G Codes:       Rica Koyanagi  PTA [facility]  Acute  Rehab Pager      [phone]

## 2017-07-12 NOTE — Plan of Care (Signed)
Resting calmly.

## 2017-07-12 NOTE — Progress Notes (Signed)
  PROGRESS NOTE  Joe Dawson  BOF:[id number] DOB: [birth year] DOA: 06/25/2017   PCP: Gayland Curry, DO   Brief Narrative:  67 year old with a history of essential hypertension, BPH, degenerative disc disease, dementia with behavioral changes, blindness came to the hospital for evaluation of increasing agitation and aggressive behavior.  Upon admission he was found to urinary tract infection which was susceptible to Rocephin therefore patient was treated with 5 days of antibiotics.     Assessment & Plan:  Urinary tract infection, resolved - Urine cultures has grown Klebsiella pneumonia.  This is susceptible to Rocephin - pt completed 5 days of Rocephin, discontinued 06/29/17  Intermittent behavioral disturbance - overall stable, calm and cooperative- no sitter required - pt is stable for discharge when SNF bed available   BPH stable - continue Flomax and Finasteride   HTN- stable,  c/n  Lisinopril    DVT prophylaxis: Lovenox SQ Code Status: DNR Family Communication: pt and wife at bedside  Disposition Plan: pt stable for d/c to SNF  when bed available   Antimicrobials:  Status post 5 days Rocephin,    Subjective: Resting comfortably, wife at bedside helping him eat,  Objective: Vitals:   07/11/17 1348 07/11/17 2228 07/12/17 0536 07/12/17 1323  BP: 136/75 (!) 163/78 112/63 109/76  Pulse: (!) 57 66 (!) 56 62  Resp: 16 16 16 16   Temp: 98 F (36.7 C) 98.1 F (36.7 C) 97.9 F (36.6 C) 98.5 F (36.9 C)  TempSrc: Axillary Oral Oral Oral  SpO2: 96% 96% 98% 98%  Weight:      Height:        Intake/Output Summary (Last 24 hours) at 07/12/2017 1747 Last data filed at 07/12/2017 1324 Gross per 24 hour  Intake 440 ml  Output 950 ml  Net -510 ml   Filed Weights   06/25/17 1321  Weight: 65.8 kg (145 lb)   Physical Exam  Constitutional: Appears calm, NAD, pt is blind , patient is cooperative CVS: RRR, S1/S2 +, no murmurs,   Pulmonary: Effort and breath sounds normal,     Abdominal: Soft. BS +,  no distension,NT  Musculoskeletal: Normal range of motion. No edema and no tenderness.  Psychiatric: Affect is appropriate, patient is cooperative  Radiology Studies: No results found.   Scheduled Meds: . aspirin EC  81 mg Oral Daily  . cholecalciferol  1,000 Units Oral Daily  . citalopram  10 mg Oral Daily  . divalproex  250 mg Oral Daily  . divalproex  500 mg Oral QHS  . enoxaparin (LOVENOX) injection  40 mg Subcutaneous Q24H  . finasteride  5 mg Oral Daily  . haloperidol  2 mg Oral TID  . hydrALAZINE  5 mg Intravenous Once  . lisinopril  20 mg Oral Daily  . nicotine  14 mg Transdermal Daily  . tamsulosin  0.4 mg Oral QPC supper  . traZODone  50 mg Oral QHS    LOS: 17 days   Roxan Hockey, MD [facility] Pager [phone]  If 7PM-7AM, please contact night-coverage [email or web address] Password [facility] 07/12/2017, 5:47 PM

## 2017-07-13 MED ORDER — BISACODYL 10 MG RE SUPP
10.0000 mg | Freq: Once | RECTAL | Status: AC
  Administered 2017-07-13: 10 mg via RECTAL
  Filled 2017-07-13: qty 1

## 2017-07-13 MED ORDER — POLYETHYLENE GLYCOL 3350 17 G PO PACK
17.0000 g | PACK | Freq: Every day | ORAL | Status: DC
  Administered 2017-07-13 – 2017-07-23 (×11): 17 g via ORAL
  Filled 2017-07-13 (×11): qty 1

## 2017-07-13 NOTE — Progress Notes (Signed)
  PROGRESS NOTE  Joe Dawson  WHQ:[id number] DOB: 1950/07/11 DOA: 06/25/2017   PCP: Gayland Curry, DO   Brief Narrative:  67 year old with a history of essential hypertension, BPH, degenerative disc disease, dementia with behavioral changes, blindness came to the hospital for evaluation of increasing agitation and aggressive behavior.  Upon admission he was found to urinary tract infection which was susceptible to Rocephin therefore patient was treated with 5 days of antibiotics.     Assessment & Plan:  1)Dementia-dementia is primary diagnosis, diagnosis of dementia supersedes all other diagnoses,  2)Constipation-laxatives as ordered  Urinary tract infection, resolved - Urine cultures has grown Klebsiella pneumonia.  This is susceptible to Rocephin - pt completed 5 days of Rocephin, discontinued 06/29/17  Intermittent behavioral disturbance - overall stable, calm and cooperative- no sitter required - pt is stable for discharge when SNF bed available   BPH stable - continue Flomax and Finasteride   HTN- stable,  c/n  Lisinopril     Patient is awaiting placement  DVT prophylaxis: Lovenox SQ Code Status: DNR Family Communication: pt and wife at bedside  Disposition Plan: pt stable for d/c to SNF  when bed available   Antimicrobials:  Status post 5 days Rocephin,    Subjective: Resting comfortably, complains of constipation  Objective: Vitals:   07/12/17 1323 07/12/17 2057 07/13/17 0635 07/13/17 1407  BP: 109/76 (!) 143/85 131/70 108/65  Pulse: 62 (!) 58 (!) 55 (!) 57  Resp: 16 16 16 16   Temp: 98.5 F (36.9 C) 98.6 F (37 C) 97.7 F (36.5 C) 98.7 F (37.1 C)  TempSrc: Oral Oral Oral Oral  SpO2: 98% 96% 96% 95%  Weight:      Height:        Intake/Output Summary (Last 24 hours) at 07/13/2017 1841 Last data filed at 07/13/2017 1407 Gross per 24 hour  Intake 920 ml  Output 1750 ml  Net -830 ml   Filed Weights   06/25/17 1321  Weight: 65.8 kg (145 lb)    Physical Exam  Constitutional: Appears calm, NAD, pt is blind , patient is cooperative HEENT- Blind CVS: RRR, S1/S2 +, no murmurs,   Pulmonary: Effort and breath sounds normal,   Abdominal: Soft. BS +,  no distension,NT  Musculoskeletal: Normal range of motion. No edema and no tenderness.  Psychiatric: Affect is appropriate, patient is cooperative  Radiology Studies: No results found.   Scheduled Meds: . aspirin EC  81 mg Oral Daily  . cholecalciferol  1,000 Units Oral Daily  . citalopram  10 mg Oral Daily  . divalproex  250 mg Oral Daily  . divalproex  500 mg Oral QHS  . enoxaparin (LOVENOX) injection  40 mg Subcutaneous Q24H  . finasteride  5 mg Oral Daily  . haloperidol  2 mg Oral TID  . hydrALAZINE  5 mg Intravenous Once  . lisinopril  20 mg Oral Daily  . nicotine  14 mg Transdermal Daily  . polyethylene glycol  17 g Oral Daily  . tamsulosin  0.4 mg Oral QPC supper  . traZODone  50 mg Oral QHS    LOS: 18 days   Roxan Hockey, MD [facility] Pager [phone]  If 7PM-7AM, please contact night-coverage [email or web address] Password [facility] 07/13/2017, 6:41 PM

## 2017-07-13 NOTE — Consult Note (Signed)
   [facility] CM Inpatient Consult   07/13/2017  Joe Dawson April 06, 1951 [id number]   [facility] Care Management follow up.   Chart reviewed. Mr. Rostad awaiting SNF placement. Spoke with inpatient RNCM to discuss.   Marthenia Rolling, MSN-Ed, RN,BSN [facility] Liaison [phone]

## 2017-07-14 LAB — CBC
HCT: 38.8 % — ABNORMAL LOW (ref 39.0–52.0)
Hemoglobin: 13.1 g/dL (ref 13.0–17.0)
MCH: 33.3 pg (ref 26.0–34.0)
MCHC: 33.8 g/dL (ref 30.0–36.0)
MCV: 98.7 fL (ref 78.0–100.0)
PLATELETS: 201 10*3/uL (ref 150–400)
RBC: 3.93 MIL/uL — AB (ref 4.22–5.81)
RDW: 13.7 % (ref 11.5–15.5)
WBC: 7.3 10*3/uL (ref 4.0–10.5)

## 2017-07-14 LAB — BASIC METABOLIC PANEL
ANION GAP: 9 (ref 5–15)
BUN: 15 mg/dL (ref 6–20)
CALCIUM: 8.8 mg/dL — AB (ref 8.9–10.3)
CO2: 24 mmol/L (ref 22–32)
Chloride: 103 mmol/L (ref 101–111)
Creatinine, Ser: 0.56 mg/dL — ABNORMAL LOW (ref 0.61–1.24)
GLUCOSE: 90 mg/dL (ref 65–99)
POTASSIUM: 3.8 mmol/L (ref 3.5–5.1)
SODIUM: 136 mmol/L (ref 135–145)

## 2017-07-14 NOTE — Progress Notes (Signed)
CSW following to assist with SNF placement for [facility].   Patient's PASRR went to level II.   CSW contacted [facility] and spoke with staff member [place]. Staff reported that patient's information will be reviewed again and if the patient needs a level II PASRR, CSW will receive a call within 3-5 days for [facility] staff to schedule a face to face appointment with patient.   CSW will continue to follow and assist with discharge planning.  Abundio Miu, [place] Social Worker [facility] Cell#: [phone]

## 2017-07-14 NOTE — Progress Notes (Signed)
Physical Therapy Treatment Patient Details Name: Joe Dawson MRN: [id number] DOB: [birth year] Today's Date: 07/14/2017    History of Present Illness 67 year old with a history of essential hypertension, BPH, degenerative disc disease, dementia with behavioral changes, blindness came to the hospital 06/25/17 for evaluation of increasing agitation and aggressive behavior.  Upon admission he was found to urinary tract infection    PT Comments    Assisted OOB to [facility] to void then amb in hallway + 2 side by side assist with 3rd following with recliner.  See mobility details below. Pt will need ST Rehab at SNF  Follow Up Recommendations  SNF     Equipment Recommendations       Recommendations for Other Services       Precautions / Restrictions Precautions Precautions: Fall Precaution Comments: blind Restrictions Weight Bearing Restrictions: No Other Position/Activity Restrictions: WBAT     Mobility  Bed Mobility Overal bed mobility: Needs Assistance Bed Mobility: Supine to Sit     Supine to sit: Max assist;Total assist Sit to supine: Mod assist   General bed mobility comments: extra time, multimodal cues  for legs and trunk.   Used bed pad to complete scooting to EOB  Transfers Overall transfer level: Needs assistance Equipment used: Rolling walker (2 wheeled);2 person hand held assist Transfers: Sit to/from Stand Sit to Stand: Max assist;Total assist;+2 physical assistance;+2 safety/equipment Stand pivot transfers: +2 physical assistance;+2 safety/equipment;Max assist       General transfer comment: hand over hand cueing and MAX VC's for upright posture.  Initial "skiing" B feet.  Also MAX assist to control stand to sit.  Very rigid throughout   Ambulation/Gait Ambulation/Gait assistance: +2 physical assistance;+2 safety/equipment;Max assist;Total assist Ambulation Distance (Feet): 10 Feet Assistive device: Rolling walker (2 wheeled) Gait Pattern/deviations: Step-to  pattern;Decreased step length - right;Decreased step length - left;Drifts right/left;Shuffle;Scissoring Gait velocity: decreased   General Gait Details: tolerated an increased distance with + 2 side by assist for support and advancement of walker due to low vision.  Very small, tight, shuffled steps with poor initiation and B knees buckling R>L.  3rd assist following with recliner.    Stairs            Wheelchair Mobility    Modified Rankin (Stroke Patients Only)       Balance                                            Cognition Arousal/Alertness: Awake/alert Behavior During Therapy: WFL for tasks assessed/performed Overall Cognitive Status: History of cognitive impairments - at baseline                                 General Comments: AxO x 1 following all commands   slow to process and delayed reaction/responce       Exercises      General Comments        Pertinent Vitals/Pain Pain Assessment: Faces Faces Pain Scale: Hurts little more Pain Location: L shoulder "I probably laid on it" Pain Descriptors / Indicators: Grimacing Pain Intervention(s): Monitored during session    Home Living                      Prior Function            PT  Goals (current goals can now be found in the care plan section) Progress towards PT goals: Progressing toward goals    Frequency    Min 2X/week      PT Plan Current plan remains appropriate    Co-evaluation              AM-PAC PT "6 Clicks" Daily Activity  Outcome Measure  Difficulty turning over in bed (including adjusting bedclothes, sheets and blankets)?: Unable Difficulty moving from lying on back to sitting on the side of the bed? : Unable Difficulty sitting down on and standing up from a chair with arms (e.g., wheelchair, bedside commode, etc,.)?: Unable Help needed moving to and from a bed to chair (including a wheelchair)?: Total Help needed walking in  hospital room?: Total Help needed climbing 3-5 steps with a railing? : Total 6 Click Score: 6    End of Session Equipment Utilized During Treatment: Gait belt Activity Tolerance: Patient tolerated treatment well Patient left: in chair;with family/visitor present;with chair alarm set Nurse Communication: Mobility status PT Visit Diagnosis: Unsteadiness on feet (R26.81);History of falling (Z91.81)     Time: 1425-1450 PT Time Calculation (min) (ACUTE ONLY): 25 min  Charges:  $Gait Training: 8-22 mins $Therapeutic Activity: 8-22 mins                    G Codes:       Rica Koyanagi  PTA [facility]  Acute  Rehab Pager      [phone]

## 2017-07-14 NOTE — Progress Notes (Signed)
  PROGRESS NOTE  Joe Dawson  [id number]:[id number] DOB: [birth year] DOA: 06/25/2017   PCP: Gayland Curry, DO   Brief Narrative:  67 year old with a history of essential hypertension, BPH, degenerative disc disease, dementia with behavioral changes, blindness came to the hospital for evaluation of increasing agitation and aggressive behavior.  Upon admission he was found to urinary tract infection which was susceptible to Rocephin therefore patient was treated with 5 days of antibiotics.  Now awaiting SNF  Assessment & Plan:  1)Dementia-dementia is primary diagnosis, diagnosis of dementia supersedes all other diagnoses, stable, cooperative, overall  calm and  no sitter required  2)Constipation- improved with laxatives, continue MiraLAX daily   3)Urinary tract infection, resolved- - Urine cultures has grown Klebsiella pneumonia.  This is susceptible to Rocephin, - pt completed 5 days of Rocephin, discontinued 06/29/17 4)BPH stable-  continue Flomax and Finasteride   5)HTN- stable,  c/n  Lisinopril    Patient is awaiting SNF placement  DVT prophylaxis: Lovenox SQ Code Status: DNR Family Communication: pt and wife at bedside  Disposition Plan: pt stable for d/c to SNF when bed available   Antimicrobials:  Status post 5 days Rocephin,    Subjective: Resting comfortably, wife at bedside,   Objective: Vitals:   07/13/17 0635 07/13/17 1407 07/13/17 2025 07/14/17 0600  BP: 131/70 108/65 140/82 (!) 145/80  Pulse: (!) 55 (!) 57 60 (!) 59  Resp: 16 16 15 14   Temp: 97.7 F (36.5 C) 98.7 F (37.1 C) 98.8 F (37.1 C) 98.2 F (36.8 C)  TempSrc: Oral Oral Oral Oral  SpO2: 96% 95% 94% 98%  Weight:      Height:        Intake/Output Summary (Last 24 hours) at 07/14/2017 1217 Last data filed at 07/14/2017 1011 Gross per 24 hour  Intake 540 ml  Output 1350 ml  Net -810 ml   Filed Weights   06/25/17 1321  Weight: 65.8 kg (145 lb)   Physical Exam  Constitutional: Appears calm, NAD, pt is  blind , patient is cooperative HEENT- Blind CVS: RRR, S1/S2 +, no murmurs,   Pulmonary: CTA bil Abdominal: Soft. BS +,  no distension,NT  Musculoskeletal: Normal range of motion. No edema and no tenderness.  Psychiatric: Affect is appropriate, patient is cooperative  Radiology Studies: No results found.   Scheduled Meds: . aspirin EC  81 mg Oral Daily  . cholecalciferol  1,000 Units Oral Daily  . citalopram  10 mg Oral Daily  . divalproex  250 mg Oral Daily  . divalproex  500 mg Oral QHS  . enoxaparin (LOVENOX) injection  40 mg Subcutaneous Q24H  . finasteride  5 mg Oral Daily  . haloperidol  2 mg Oral TID  . hydrALAZINE  5 mg Intravenous Once  . lisinopril  20 mg Oral Daily  . nicotine  14 mg Transdermal Daily  . polyethylene glycol  17 g Oral Daily  . tamsulosin  0.4 mg Oral QPC supper  . traZODone  50 mg Oral QHS    LOS: 19 days   Roxan Hockey, MD [facility] Pager [phone]  If 7PM-7AM, please contact night-coverage [email or web address] Password [facility] 07/14/2017, 12:17 PM

## 2017-07-14 NOTE — Progress Notes (Signed)
CSW followed up with [place] [facility] regarding patient's referral. CSW spoke with staff member Kathlee Nations, staff reported that patient's referral is still under review by other staff members at the SNF. Staff agreed to contact CSW once there is an update.   Barriers to discharge Patient has no bed offers Patient PASRR pending, under level II review  Joe Dawson, [place] Worker [facility] Cell#: [phone]

## 2017-07-15 NOTE — Progress Notes (Signed)
  PROGRESS NOTE  BARTLETT ENKE  GQB:[id number] DOB: 1951/04/17 DOA: 06/25/2017   PCP: Gayland Curry, DO   Brief Narrative:  67 year old with a history of essential hypertension, BPH, degenerative disc disease, dementia with behavioral changes, blindness came to the hospital for evaluation of increasing agitation and aggressive behavior.  Upon admission he was found to urinary tract infection which was susceptible to Rocephin therefore patient was treated with 5 days of antibiotics.  Patient is medically stable for discharge to skilled nursing facility when bed is available   Assessment & Plan:  1)Dementia-dementia is primary diagnosis, diagnosis of dementia supersedes all other diagnoses, stable, cooperative, overall  calm and  no sitter required  2)Constipation- improved with laxatives, continue MiraLAX daily   3)Urinary tract infection, resolved- - Urine cultures has grown Klebsiella pneumonia.  This is susceptible to Rocephin, - pt completed 5 days of Rocephin, discontinued 06/29/17 4)BPH stable-  continue Flomax and Finasteride   5)HTN- stable,  c/n  Lisinopril    Patient is awaiting SNF placement  DVT prophylaxis: Lovenox SQ Code Status: DNR Family Communication: pt and wife at bedside  Disposition Plan: pt stable for d/c to SNF when bed available   Antimicrobials:  Status post 5 days Rocephin,    Subjective: Resting comfortably, had BM, brother and wife at bedside, questions answered Objective: Vitals:   07/14/17 2236 07/15/17 0652 07/15/17 0944 07/15/17 1327  BP: (!) 157/91 140/86 (!) 100/55 119/65  Pulse: 64 (!) 52  63  Resp: 13 13  13   Temp: 98.3 F (36.8 C) 97.9 F (36.6 C)  99.1 F (37.3 C)  TempSrc: Oral Oral  Oral  SpO2: 98% 96%  97%  Weight:      Height:        Intake/Output Summary (Last 24 hours) at 07/15/2017 1707 Last data filed at 07/15/2017 8280 Gross per 24 hour  Intake 380 ml  Output 960 ml  Net -580 ml   Filed Weights   06/25/17 1321    Weight: 65.8 kg (145 lb)   Physical Exam  Constitutional: Appears calm, NAD, pt is blind , patient is cooperative HEENT- Blind CVS: RRR, S1/S2 +, no murmurs,   Pulmonary: CTA bil Abdominal: Soft. BS +,  no distension,NT  Musculoskeletal: Normal range of motion. No edema and no tenderness.  Psychiatric: Affect is appropriate, patient is cooperative  Radiology Studies: No results found.   Scheduled Meds: . aspirin EC  81 mg Oral Daily  . cholecalciferol  1,000 Units Oral Daily  . citalopram  10 mg Oral Daily  . divalproex  250 mg Oral Daily  . divalproex  500 mg Oral QHS  . enoxaparin (LOVENOX) injection  40 mg Subcutaneous Q24H  . finasteride  5 mg Oral Daily  . haloperidol  2 mg Oral TID  . hydrALAZINE  5 mg Intravenous Once  . lisinopril  20 mg Oral Daily  . nicotine  14 mg Transdermal Daily  . polyethylene glycol  17 g Oral Daily  . tamsulosin  0.4 mg Oral QPC supper  . traZODone  50 mg Oral QHS    LOS: 20 days   Roxan Hockey, MD [facility] Pager [phone]  If 7PM-7AM, please contact night-coverage [email or web address] Password [facility] 07/15/2017, 5:07 PM

## 2017-07-16 NOTE — Progress Notes (Signed)
  PROGRESS NOTE  LOTT SEELBACH  [id number]:[id number] DOB: 03-Oct-1950 DOA: 06/25/2017   PCP: Gayland Curry, DO   Brief Narrative:  67 year old with a history of essential hypertension, BPH, degenerative disc disease, dementia with behavioral changes, blindness came to the hospital for evaluation of increasing agitation and aggressive behavior.  Upon admission he was found to urinary tract infection which was susceptible to Rocephin therefore patient was treated with 5 days of antibiotics.  Patient is medically stable for discharge to skilled nursing facility when bed is available   Assessment & Plan:  1)Dementia-dementia is primary diagnosis, diagnosis of dementia supersedes all other diagnoses, stable, cooperative, overall  calm and  no sitter required, no agitation or other significant behavioral problems at this time  2)Constipation- improved with laxatives, continue MiraLAX daily   3)Urinary tract infection, resolved- - Urine cultures has grown Klebsiella pneumonia.  This is susceptible to Rocephin, - pt completed 5 days of Rocephin, discontinued 06/29/17  4)BPH stable-stable, continue Flomax and Finasteride   5)HTN- stable,  c/n  Lisinopril    Patient is awaiting SNF placement/Patient is medically stable for discharge to skilled nursing facility when bed is available   DVT prophylaxis: Lovenox SQ Code Status: DNR Family Communication: pt and wife at bedside  Disposition Plan: pt stable for d/c to SNF when bed available   Antimicrobials:  Status post 5 days Rocephin,    Subjective: Resting comfortably,  wife at bedside, no new concerns  objective: Vitals:   07/15/17 2022 07/16/17 0555 07/16/17 1013 07/16/17 1452  BP: (!) 146/81 140/69 116/67 118/70  Pulse: 68 72 67 65  Resp: 15 18  18   Temp: 98.9 F (37.2 C) 98.4 F (36.9 C)  98.4 F (36.9 C)  TempSrc: Oral Oral  Oral  SpO2: 98% 95%  95%  Weight:      Height:        Intake/Output Summary (Last 24 hours) at 07/16/2017  1922 Last data filed at 07/16/2017 1834 Gross per 24 hour  Intake 1500 ml  Output 2000 ml  Net -500 ml   Filed Weights   06/25/17 1321  Weight: 65.8 kg (145 lb)   Physical Exam  Constitutional: Appears calm, NAD,  HEENT- Blind CVS: RRR, S1/S2 +, no murmurs,   Pulmonary: Clear to auscultation bilaterally  abdominal: Soft. BS +,  no distension,NT  Musculoskeletal: Normal range of motion. No edema and no tenderness.  Psychiatric: Affect is appropriate, patient is cooperative  Radiology Studies: No results found.   Scheduled Meds: . aspirin EC  81 mg Oral Daily  . cholecalciferol  1,000 Units Oral Daily  . citalopram  10 mg Oral Daily  . divalproex  250 mg Oral Daily  . divalproex  500 mg Oral QHS  . enoxaparin (LOVENOX) injection  40 mg Subcutaneous Q24H  . finasteride  5 mg Oral Daily  . haloperidol  2 mg Oral TID  . hydrALAZINE  5 mg Intravenous Once  . lisinopril  20 mg Oral Daily  . nicotine  14 mg Transdermal Daily  . polyethylene glycol  17 g Oral Daily  . tamsulosin  0.4 mg Oral QPC supper  . traZODone  50 mg Oral QHS    LOS: 21 days    Patient is awaiting SNF placement/Patient is medically stable for discharge to skilled nursing facility when bed is available   Roxan Hockey, MD [facility] Pager [phone]  If 7PM-7AM, please contact night-coverage [email or web address] Password [facility] 07/16/2017, 7:22 PM

## 2017-07-17 ENCOUNTER — Telehealth: Payer: Self-pay | Admitting: Internal Medicine

## 2017-07-17 NOTE — Telephone Encounter (Signed)
I spoke with the patient's wife.  She stated that the pt has been in the hospital since early January.  They are searching for a memory facility, so she stated that he won't be making any appts any time soon. VDM (DD)

## 2017-07-17 NOTE — Progress Notes (Signed)
[facility]  Patient chart has been reviewed, patient has been seen and case was discussed with treatment team.  Patient is a 67 year old male with history of hypertension, severe dementia with behavioral changes, blindness and BPH who presented to the emergency department with increasing agitation and aggressive behavior.  Upon initial evaluation was found to have a UTI and he was treated with Rocephin for 5 days.  Psych evaluated patient and adjusted medications, felt that psychosis was due to UTI.  Was recommended for patient to be discharged to SNF with memory care and patient has been awaiting for placement.  Patient is medically cleared for discharge and no recent events have occurred.   Continue current treatment plan, awaiting full bed placement SNF, PASSR number obtained.   Chipper Oman, MD

## 2017-07-17 NOTE — Progress Notes (Signed)
Patient PASRR received today, [id number].  [place] SNF representative will visit patient tomorrow to evaluate him for potential placement.   Kathrin Greathouse, Latanya Presser, MSW Clinical Social Worker  [phone] 07/17/2017  2:12 PM

## 2017-07-17 NOTE — Care Management Important Message (Signed)
Important Message  Patient Details  Name: DEEP BONAWITZ MRN: [id number] Date of Birth: 1951/03/10   Medicare Important Message Given:  Yes    Kerin Salen 07/17/2017, 10:53 AMImportant Message  Patient Details  Name: MARY HOCKEY MRN: [id number] Date of Birth: January 27, 1951   Medicare Important Message Given:  Yes    Kerin Salen 07/17/2017, 10:53 AM

## 2017-07-17 NOTE — Progress Notes (Signed)
Physical Therapy Treatment Patient Details Name: Joe Dawson MRN: [id number] DOB: 13-Aug-1950 Today's Date: 07/17/2017    History of Present Illness 67 year old with a history of essential hypertension, BPH, degenerative disc disease, dementia with behavioral changes, blindness came to the hospital 06/25/17 for evaluation of increasing agitation and aggressive behavior.  Upon admission he was found to urinary tract infection    PT Comments    Pt with extended length of stay and progressing slowly with his mobility.  Still required + 2 assist for transfers and amb.  Poor standing balance and difficulty initiating mvts.   Follow Up Recommendations  SNF     Equipment Recommendations  Rolling walker with 5" wheels    Recommendations for Other Services       Precautions / Restrictions Precautions Precautions: Fall Precaution Comments: blind Restrictions Weight Bearing Restrictions: No Other Position/Activity Restrictions: WBAT     Mobility  Bed Mobility Overal bed mobility: Needs Assistance Bed Mobility: Supine to Sit     Supine to sit: Max assist;Total assist     General bed mobility comments: extra time, multimodal cues  for legs and trunk.   Used bed pad to complete scooting to EOB  Transfers Overall transfer level: Needs assistance Equipment used: Rolling walker (2 wheeled);2 person hand held assist Transfers: Sit to/from Stand Sit to Stand: Max assist;Total assist;+2 physical assistance;+2 safety/equipment Stand pivot transfers: +2 physical assistance;+2 safety/equipment;Max assist       General transfer comment: hand over hand cueing and MAX VC's for upright posture.  Initial "skiing" B feet.  Also MAX assist to control stand to sit.  Very rigid throughout   Ambulation/Gait Ambulation/Gait assistance: +2 physical assistance;+2 safety/equipment;Max assist;Total assist Ambulation Distance (Feet): 2 Feet Assistive device: Rolling walker (2 wheeled) Gait  Pattern/deviations: Step-to pattern;Decreased step length - right;Decreased step length - left;Drifts right/left;Shuffle;Scissoring Gait velocity: decreased   General Gait Details: decreased amb distance due to R LE buckle each stance attempt.  Severe posterior lean and inability to self correct upright midline posture.  Recliner brought top pt from behind.    Stairs            Wheelchair Mobility    Modified Rankin (Stroke Patients Only)       Balance                                            Cognition Arousal/Alertness: Awake/alert Behavior During Therapy: WFL for tasks assessed/performed Overall Cognitive Status: Within Functional Limits for tasks assessed                                        Exercises      General Comments        Pertinent Vitals/Pain Pain Assessment: Faces Faces Pain Scale: Hurts a little bit Pain Location: R hip with amb Pain Descriptors / Indicators: Grimacing Pain Intervention(s): Monitored during session    Home Living                      Prior Function            PT Goals (current goals can now be found in the care plan section) Progress towards PT goals: Progressing toward goals    Frequency    Min 2X/week  PT Plan Current plan remains appropriate    Co-evaluation              AM-PAC PT "6 Clicks" Daily Activity  Outcome Measure  Difficulty turning over in bed (including adjusting bedclothes, sheets and blankets)?: Unable Difficulty moving from lying on back to sitting on the side of the bed? : Unable Difficulty sitting down on and standing up from a chair with arms (e.g., wheelchair, bedside commode, etc,.)?: Unable Help needed moving to and from a bed to chair (including a wheelchair)?: Total Help needed walking in hospital room?: Total Help needed climbing 3-5 steps with a railing? : Total 6 Click Score: 6    End of Session Equipment Utilized During  Treatment: Gait belt Activity Tolerance: Patient tolerated treatment well Patient left: in chair;with family/visitor present;with chair alarm set Nurse Communication: Mobility status PT Visit Diagnosis: Unsteadiness on feet (R26.81);History of falling (Z91.81)     Time: 0762-2633 PT Time Calculation (min) (ACUTE ONLY): 14 min  Charges:  $Gait Training: 8-22 mins                    G Codes:       Rica Koyanagi  PTA [facility]  Acute  Rehab Pager      [phone]

## 2017-07-18 NOTE — Progress Notes (Signed)
[facility]   Patient continues to await for placement. Case discussed with Education officer, museum. Different facilities looking at the case. Will let us know who will accept the patient which will be difficult due to psych hx.   Patient is a 67 year old male with history of hypertension, severe dementia with behavioral changes, blindness and BPH who presented to the emergency department on 06/25/17 with increasing agitation and aggressive behavior.  Upon initial evaluation was found to have a UTI and he was treated with Rocephin for 5 days.  Psych evaluated patient and adjusted medications, felt that psychosis was due to UTI. Was recommended for patient to be discharged to SNF with memory care and patient has been awaiting for placement. Patient is medically and psychiatrically cleared for discharge.   Chipper Oman, MD

## 2017-07-19 NOTE — Progress Notes (Signed)
CSW spoke to Admission at [facility]. She reports the facility will not be able to take patient due history of aggressive behaviors.  CSW spoke with hospital leadership about patient discharge plan. Leadership provided CSW with additional facilities to inquire about for potential placement.   Kathrin Greathouse, Latanya Presser, MSW Clinical Social Worker  [phone] 07/19/2017  8:11 AM

## 2017-07-19 NOTE — Progress Notes (Signed)
PROGRESS NOTE    Joe Dawson  GBT:[id number] DOB: [birth year] DOA: 06/25/2017 PCP: Gayland Curry, DO    Brief Narrative:  Patient is a 67 year old male with history of hypertension, severe dementia with behavioral changes, blindness and BPH who presented to the emergency department on 06/25/17 with increasing agitation and aggressive behavior. Upon initial evaluation was found to have a UTI and he was treated with Rocephin for 5 days. Psych evaluated patient and adjusted medications, felt that psychosis was due to UTI. Was recommended for patient to be discharged to SNF with memory care and patient has been awaiting for placement. Patient is medically and psychiatrically cleared for discharge.      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  BPH stable continue with Flomax.  Hypertension well-controlled continue with lisinopril  Constipation continue with MiraLAX daily  Dementia no behavioral abnormalities today no sitter required family at bedside.  Psychiatric consulted and medications changed.   DVT prophylaxis: (Lovenox Code Status: DNR Family Communication: Discussed plan with the patient and wife at bedside Disposition Plan: Awaiting placement to SNF   Consultants:   Psychiatry   Procedures: None  Antimicrobials: Completed the course of antibiotic  Subjective: No new complaints  Objective: Vitals:   07/18/17 2100 07/19/17 0629 07/19/17 0940 07/19/17 1431  BP: 135/82 122/80 (!) 144/81 133/65  Pulse: 68 77  65  Resp: 16 18  16   Temp: 98.5 F (36.9 C) 98.2 F (36.8 C)  98 F (36.7 C)  TempSrc: Oral Oral  Oral  SpO2: 98% 99%  97%    Weight:      Height:        Intake/Output Summary (Last 24 hours) at 07/19/2017 1700 Last data filed at 07/19/2017 1503 Gross per 24 hour  Intake 600 ml  Output 2050 ml  Net -1450 ml   Filed Weights   06/25/17 1321  Weight: 65.8 kg (145 lb)    Examination:  General exam: Appears calm and comfortable  Respiratory system: Clear to auscultation. Respiratory effort normal. Cardiovascular system: S1 & S2 heard, RRR. No JVD, murmurs, No pedal edema. Gastrointestinal system: Abdomen is nondistended, soft and nontender. No organomegaly or masses felt. Normal bowel sounds heard. Central nervous system: Alert and oriented. No focal neurological deficits. Extremities: Symmetric 5 x 5 power. Skin: No rashes, lesions or ulcers Psychiatry:  Mood & affect appropriate.     Data Reviewed: I have personally reviewed following labs and imaging studies  CBC: Recent Labs  Lab 07/14/17 0529  WBC 7.3  HGB 13.1  HCT 38.8*  MCV 98.7  PLT 710   Basic Metabolic Panel: Recent Labs  Lab 07/14/17 0529  NA 136  K 3.8  CL 103  CO2 24  GLUCOSE 90  BUN 15  CREATININE 0.56*  CALCIUM 8.8*   GFR: Estimated Creatinine Clearance: 82.3 mL/min (A) (by C-G formula based on SCr of 0.56 mg/dL (L)). Liver Function Tests: No results for input(s): AST, ALT, ALKPHOS, BILITOT, PROT, ALBUMIN in the last 168 hours. No results for input(s): LIPASE, AMYLASE in the last 168 hours. No results for input(s): AMMONIA in the last 168 hours. Coagulation Profile: No results for input(s): INR, PROTIME  in the last 168 hours. Cardiac Enzymes: No results for input(s): CKTOTAL, CKMB, CKMBINDEX, TROPONINI in the last 168 hours. BNP (last 3 results) No results for input(s): PROBNP in the last 8760 hours. HbA1C: No results for input(s): HGBA1C in the last 72 hours. CBG: No results for input(s): GLUCAP in the last 168 hours. Lipid Profile: No results for input(s): CHOL, HDL, LDLCALC, TRIG, CHOLHDL, LDLDIRECT in the  last 72 hours. Thyroid Function Tests: No results for input(s): TSH, T4TOTAL, FREET4, T3FREE, THYROIDAB in the last 72 hours. Anemia Panel: No results for input(s): VITAMINB12, FOLATE, FERRITIN, TIBC, IRON, RETICCTPCT in the last 72 hours. Sepsis Labs: No results for input(s): PROCALCITON, LATICACIDVEN in the last 168 hours.  No results found for this or any previous visit (from the past 240 hour(s)).       Radiology Studies: No results found.      Scheduled Meds: . aspirin EC  81 mg Oral Daily  . cholecalciferol  1,000 Units Oral Daily  . citalopram  10 mg Oral Daily  . divalproex  250 mg Oral Daily  . divalproex  500 mg Oral QHS  . enoxaparin (LOVENOX) injection  40 mg Subcutaneous Q24H  . finasteride  5 mg Oral Daily  . haloperidol  2 mg Oral TID  . hydrALAZINE  5 mg Intravenous Once  . lisinopril  20 mg Oral Daily  . nicotine  14 mg Transdermal Daily  . polyethylene glycol  17 g Oral Daily  . tamsulosin  0.4 mg Oral QPC supper  . traZODone  50 mg Oral QHS   Continuous Infusions:   LOS: 24 days    Time spent: 35 minutes    Hosie Poisson, MD [facility] Pager [phone]  If 7PM-7AM, please contact night-coverage [email or web address] Password [facility] 07/19/2017, 5:00 PM

## 2017-07-19 NOTE — Progress Notes (Signed)
Physical Therapy Treatment Patient Details Name: Joe Dawson MRN: [id number] DOB: 1951-01-27 Today's Date: 07/19/2017    History of Present Illness 67 year old with a history of essential hypertension, BPH, degenerative disc disease, dementia with behavioral changes, blindness came to the hospital 06/25/17 for evaluation of increasing agitation and aggressive behavior.  Upon admission he was found to urinary tract infection    PT Comments    Pt more alert and pleasant following all commands and expresses all needs.  Assisted OOB to [facility] to void required + 2 assist for safety.  Pt more able to self rise but required assist to complete 1/4 pivot to [facility] (pt low vision).  Assisted with sit to stand to amb with B platform EVA walker a greater distance than regular RW.  Pt present with delayed initiation and required VC's to "step big" to progress forward motion.  Very small, tight, shuffled steps with B hip and knee flexed posture.  Pt was able to progress gait to 15 feet.  Pt will need ST SNF for Rehab to regain prior level of mobility.    Follow Up Recommendations  SNF     Equipment Recommendations       Recommendations for Other Services       Precautions / Restrictions Precautions Precautions: Fall Precaution Comments: blind Restrictions Weight Bearing Restrictions: No Other Position/Activity Restrictions: WBAT     Mobility  Bed Mobility Overal bed mobility: Needs Assistance Bed Mobility: Supine to Sit     Supine to sit: Max assist     General bed mobility comments: pt more able to self perform with increased time and extra assist to complete scooting to EOB  Transfers Overall transfer level: Needs assistance Equipment used: None Transfers: Sit to/from Stand Sit to Stand: Total assist;+2 physical assistance;+2 safety/equipment;Mod assist         General transfer comment: pt more able to self rise with increased time but present with B hip and knee flexion.  remains  rigid throughout.    Ambulation/Gait Ambulation/Gait assistance: Max assist Ambulation Distance (Feet): 15 Feet Assistive device: Bilateral platform walker(EVA walker) Gait Pattern/deviations: Step-to pattern;Step-through pattern;Decreased step length - right;Decreased step length - left;Decreased stance time - right;Shuffle;Trunk flexed;Narrow base of support Gait velocity: decreased   General Gait Details: Used B platform Walker for increased support.  Required MAX VC's to "step big" with very short steps and increased time to initate.  B hips and knees remained flex unable to stand erect.  Recliner following behind for safety.     Stairs            Wheelchair Mobility    Modified Rankin (Stroke Patients Only)       Balance                                            Cognition Arousal/Alertness: Awake/alert Behavior During Therapy: WFL for tasks assessed/performed Overall Cognitive Status: Within Functional Limits for tasks assessed                                 General Comments: AxO x 3 following all commands and pleasant      Exercises      General Comments        Pertinent Vitals/Pain Pain Assessment: No/denies pain    Home Living  Prior Function            PT Goals (current goals can now be found in the care plan section) Progress towards PT goals: Progressing toward goals    Frequency    Min 2X/week      PT Plan Current plan remains appropriate    Co-evaluation              AM-PAC PT "6 Clicks" Daily Activity  Outcome Measure  Difficulty turning over in bed (including adjusting bedclothes, sheets and blankets)?: A Lot Difficulty moving from lying on back to sitting on the side of the bed? : A Lot Difficulty sitting down on and standing up from a chair with arms (e.g., wheelchair, bedside commode, etc,.)?: A Lot Help needed moving to and from a bed to chair (including  a wheelchair)?: A Lot Help needed walking in hospital room?: A Lot Help needed climbing 3-5 steps with a railing? : A Lot 6 Click Score: 12    End of Session Equipment Utilized During Treatment: Gait belt Activity Tolerance: Patient tolerated treatment well Patient left: in chair;with family/visitor present;with chair alarm set   PT Visit Diagnosis: Unsteadiness on feet (R26.81);History of falling (Z91.81)     Time: 1105-1130 PT Time Calculation (min) (ACUTE ONLY): 25 min  Charges:  $Gait Training: 8-22 mins $Therapeutic Activity: 8-22 mins                    G Codes:       Rica Koyanagi  PTA [facility]  Acute  Rehab Pager      [phone]

## 2017-07-20 NOTE — Progress Notes (Signed)
PROGRESS NOTE    Joe Dawson  UXN:[id number] DOB: 28-Sep-1950 DOA: 06/25/2017 PCP: Gayland Curry, DO    Brief Narrative:  Patient is a 67 year old male with history of hypertension, severe dementia with behavioral changes, blindness and BPH who presented to the emergency department on 06/25/17 with increasing agitation and aggressive behavior. Upon initial evaluation was found to have a UTI and he was treated with Rocephin for 5 days. Psych evaluated patient and adjusted medications, felt that psychosis was due to UTI. Was recommended for patient to be discharged to SNF with memory care and patient has been awaiting for placement. Patient is medically and psychiatrically cleared for discharge.      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  No new changes in medications.  BPH stable continue with Flomax.  Hypertension well-controlled continue with lisinopril  Constipation continue with MiraLAX as needed  Dementia no behavioral abnormalities today no sitter required family at bedside.  Psychiatric consulted and medications changed.  No change in medications   DVT prophylaxis: (Lovenox Code Status: DNR Family Communication: None at bedside today Disposition Plan: Awaiting placement to SNF   Consultants:   Psychiatry   Procedures: None  Antimicrobials: Completed the course of antibiotic  Subjective: No new complaints patient appears to be in good spirits no distress  Objective: Vitals:   07/19/17 2100 07/20/17 0611 07/20/17 0918 07/20/17 1344  BP: (!) 150/86 126/87 107/68 120/69  Pulse: 61 60 60 61  Resp: 15 18 18 18   Temp: 98.7 F (37.1 C) 98.2  F (36.8 C) 97.9 F (36.6 C) 98.5 F (36.9 C)  TempSrc: Oral   Oral  SpO2: 99% 96% 97% 96%  Weight:      Height:        Intake/Output Summary (Last 24 hours) at March 05, 202019 1349 Last data filed at March 05, 202019 1230 Gross per 24 hour  Intake 1080 ml  Output 1400 ml  Net -320 ml   Filed Weights   06/25/17 1321  Weight: 65.8 kg (145 lb)    Examination: Unchanged from yesterday  General exam: Appears calm and comfortable  Respiratory system: Clear to auscultation. Respiratory effort normal. Cardiovascular system: S1 & S2 heard, RRR. No JVD, murmurs, No pedal edema. Gastrointestinal system: Abdomen is nondistended, soft and nontender. No organomegaly or masses felt. Normal bowel sounds heard. Central nervous system: Alert and oriented. No focal neurological deficits. Extremities: Symmetric 5 x 5 power. Skin: No rashes, lesions or ulcers Psychiatry:  Mood & affect appropriate.     Data Reviewed: I have personally reviewed following labs and imaging studies  CBC: Recent Labs  Lab 07/14/17 0529  WBC 7.3  HGB 13.1  HCT 38.8*  MCV 98.7  PLT 254   Basic Metabolic Panel: Recent Labs  Lab 07/14/17 0529  NA 136  K 3.8  CL 103  CO2 24  GLUCOSE 90  BUN 15  CREATININE 0.56*  CALCIUM 8.8*   GFR: Estimated Creatinine Clearance: 82.3 mL/min (A) (by C-G formula based on SCr of 0.56 mg/dL (L)). Liver Function Tests: No results for input(s): AST, ALT, ALKPHOS, BILITOT, PROT, ALBUMIN in the last 168 hours. No results for input(s): LIPASE, AMYLASE in the  last 168 hours. No results for input(s): AMMONIA in the last 168 hours. Coagulation Profile: No results for input(s): INR, PROTIME in the last 168 hours. Cardiac Enzymes: No results for input(s): CKTOTAL, CKMB, CKMBINDEX, TROPONINI in the last 168 hours. BNP (last 3 results) No results for input(s): PROBNP in the last 8760 hours. HbA1C: No results for input(s): HGBA1C in the last 72 hours. CBG: No results for input(s):  GLUCAP in the last 168 hours. Lipid Profile: No results for input(s): CHOL, HDL, LDLCALC, TRIG, CHOLHDL, LDLDIRECT in the last 72 hours. Thyroid Function Tests: No results for input(s): TSH, T4TOTAL, FREET4, T3FREE, THYROIDAB in the last 72 hours. Anemia Panel: No results for input(s): VITAMINB12, FOLATE, FERRITIN, TIBC, IRON, RETICCTPCT in the last 72 hours. Sepsis Labs: No results for input(s): PROCALCITON, LATICACIDVEN in the last 168 hours.  No results found for this or any previous visit (from the past 240 hour(s)).       Radiology Studies: No results found.      Scheduled Meds: . aspirin EC  81 mg Oral Daily  . cholecalciferol  1,000 Units Oral Daily  . citalopram  10 mg Oral Daily  . divalproex  250 mg Oral Daily  . divalproex  500 mg Oral QHS  . enoxaparin (LOVENOX) injection  40 mg Subcutaneous Q24H  . finasteride  5 mg Oral Daily  . haloperidol  2 mg Oral TID  . hydrALAZINE  5 mg Intravenous Once  . lisinopril  20 mg Oral Daily  . nicotine  14 mg Transdermal Daily  . polyethylene glycol  17 g Oral Daily  . tamsulosin  0.4 mg Oral QPC supper  . traZODone  50 mg Oral QHS   Continuous Infusions:   LOS: 25 days    Time spent: 15 minutes    Joe Poisson, MD [facility] Pager [phone]  If 7PM-7AM, please contact night-coverage [email or web address] Password [facility] 01/17/2018, 1:49 PM

## 2017-07-20 NOTE — Progress Notes (Addendum)
Facility representative at [facility] will visit and evaluate the patient for placement at 4:00pm today.  Nursing staff informed.   The Facility representative will discuss her evaluation  with the facility Director of Nursing to determine if patient is appropriate.  Facility will update CSW 2/29.    Kathrin Greathouse, Latanya Presser, MSW Clinical Social Worker  [phone] 2020-12-1517  3:38 PM

## 2017-07-20 NOTE — NC FL2 (Signed)
[place] LEVEL OF CARE SCREENING TOOL     IDENTIFICATION  Patient Name: Joe Dawson Birthdate: 08-14-1950 Sex: male Admission Date (Current Location): 06/25/2017  [facility] [place] Number:  Herbalist and Address:  [facility],  [place] [address] [place]      Provider Number: [id number]  Attending Physician Name and Address:  Hosie Poisson, MD  Relative Name and Phone Number:       Current Level of Care: Hospital Recommended Level of Care: [place] Prior Approval Number:    Date Approved/Denied:   PASRR Number: Pending  Discharge Plan: SNF    Current Diagnoses: Patient Active Problem List   Diagnosis Date Noted  . UTI (urinary tract infection) 06/25/2017  . Dementia with behavioral disturbance 05/28/2017  . 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 Memory loss 11/21/2016  . Essential hypertension, benign 11/21/2014  . 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 Hyperlipidemia LDL goal <100 08/09/2013  . Blind 08/09/2013  . Vitamin D insufficiency 08/09/2013  . Benign prostatic hypertrophy 08/09/2013  . Hyperglycemia 08/09/2013  . Tobacco abuse 08/09/2013    Orientation RESPIRATION BLADDER Height & Weight     Self  Normal Continent Weight: 145 lb (65.8 kg) Height:  5\' 7"  (170.2 cm)  BEHAVIORAL SYMPTOMS/MOOD NEUROLOGICAL BOWEL NUTRITION STATUS     Continent Diet(See discharge Summary)  AMBULATORY STATUS COMMUNICATION OF NEEDS Skin   Extensive Assist Verbally Normal                       Personal Care Assistance Level of Assistance  Bathing, Feeding, Dressing Bathing Assistance: Maximum assistance Feeding assistance: Limited assistance Dressing Assistance: Maximum assistance     Functional Limitations Info  Sight, Hearing, Speech Sight Info: Impaired Hearing Info: Adequate Speech Info: Adequate    SPECIAL CARE FACTORS FREQUENCY  PT (By licensed PT), OT  (By licensed OT)     PT Frequency: 5x/week  OT Frequency: 5x/week             Contractures Contractures Info: Not present    Additional Factors Info  Code Status, Allergies, Psychotropic Code Status Info: DNR Allergies Info: Allergies: Epinephrine Psychotropic Info: Celexa 10mg , Depakote 250mg -500mg ,Haldol 2mg , Trazadone 50mg ,          Current Medications (2020-07-2517):  This is the current hospital active medication list Current Facility-Administered Medications  Medication Dose Route Frequency Provider Last Rate Last Dose  . acetaminophen (TYLENOL) tablet 650 mg  650 mg Oral Q6H PRN [name], MD   650 mg at 07/15/17 2000  . aspirin EC tablet 81 mg  81 mg Oral Daily [name], MD   81 mg at 07/19/17 0940  . cholecalciferol (VITAMIN D) tablet 1,000 Units  1,000 Units Oral Daily [name], MD   1,000 Units at 07/19/17 0940  . citalopram (CELEXA) tablet 10 mg  10 mg Oral Daily [name], MD   10 mg at 07/19/17 0940  . divalproex (DEPAKOTE SPRINKLE) capsule 250 mg  250 mg Oral Daily Minda Ditto, RPH   250 mg at 07/19/17 0940  . divalproex (DEPAKOTE SPRINKLE) capsule 500 mg  500 mg Oral QHS Minda Ditto, RPH   500 mg at 07/19/17 2104  . enoxaparin (LOVENOX) injection 40 mg  40 mg Subcutaneous Q24H [name], MD   40 mg at 07/19/17 1715  . finasteride (PROSCAR) tablet 5 mg  5  mg Oral Daily [name], MD   5 mg at 07/19/17 0941  . haloperidol (HALDOL) 2 MG/ML solution 2 mg  2 mg Oral TID Minda Ditto, RPH   2 mg at 07/19/17 2104  . haloperidol lactate (HALDOL) injection 5 mg  5 mg Intramuscular Q6H PRN [name], MD   5 mg at 07/01/17 0518  . hydrALAZINE (APRESOLINE) injection 5 mg  5 mg Intravenous Once Schorr, Rhetta Mura, NP      . lisinopril (PRINIVIL,ZESTRIL) tablet 20 mg  20 mg Oral Daily [name], MD   20 mg at 07/19/17 0940  . LORazepam (ATIVAN) tablet 0.5 mg  0.5 mg Oral BID PRN Vance Gather B, MD   0.5 mg at  07/06/17 1726  . nicotine (NICODERM CQ - dosed in mg/24 hours) patch 14 mg  14 mg Transdermal Daily Amin, Ankit Chirag, MD   14 mg at 07/19/17 0941  . polyethylene glycol (MIRALAX / GLYCOLAX) packet 17 g  17 g Oral Daily Emokpae, Courage, MD   17 g at 07/19/17 0940  . tamsulosin (FLOMAX) capsule 0.4 mg  0.4 mg Oral QPC supper [name], MD   0.4 mg at 07/19/17 1714  . traZODone (DESYREL) tablet 50 mg  50 mg Oral QHS [name], MD   50 mg at 07/19/17 2104     Discharge Medications: Please see discharge summary for a list of discharge medications.  Relevant Imaging Results:  Relevant Lab Results:   Additional Information [place], LCSW

## 2017-07-21 MED ORDER — SENNOSIDES-DOCUSATE SODIUM 8.6-50 MG PO TABS
1.0000 | ORAL_TABLET | Freq: Two times a day (BID) | ORAL | Status: DC
  Administered 2017-07-22 – 2017-07-23 (×3): 1 via ORAL
  Filled 2017-07-21 (×3): qty 1

## 2017-07-21 NOTE — Progress Notes (Signed)
PROGRESS NOTE    Joe Dawson  JAS:[id number] DOB: [birth year] DOA: 06/25/2017 PCP: Gayland Curry, DO    Brief Narrative:  Patient is a 67 year old male with history of hypertension, severe dementia with behavioral changes, blindness and BPH who presented to the emergency department on 06/25/17 with increasing agitation and aggressive behavior. Upon initial evaluation was found to have a UTI and he was treated with Rocephin for 5 days. Psych evaluated patient and adjusted medications, felt that psychosis was due to UTI. Was recommended for patient to be discharged to SNF with memory care and patient has been awaiting for placement. Patient is medically and psychiatrically cleared for discharge.      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  No new changes in medications.  BPH stable continue with Flomax.  Hypertension well-controlled continue with lisinopril  Constipation continue with MiraLAX as needed, patient denies any constipation.  Last bowel movement on the 26th.  Add senna and Colace.  Dementia no behavioral abnormalities today no sitter required family at bedside.  Psychiatric consulted and medications changed.  No change in medications   DVT prophylaxis: (Lovenox Code Status: DNR Family Communication: None at bedside today Disposition Plan: Awaiting placement to SNF   Consultants:   Psychiatry   Procedures: None  Antimicrobials: Completed the course of antibiotic  Subjective: No new complaints, patient appears to be in good spirits but patient reports he wants to be discharged as soon as possible.  Objective: Vitals:   07/20/17 2000  07/21/17 0615 07/21/17 0916 07/21/17 1334  BP: (!) 154/108 135/76 (!) 148/72 132/76  Pulse: 64 (!) 59  62  Resp: 18 18  17   Temp: 99.5 F (37.5 C) 98.8 F (37.1 C)  98.7 F (37.1 C)  TempSrc:    Oral  SpO2: 96% 97%  98%  Weight:      Height:        Intake/Output Summary (Last 24 hours) at 07/21/2017 1749 Last data filed at 07/21/2017 1334 Gross per 24 hour  Intake 240 ml  Output 1400 ml  Net -1160 ml   Filed Weights   06/25/17 1321  Weight: 65.8 kg (145 lb)    Examination: No change in exam from yesterday.  General exam: Appears calm and comfortable  Respiratory system: Clear to auscultation. Respiratory effort normal. Cardiovascular system: S1 & S2 heard, RRR. No JVD, murmurs, No pedal edema. Gastrointestinal system: Abdomen is nondistended, soft and nontender. No organomegaly or masses felt. Normal bowel sounds heard. Central nervous system: Alert and oriented. No focal neurological deficits. Extremities: Symmetric 5 x 5 power. Skin: No rashes, lesions or ulcers Psychiatry:  Mood & affect appropriate.     Data Reviewed: I have personally reviewed following labs and imaging studies  CBC: No results for input(s): WBC, NEUTROABS, HGB, HCT, MCV, PLT in the last 168 hours. Basic Metabolic Panel: No results for input(s): NA, K, CL, CO2, GLUCOSE, BUN, CREATININE, CALCIUM, MG, PHOS in the last 168 hours. GFR: Estimated Creatinine Clearance: 82.3 mL/min (A) (by C-G formula based on SCr of 0.56 mg/dL (L)). Liver Function Tests: No results for input(s): AST, ALT, ALKPHOS, BILITOT, PROT, ALBUMIN in the last 168 hours. No  results for input(s): LIPASE, AMYLASE in the last 168 hours. No results for input(s): AMMONIA in the last 168 hours. Coagulation Profile: No results for input(s): INR, PROTIME in the last 168 hours. Cardiac Enzymes: No results for input(s): CKTOTAL, CKMB, CKMBINDEX, TROPONINI in the last 168 hours. BNP (last 3 results) No results for input(s): PROBNP in the  last 8760 hours. HbA1C: No results for input(s): HGBA1C in the last 72 hours. CBG: No results for input(s): GLUCAP in the last 168 hours. Lipid Profile: No results for input(s): CHOL, HDL, LDLCALC, TRIG, CHOLHDL, LDLDIRECT in the last 72 hours. Thyroid Function Tests: No results for input(s): TSH, T4TOTAL, FREET4, T3FREE, THYROIDAB in the last 72 hours. Anemia Panel: No results for input(s): VITAMINB12, FOLATE, FERRITIN, TIBC, IRON, RETICCTPCT in the last 72 hours. Sepsis Labs: No results for input(s): PROCALCITON, LATICACIDVEN in the last 168 hours.  No results found for this or any previous visit (from the past 240 hour(s)).       Radiology Studies: No results found.      Scheduled Meds: . aspirin EC  81 mg Oral Daily  . cholecalciferol  1,000 Units Oral Daily  . citalopram  10 mg Oral Daily  . divalproex  250 mg Oral Daily  . divalproex  500 mg Oral QHS  . enoxaparin (LOVENOX) injection  40 mg Subcutaneous Q24H  . finasteride  5 mg Oral Daily  . haloperidol  2 mg Oral TID  . hydrALAZINE  5 mg Intravenous Once  . lisinopril  20 mg Oral Daily  . nicotine  14 mg Transdermal Daily  . polyethylene glycol  17 g Oral Daily  . tamsulosin  0.4 mg Oral QPC supper  . traZODone  50 mg Oral QHS   Continuous Infusions:   LOS: 26 days    Time spent: 15 minutes    Hosie Poisson, MD [facility] Pager [phone]  If 7PM-7AM, please contact night-coverage [email or web address] Password [facility] 07/21/2017, 5:49 PM

## 2017-07-21 NOTE — Progress Notes (Signed)
CSW following for assistance with disposition- see previous notes, complicated placement.  [facility] of [place] visited pt for evaluation yesterday. Today, facility director is planning meeting with Pt's wife Monday to discuss- no bed offer made as of yet.   Will continue following to assist.  Sharren Bridge, MSW, LCSW Clinical Social Work 07/21/2017 [phone] coverage for [phone]

## 2017-07-21 NOTE — Progress Notes (Signed)
Physical Therapy Treatment Patient Details Name: Joe Dawson MRN: [id number] DOB: 01-14-1951 Today's Date: 07/21/2017    History of Present Illness 67 year old with a history of essential hypertension, BPH, degenerative disc disease, dementia with behavioral changes, blindness came to the hospital 06/25/17 for evaluation of increasing agitation and aggressive behavior.  Upon admission he was found to urinary tract infection    PT Comments    Assisted OOB to [facility] then to amb.  Pt more able to self rise.  Requires + 2 assist for safety and progressing slolwy.  Pt will need ST Rehab at SNF.   Follow Up Recommendations  SNF     Equipment Recommendations       Recommendations for Other Services       Precautions / Restrictions Precautions Precautions: Fall Precaution Comments: blind Restrictions Weight Bearing Restrictions: No Other Position/Activity Restrictions: WBAT     Mobility  Bed Mobility Overal bed mobility: Needs Assistance Bed Mobility: Supine to Sit     Supine to sit: Max assist;Mod assist     General bed mobility comments: pt more able to self perform with increased time and extra assist to complete scooting to EOB  Transfers Overall transfer level: Needs assistance Equipment used: Rolling walker (2 wheeled) Transfers: Sit to/from Stand Sit to Stand: Mod assist;Max assist;+2 physical assistance;+2 safety/equipment         General transfer comment: pt more able to self rise with increased time   Hnad over hand cueing due to low vision  Ambulation/Gait Ambulation/Gait assistance: Max assist;+2 physical assistance;+2 safety/equipment Ambulation Distance (Feet): 8 Feet Assistive device: Bilateral platform walker Gait Pattern/deviations: Step-to pattern;Step-through pattern;Decreased step length - right;Decreased step length - left;Decreased stance time - right;Shuffle;Trunk flexed;Narrow base of support Gait velocity: decreased   General Gait Details: Used  B platform Walker for increased support.  Required MAX VC's to "step big" with very short steps and increased time to initate.  B hips and knees remained flex unable to stand erect.  Recliner following behind for safety.     Stairs            Wheelchair Mobility    Modified Rankin (Stroke Patients Only)       Balance                                            Cognition Arousal/Alertness: Awake/alert Behavior During Therapy: WFL for tasks assessed/performed Overall Cognitive Status: Within Functional Limits for tasks assessed                                 General Comments: AxO x 3 following all commands and pleasant      Exercises      General Comments        Pertinent Vitals/Pain Pain Assessment: No/denies pain    Home Living                      Prior Function            PT Goals (current goals can now be found in the care plan section) Progress towards PT goals: Progressing toward goals    Frequency    Min 2X/week      PT Plan Current plan remains appropriate    Co-evaluation  AM-PAC PT "6 Clicks" Daily Activity  Outcome Measure  Difficulty turning over in bed (including adjusting bedclothes, sheets and blankets)?: A Lot Difficulty moving from lying on back to sitting on the side of the bed? : A Lot Difficulty sitting down on and standing up from a chair with arms (e.g., wheelchair, bedside commode, etc,.)?: A Lot Help needed moving to and from a bed to chair (including a wheelchair)?: A Lot Help needed walking in hospital room?: A Lot Help needed climbing 3-5 steps with a railing? : A Lot 6 Click Score: 12    End of Session Equipment Utilized During Treatment: Gait belt Activity Tolerance: Patient tolerated treatment well Patient left: in chair;with family/visitor present;with chair alarm set Nurse Communication: Mobility status PT Visit Diagnosis: Unsteadiness on feet  (R26.81);History of falling (Z91.81)     Time: 0923-3007 PT Time Calculation (min) (ACUTE ONLY): 27 min  Charges:  $Gait Training: 8-22 mins $Therapeutic Activity: 8-22 mins                    G Codes:       Rica Koyanagi  PTA [facility]  Acute  Rehab Pager      [phone]

## 2017-07-22 NOTE — Progress Notes (Signed)
PROGRESS NOTE    Joe Dawson  LTJ:[id number] DOB: 06-Jan-1951 DOA: 06/25/2017 PCP: Gayland Curry, DO    Brief Narrative:  Patient is a 67 year old male with history of hypertension, severe dementia with behavioral changes, blindness and BPH who presented to the emergency department on 06/25/17 with increasing agitation and aggressive behavior. Upon initial evaluation was found to have a UTI and he was treated with Rocephin for 5 days. Psych evaluated patient and adjusted medications, felt that psychosis was due to UTI. Was recommended for patient to be discharged to SNF with memory care and patient has been awaiting for placement. Patient is medically and psychiatrically cleared for discharge.      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  No new changes in medications.  BPH stable continue with Flomax.  Hypertension well-controlled continue with lisinopril  Constipation continue with MiraLAX as needed, patient denies any constipation.  Last bowel movement on the 26th.  Add senna and Colace. Pt reports having BM this am. And has good appetite.   Dementia no behavioral abnormalities today no sitter required family at bedside.  Psychiatric consulted and medications changed.  No change in medications   DVT prophylaxis: (Lovenox Code Status: DNR Family Communication: discussed with wife today. She reports she plans to see a facility on Monday.  Disposition Plan: Awaiting placement to SNF   Consultants:   Psychiatry   Procedures: None  Antimicrobials: Completed the course of antibiotic  Subjective: No new complaints, has good appetite and reports having  BM today.   Objective: Vitals:   07/21/17 2108 07/22/17 0557 07/22/17 1032 07/22/17 1414  BP: (!) 164/86 107/63 (!) 112/56 (!) 96/58  Pulse: 74 (!) 57  69  Resp: 16 17  16   Temp: 98.6 F (37 C) 98.4 F (36.9 C)  98.4 F (36.9 C)  TempSrc: Oral Oral  Oral  SpO2: 94% 97%  97%  Weight:      Height:        Intake/Output Summary (Last 24 hours) at 07/22/2017 1652 Last data filed at 07/22/2017 1414 Gross per 24 hour  Intake 720 ml  Output 1200 ml  Net -480 ml   Filed Weights   06/25/17 1321  Weight: 65.8 kg (145 lb)    Examination: No change in exam from yesterday.  General exam: Appears calm and comfortable  Respiratory system: Clear to auscultation. Respiratory effort normal. Cardiovascular system: S1 & S2 heard, RRR. No JVD, murmurs, No pedal edema. Gastrointestinal system: Abdomen is nondistended, soft and nontender. No organomegaly or masses felt. Normal bowel sounds heard. Central nervous system: Alert and oriented. No focal neurological deficits. Extremities: Symmetric 5 x 5 power. Skin: No rashes, lesions or ulcers Psychiatry:  Mood & affect appropriate.     Data Reviewed: I have personally reviewed following labs and imaging studies  CBC: No results for input(s): WBC, NEUTROABS, HGB, HCT, MCV, PLT in the last 168 hours. Basic Metabolic Panel: No results for input(s): NA, K, CL, CO2, GLUCOSE, BUN, CREATININE, CALCIUM, MG, PHOS in the last 168 hours. GFR: Estimated Creatinine Clearance: 82.3 mL/min (A) (by C-G formula based on SCr of 0.56 mg/dL (L)). Liver Function Tests: No results for  input(s): AST, ALT, ALKPHOS, BILITOT, PROT, ALBUMIN in the last 168 hours. No results for input(s): LIPASE, AMYLASE in the last 168 hours. No results for input(s): AMMONIA in the last 168 hours. Coagulation Profile: No results for input(s): INR, PROTIME in the last 168 hours. Cardiac Enzymes: No results for input(s): CKTOTAL, CKMB, CKMBINDEX, TROPONINI in the last 168 hours. BNP  (last 3 results) No results for input(s): PROBNP in the last 8760 hours. HbA1C: No results for input(s): HGBA1C in the last 72 hours. CBG: No results for input(s): GLUCAP in the last 168 hours. Lipid Profile: No results for input(s): CHOL, HDL, LDLCALC, TRIG, CHOLHDL, LDLDIRECT in the last 72 hours. Thyroid Function Tests: No results for input(s): TSH, T4TOTAL, FREET4, T3FREE, THYROIDAB in the last 72 hours. Anemia Panel: No results for input(s): VITAMINB12, FOLATE, FERRITIN, TIBC, IRON, RETICCTPCT in the last 72 hours. Sepsis Labs: No results for input(s): PROCALCITON, LATICACIDVEN in the last 168 hours.  No results found for this or any previous visit (from the past 240 hour(s)).       Radiology Studies: No results found.      Scheduled Meds: . aspirin EC  81 mg Oral Daily  . cholecalciferol  1,000 Units Oral Daily  . citalopram  10 mg Oral Daily  . divalproex  250 mg Oral Daily  . divalproex  500 mg Oral QHS  . enoxaparin (LOVENOX) injection  40 mg Subcutaneous Q24H  . finasteride  5 mg Oral Daily  . haloperidol  2 mg Oral TID  . hydrALAZINE  5 mg Intravenous Once  . lisinopril  20 mg Oral Daily  . nicotine  14 mg Transdermal Daily  . polyethylene glycol  17 g Oral Daily  . senna-docusate  1 tablet Oral BID  . tamsulosin  0.4 mg Oral QPC supper  . traZODone  50 mg Oral QHS   Continuous Infusions:   LOS: 27 days    Time spent: 15 minutes    Hosie Poisson, MD [facility] Pager [phone]  If 7PM-7AM, please contact night-coverage [email or web address] Password [facility] 07/22/2017, 4:52 PM

## 2017-07-22 NOTE — Plan of Care (Signed)
Good night.  Tooks meds without difficulty.   More communicative.

## 2017-07-23 NOTE — Progress Notes (Signed)
PROGRESS NOTE    Joe Dawson  HTD:[id number] DOB: 19-Jan-1951 DOA: 06/25/2017 PCP: Gayland Curry, DO    Brief Narrative:  Patient is a 67 year old male with history of hypertension, severe dementia with behavioral changes, blindness and BPH who presented to the emergency department on 06/25/17 with increasing agitation and aggressive behavior. Upon initial evaluation was found to have a UTI and he was treated with Rocephin for 5 days. Psych evaluated patient and adjusted medications, felt that psychosis was due to UTI. Was recommended for patient to be discharged to SNF with memory care and patient has been awaiting for placement. Patient is medically and psychiatrically cleared for discharge.      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  No new changes in medications.  BPH stable continue with Flomax.  Hypertension well-controlled continue with lisinopril  Constipation  RESOLVED. Today he has 3 to 4 bowel movements and senna, miralax stopped.   Dementia no behavioral abnormalities today no sitter required family at bedside.  Psychiatric consulted and medications changed.  No change in medications   DVT prophylaxis: (Lovenox Code Status: DNR Family Communication: none at bedside today.  Disposition Plan: Awaiting placement to SNF   Consultants:   Psychiatry   Procedures: None  Antimicrobials: Completed the course of antibiotic  Subjective: 3 to 4 bowel movements. No other complaints   Objective: Vitals:   07/22/17 1032 07/22/17 1414 07/22/17 1939 07/23/17 0445  BP: (!) 112/56 (!) 96/58 117/65 (!) 158/97  Pulse:  69 (!) 58 73  Resp:  16 15 14     Temp:  98.4 F (36.9 C) 98.5 F (36.9 C) 98.1 F (36.7 C)  TempSrc:  Oral Oral Oral  SpO2:  97% 95% 97%  Weight:      Height:        Intake/Output Summary (Last 24 hours) at 07/23/2017 1715 Last data filed at 07/23/2017 1644 Gross per 24 hour  Intake 650 ml  Output 2750 ml  Net -2100 ml   Filed Weights   06/25/17 1321  Weight: 65.8 kg (145 lb)    Examination:   General exam: Appears calm and comfortable  Respiratory system: Clear to auscultation. Respiratory effort normal. Cardiovascular system: S1 & S2 heard, RRR. No JVD, murmurs, No pedal edema. Gastrointestinal system:  Abd is soft NT nd bs+ Central nervous system: Alert , and oriented to place and person.  Extremities: no pedal edema  Skin: No rashes, lesions or ulcers Psychiatry:  Mood good.     Data Reviewed: I have personally reviewed following labs and imaging studies  CBC: No results for input(s): WBC, NEUTROABS, HGB, HCT, MCV, PLT in the last 168 hours. Basic Metabolic Panel: No results for input(s): NA, K, CL, CO2, GLUCOSE, BUN, CREATININE, CALCIUM, MG, PHOS in the last 168 hours. GFR: Estimated Creatinine Clearance: 82.3 mL/min (A) (by C-G formula based on SCr of 0.56 mg/dL (L)). Liver Function Tests: No results for input(s): AST, ALT, ALKPHOS, BILITOT, PROT, ALBUMIN in the last 168 hours. No results for input(s): LIPASE, AMYLASE in the last 168 hours. No results for input(s): AMMONIA in the last 168 hours. Coagulation Profile: No results for input(s): INR, PROTIME in the  last 168 hours. Cardiac Enzymes: No results for input(s): CKTOTAL, CKMB, CKMBINDEX, TROPONINI in the last 168 hours. BNP (last 3 results) No results for input(s): PROBNP in the last 8760 hours. HbA1C: No results for input(s): HGBA1C in the last 72 hours. CBG: No results for input(s): GLUCAP in the last 168 hours. Lipid Profile: No results for input(s): CHOL, HDL, LDLCALC, TRIG, CHOLHDL, LDLDIRECT in the last 72 hours. Thyroid  Function Tests: No results for input(s): TSH, T4TOTAL, FREET4, T3FREE, THYROIDAB in the last 72 hours. Anemia Panel: No results for input(s): VITAMINB12, FOLATE, FERRITIN, TIBC, IRON, RETICCTPCT in the last 72 hours. Sepsis Labs: No results for input(s): PROCALCITON, LATICACIDVEN in the last 168 hours.  No results found for this or any previous visit (from the past 240 hour(s)).       Radiology Studies: No results found.      Scheduled Meds: . aspirin EC  81 mg Oral Daily  . cholecalciferol  1,000 Units Oral Daily  . citalopram  10 mg Oral Daily  . divalproex  250 mg Oral Daily  . divalproex  500 mg Oral QHS  . enoxaparin (LOVENOX) injection  40 mg Subcutaneous Q24H  . finasteride  5 mg Oral Daily  . haloperidol  2 mg Oral TID  . hydrALAZINE  5 mg Intravenous Once  . lisinopril  20 mg Oral Daily  . nicotine  14 mg Transdermal Daily  . tamsulosin  0.4 mg Oral QPC supper  . traZODone  50 mg Oral QHS   Continuous Infusions:   LOS: 28 days    Time spent: 15 minutes    Hosie Poisson, MD [facility] Pager [phone]  If 7PM-7AM, please contact night-coverage [email or web address] Password [email or web address] 07/23/2017, 5:15 PM

## 2017-07-23 NOTE — Progress Notes (Signed)
Pt has had 3-4 mushy BMs in about 30 hours. Text sent to Dr Karleen Hampshire re: do we reduce his laxatives? Saga Balthazar, [employer]

## 2017-07-24 NOTE — Discharge Summary (Signed)
Physician Discharge Summary  Joe Dawson KYH:[id number] DOB: 09-08-1950 DOA: 06/25/2017  PCP: Gayland Curry, DO  Admit date: 06/25/2017 Discharge date: 07/24/2017  Admitted From: Home. Disposition:  SNF.  Recommendations for Outpatient Follow-up:  1. Follow up with PCP in 1-2 weeks 2. Please obtain BMP/CBC in one week    Discharge Condition: stable.  CODE STATUS:dnr Diet recommendation: Heart Healthy   Brief/Interim Summary: Patient is a 67 year old male with history of hypertension, severe dementia with behavioral changes, blindness and BPH who presented to the emergency departmenton 2/3/19with increasing agitation and aggressive behavior. Upon initial evaluation was found to have a UTI and he was treated with Rocephin for 5 days. Psych evaluated patient and adjusted medications, felt that psychosis was due to UTI. Was recommended for patient to be discharged to SNF with memory care and patient has been awaiting for placement. Patient is medically and psychiatrically cleared for discharge.  Patient's wife to discuss placement with [facility] center today.  His bowel movements have improved after stopping senna and miralax.     Discharge Diagnoses:  Principal Problem:   Dementia with behavioral disturbance Active Problems:   Blind   Optic nerve atrophy, bilateral   Essential hypertension, benign   Hearing loss secondary to cerumen impaction, bilateral   Memory loss   UTI (urinary tract infection)    Urinary tract infection Completed the course of antibiotics symptoms have resolved.  \   BPH stable continue with Flomax.  Hypertension bp parameters slightly elevated than normal, but pt remains asymptomatic.   Constipation  RESOLVED. Last 24 hours he had 3 BM and senna, miralax stopped.   Dementia no behavioral abnormalities today no sitter required family at bedside.  Psychiatric consulted and medications changed.  No change in medications    Discharge  Instructions  Discharge Instructions    Diet - low sodium heart healthy   Complete by:  As directed    Discharge instructions   Complete by:  As directed    PLEASE FOLLOW UP WITH PCP IN ONE WEEK.     Allergies as of 07/24/2017      Reactions   Epinephrine    Legs shake      Medication List    STOP taking these medications   Fish Oil 1000 MG Caps   magnesium 30 MG tablet     TAKE these medications   acetaminophen 325 MG tablet Commonly known as:  TYLENOL Take 650 mg by mouth 2 (two) times daily.   aspirin 81 MG tablet Take 81 mg by mouth 2 (two) times daily.   cholecalciferol 1000 units tablet Commonly known as:  VITAMIN D Take 1,000 Units by mouth daily. What changed:  Another medication with the same name was removed. Continue taking this medication, and follow the directions you see here.   citalopram 10 MG tablet Commonly known as:  CELEXA Take 10 mg by mouth daily.   divalproex 125 MG capsule Commonly known as:  DEPAKOTE SPRINKLE Take 250-500 mg by mouth 2 (two) times daily. 250 mg QAM and 500 mg QHS   finasteride 5 MG tablet Commonly known as:  PROSCAR Take 5 mg by mouth daily.   haloperidol 2 MG tablet Commonly known as:  HALDOL Take 2 mg by mouth 3 (three) times daily.   lisinopril 20 MG tablet Commonly known as:  PRINIVIL,ZESTRIL Take 20 mg by mouth daily. What changed:  Another medication with the same name was removed. Continue taking this medication, and follow the directions  you see here.   LORazepam 0.5 MG tablet Commonly known as:  ATIVAN Take 1 tablet (0.5 mg total) by mouth daily as needed for anxiety.   tamsulosin 0.4 MG Caps capsule Commonly known as:  FLOMAX Take 0.4 mg by mouth daily after supper.   traZODone 50 MG tablet Commonly known as:  DESYREL Take 50 mg by mouth at bedtime.      Contact information for after-discharge care    [facility] .   Service:  Skilled Nursing Contact  information: [address] [place] [phone]             Allergies  Allergen Reactions  . Epinephrine     Legs shake    Consultations:  Psychiatry     Procedures/Studies: Ct Head Wo Contrast  Result Date: 06/25/2017 CLINICAL DATA:  Neck pain. EXAM: CT HEAD WITHOUT CONTRAST CT CERVICAL SPINE WITHOUT CONTRAST TECHNIQUE: Multidetector CT imaging of the head and cervical spine was performed following the standard protocol without intravenous contrast. Multiplanar CT image reconstructions of the cervical spine were also generated. COMPARISON:  None. FINDINGS: CT HEAD FINDINGS Brain: No evidence of acute infarction, hemorrhage, hydrocephalus, extra-axial collection or mass lesion/mass effect. Moderate brain parenchymal volume loss. Mild deep white matter microangiopathy. Small lacunar infarcts in bilateral basal ganglia. Vascular: Calcific atherosclerotic disease at the skull base. Skull: Normal. Negative for fracture or focal lesion. Sinuses/Orbits: Minimal opacification of the ethmoid air cells. Other: None CT CERVICAL SPINE FINDINGS Alignment: Reversal of cervical lordosis. Skull base and vertebrae: No acute fracture. 11 mm sclerotic focus within the base of the dense. Soft tissues and spinal canal: No prevertebral fluid or swelling. No visible canal hematoma. Disc levels: Multilevel osteoarthritic changes with disc space narrowing, remodeling of vertebral bodies, osteophyte formation and posterior facet arthropathy. Likely degenerative reversal of cervical lordosis centered at C5-C6. Upper chest: Mild paraseptal emphysema. Other: None. IMPRESSION: No acute intracranial abnormality. Atrophy, chronic microvascular disease. 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 No evidence of acute traumatic injury to the cervical spine. Multilevel osteoarthritic changes of the cervical spine with reversal of cervical lordosis. Single sclerotic 11 mm focus  within the dense. In the absence of malignancy capable of producing sclerotic lesions this may represent a bone island. Osteolytic versus chronic osteoarthritic changes at the left sternoclavicular joint. Please correlate clinically. Electronically Signed   By: Fidela Salisbury M.D.   On: 06/25/2017 09:09   Ct Cervical Spine Wo Contrast  Result Date: 06/25/2017 CLINICAL DATA:  Neck pain. EXAM: CT HEAD WITHOUT CONTRAST CT CERVICAL SPINE WITHOUT CONTRAST TECHNIQUE: Multidetector CT imaging of the head and cervical spine was performed following the standard protocol without intravenous contrast. Multiplanar CT image reconstructions of the cervical spine were also generated. COMPARISON:  None. FINDINGS: CT HEAD FINDINGS Brain: No evidence of acute infarction, hemorrhage, hydrocephalus, extra-axial collection or mass lesion/mass effect. Moderate brain parenchymal volume loss. Mild deep white matter microangiopathy. Small lacunar infarcts in bilateral basal ganglia. Vascular: Calcific atherosclerotic disease at the skull base. Skull: Normal. Negative for fracture or focal lesion. Sinuses/Orbits: Minimal opacification of the ethmoid air cells. Other: None CT CERVICAL SPINE FINDINGS Alignment: Reversal of cervical lordosis. Skull base and vertebrae: No acute fracture. 11 mm sclerotic focus within the base of the dense. Soft tissues and spinal canal: No prevertebral fluid or swelling. No visible canal hematoma. Disc levels: Multilevel osteoarthritic changes with disc space narrowing, remodeling of vertebral bodies, osteophyte formation and  posterior facet arthropathy. Likely degenerative reversal of cervical lordosis centered at C5-C6. Upper chest: Mild paraseptal emphysema. Other: None. IMPRESSION: No acute intracranial abnormality. Atrophy, chronic microvascular disease. 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 No evidence of acute traumatic injury to the cervical spine. Multilevel  osteoarthritic changes of the cervical spine with reversal of cervical lordosis. Single sclerotic 11 mm focus within the dense. In the absence of malignancy capable of producing sclerotic lesions this may represent a bone island. Osteolytic versus chronic osteoarthritic changes at the left sternoclavicular joint. Please correlate clinically. Electronically Signed   By: Fidela Salisbury M.D.   On: 06/25/2017 09:09       Subjective: NO NEW COMPLAINTS.  Discharge Exam: Vitals:   07/23/17 0445 07/23/17 2145  BP: (!) 158/97 (!) 151/88  Pulse: 73 70  Resp: 14 16  Temp: 98.1 F (36.7 C) 98.2 F (36.8 C)  SpO2: 97% 95%   Vitals:   07/22/17 1414 07/22/17 1939 07/23/17 0445 07/23/17 2145  BP: (!) 96/58 117/65 (!) 158/97 (!) 151/88  Pulse: 69 (!) 58 73 70  Resp: 16 15 14 16   Temp: 98.4 F (36.9 C) 98.5 F (36.9 C) 98.1 F (36.7 C) 98.2 F (36.8 C)  TempSrc: Oral Oral Oral Oral  SpO2: 97% 95% 97% 95%  Weight:      Height:        General: Pt is alert, awake, not in acute distress Cardiovascular: RRR, S1/S2 +, no rubs, no gallops Respiratory: CTA bilaterally, no wheezing, no rhonchi Abdominal: Soft, NT, ND, bowel sounds + Extremities: no edema, no cyanosis    The results of significant diagnostics from this hospitalization (including imaging, microbiology, ancillary and laboratory) are listed below for reference.     Microbiology: No results found for this or any previous visit (from the past 240 hour(s)).   Labs: BNP (last 3 results) No results for input(s): BNP in the last 8760 hours. Basic Metabolic Panel: No results for input(s): NA, K, CL, CO2, GLUCOSE, BUN, CREATININE, CALCIUM, MG, PHOS in the last 168 hours. Liver Function Tests: No results for input(s): AST, ALT, ALKPHOS, BILITOT, PROT, ALBUMIN in the last 168 hours. No results for input(s): LIPASE, AMYLASE in the last 168 hours. No results for input(s): AMMONIA in the last 168 hours. CBC: No results for  input(s): WBC, NEUTROABS, HGB, HCT, MCV, PLT in the last 168 hours. Cardiac Enzymes: No results for input(s): CKTOTAL, CKMB, CKMBINDEX, TROPONINI in the last 168 hours. BNP: Invalid input(s): POCBNP CBG: No results for input(s): GLUCAP in the last 168 hours. D-Dimer No results for input(s): DDIMER in the last 72 hours. Hgb A1c No results for input(s): HGBA1C in the last 72 hours. Lipid Profile No results for input(s): CHOL, HDL, LDLCALC, TRIG, CHOLHDL, LDLDIRECT in the last 72 hours. Thyroid function studies No results for input(s): TSH, T4TOTAL, T3FREE, THYROIDAB in the last 72 hours.  Invalid input(s): FREET3 Anemia work up No results for input(s): VITAMINB12, FOLATE, FERRITIN, TIBC, IRON, RETICCTPCT in the last 72 hours. Urinalysis    Component Value Date/Time   COLORURINE YELLOW 06/25/2017 0910   APPEARANCEUR HAZY (A) 06/25/2017 0910   LABSPEC 1.012 06/25/2017 0910   PHURINE 7.0 06/25/2017 0910   GLUCOSEU NEGATIVE 06/25/2017 0910   HGBUR SMALL (A) 06/25/2017 0910   BILIRUBINUR NEGATIVE 06/25/2017 0910   KETONESUR NEGATIVE 06/25/2017 0910   PROTEINUR NEGATIVE 06/25/2017 0910   NITRITE POSITIVE (A) 06/25/2017 0910   LEUKOCYTESUR MODERATE (A) 06/25/2017 0910   Sepsis Labs  Invalid input(s): PROCALCITONIN,  WBC,  LACTICIDVEN Microbiology No results found for this or any previous visit (from the past 240 hour(s)).   Time coordinating discharge: Over 30 minutes  SIGNED:   Hosie Poisson, MD  [facility] 07/24/2017, 12:18 PM Pager   If 7PM-7AM, please contact night-coverage [email or web address] Password [email or web address]

## 2017-07-24 NOTE — Progress Notes (Signed)
PROGRESS NOTE    Joe Dawson  ZOX:[id number] DOB: 12-16-1950 DOA: 06/25/2017 PCP: Gayland Curry, DO    Brief Narrative:  Patient is a 67 year old male with history of hypertension, severe dementia with behavioral changes, blindness and BPH who presented to the emergency department on 06/25/17 with increasing agitation and aggressive behavior. Upon initial evaluation was found to have a UTI and he was treated with Rocephin for 5 days. Psych evaluated patient and adjusted medications, felt that psychosis was due to UTI. Was recommended for patient to be discharged to SNF with memory care and patient has been awaiting for placement. Patient is medically and psychiatrically cleared for discharge.  Patient's wife to discuss placement with [facility] center today.  His bowel movements have improved after stopping senna and miralax.      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  \   BPH stable continue with Flomax.  Hypertension bp parameters slightly elevated than normal, but pt remains asymptomatic.   Constipation  RESOLVED. Last 24 hours he had 3 BM and senna, miralax stopped.   Dementia no behavioral abnormalities today no sitter required family at bedside.  Psychiatric consulted and medications changed.  No change in medications   DVT prophylaxis: (Lovenox Code Status: DNR Family Communication: none at bedside today.  Disposition Plan: Awaiting placement to SNF   Consultants:   Psychiatry   Procedures: None  Antimicrobials: Completed the course of antibiotic  Subjective: Calm and in good spirits. No new complaints.   Objective: Vitals:   07/22/17  1414 07/22/17 1939 07/23/17 0445 07/23/17 2145  BP: (!) 96/58 117/65 (!) 158/97 (!) 151/88  Pulse: 69 (!) 58 73 70  Resp: 16 15 14 16   Temp: 98.4 F (36.9 C) 98.5 F (36.9 C) 98.1 F (36.7 C) 98.2 F (36.8 C)  TempSrc: Oral Oral Oral Oral  SpO2: 97% 95% 97% 95%  Weight:      Height:        Intake/Output Summary (Last 24 hours) at 07/24/2017 1010 Last data filed at 07/24/2017 0953 Gross per 24 hour  Intake 600 ml  Output 1700 ml  Net -1100 ml   Filed Weights   06/25/17 1321  Weight: 65.8 kg (145 lb)    Examination:  Unchanged from yesterday.   General exam: Appears calm and comfortable  Respiratory system: Clear to auscultation. Respiratory effort normal. Cardiovascular system: S1 & S2 heard, RRR. No JVD, murmurs, No pedal edema. Gastrointestinal system:  Abd is soft NT nd bs+ Central nervous system: Alert , and oriented to place and person.  Extremities: no pedal edema  Skin: No rashes, lesions or ulcers Psychiatry:  Mood good.     Data Reviewed: I have personally reviewed following labs and imaging studies  CBC: No results for input(s): WBC, NEUTROABS, HGB, HCT, MCV, PLT in the last 168 hours. Basic Metabolic Panel: No results for input(s): NA, K, CL, CO2, GLUCOSE, BUN, CREATININE, CALCIUM, MG, PHOS in the last 168 hours. GFR: Estimated Creatinine Clearance: 82.3 mL/min (A) (by C-G formula based on SCr of 0.56 mg/dL (L)). Liver Function Tests: No results for input(s): AST, ALT, ALKPHOS, BILITOT, PROT, ALBUMIN in the last 168 hours. No  results for input(s): LIPASE, AMYLASE in the last 168 hours. No results for input(s): AMMONIA in the last 168 hours. Coagulation Profile: No results for input(s): INR, PROTIME in the last 168 hours. Cardiac Enzymes: No results for input(s): CKTOTAL, CKMB, CKMBINDEX, TROPONINI in the last 168 hours. BNP (last 3 results) No results for input(s): PROBNP in the last 8760 hours. HbA1C: No results for input(s): HGBA1C in the last 72  hours. CBG: No results for input(s): GLUCAP in the last 168 hours. Lipid Profile: No results for input(s): CHOL, HDL, LDLCALC, TRIG, CHOLHDL, LDLDIRECT in the last 72 hours. Thyroid Function Tests: No results for input(s): TSH, T4TOTAL, FREET4, T3FREE, THYROIDAB in the last 72 hours. Anemia Panel: No results for input(s): VITAMINB12, FOLATE, FERRITIN, TIBC, IRON, RETICCTPCT in the last 72 hours. Sepsis Labs: No results for input(s): PROCALCITON, LATICACIDVEN in the last 168 hours.  No results found for this or any previous visit (from the past 240 hour(s)).       Radiology Studies: No results found.      Scheduled Meds: . aspirin EC  81 mg Oral Daily  . cholecalciferol  1,000 Units Oral Daily  . citalopram  10 mg Oral Daily  . divalproex  250 mg Oral Daily  . divalproex  500 mg Oral QHS  . enoxaparin (LOVENOX) injection  40 mg Subcutaneous Q24H  . finasteride  5 mg Oral Daily  . haloperidol  2 mg Oral TID  . hydrALAZINE  5 mg Intravenous Once  . lisinopril  20 mg Oral Daily  . nicotine  14 mg Transdermal Daily  . tamsulosin  0.4 mg Oral QPC supper  . traZODone  50 mg Oral QHS   Continuous Infusions:   LOS: 29 days    Time spent: 15 minutes    Hosie Poisson, MD [facility] Pager [phone]  If 7PM-7AM, please contact night-coverage [email or web address] Password [email or web address] 07/24/2017, 10:10 AM

## 2017-07-24 NOTE — Clinical Social Work Placement (Addendum)
HTA insurance authorization. Called into [place]. Pending. For PT approval.  CSW discussed case w/ CSW assistant for 30 day LOG only, pending medicaid authorization for LTC.  LOG form completed  Patient spouse completed paperwork.  3:30 [facility] arranged for transport.    CLINICAL SOCIAL WORK PLACEMENT  NOTE  Date:  07/24/2017  Patient Details  Name: Joe Dawson MRN: [id number] Date of Birth: 22-Apr-1951  Clinical Social Work is seeking post-discharge placement for this patient at the [place] level of care (*CSW will initial, date and re-position this form in  chart as items are completed):  Yes   Patient/family provided with [facility] Work Department's list of facilities offering this level of care within the geographic area requested by the patient (or if unable, by the patient's family).  Yes   Patient/family informed of their freedom to choose among providers that offer the needed level of care, that participate in Medicare, Medicaid or managed care program needed by the patient, have an available bed and are willing to accept the patient.  Yes   Patient/family informed of [facility]'s ownership interest in [facility] and [facility], as well as of the fact that they are under no obligation to receive care at these facilities.  PASRR submitted to [employer]DS on 07/03/17     PASRR number received on 07/10/17     Existing PASRR number confirmed on       FL2 transmitted to all facilities in geographic area requested by pt/family on       FL2 transmitted to all facilities within larger geographic area on 07/03/17     Patient informed that his/her managed care company has contracts with or will negotiate with certain facilities, including the following:  [facility]         Patient/family informed of bed offers received.  Patient chooses bed at [facility]     Physician recommends and patient chooses bed at      Patient  to be transferred to [facility] on 07/24/17.  Patient to be transferred to facility by [facility]      Patient family notified on 07/24/17 of transfer.  Name of family member notified:  [place]       Additional Comment:    _______________________________________________ Lia Hopping, LCSW 07/24/2017, 11:51 AM

## 2017-07-25 DIAGNOSIS — G309 Alzheimer's disease, unspecified: Secondary | ICD-10-CM | POA: Diagnosis not present

## 2017-07-25 DIAGNOSIS — N400 Enlarged prostate without lower urinary tract symptoms: Secondary | ICD-10-CM | POA: Diagnosis not present

## 2017-07-25 DIAGNOSIS — G4700 Insomnia, unspecified: Secondary | ICD-10-CM | POA: Diagnosis not present

## 2017-07-25 DIAGNOSIS — R269 Unspecified abnormalities of gait and mobility: Secondary | ICD-10-CM | POA: Diagnosis not present

## 2017-07-25 DIAGNOSIS — I10 Essential (primary) hypertension: Secondary | ICD-10-CM | POA: Diagnosis not present

## 2017-07-25 DIAGNOSIS — M6281 Muscle weakness (generalized): Secondary | ICD-10-CM | POA: Diagnosis not present

## 2017-07-25 DIAGNOSIS — G894 Chronic pain syndrome: Secondary | ICD-10-CM | POA: Diagnosis not present

## 2017-07-25 DIAGNOSIS — R293 Abnormal posture: Secondary | ICD-10-CM | POA: Diagnosis not present

## 2017-07-25 DIAGNOSIS — F0391 Unspecified dementia with behavioral disturbance: Secondary | ICD-10-CM | POA: Diagnosis not present

## 2017-07-25 DIAGNOSIS — I639 Cerebral infarction, unspecified: Secondary | ICD-10-CM | POA: Diagnosis not present

## 2017-07-25 DIAGNOSIS — N390 Urinary tract infection, site not specified: Secondary | ICD-10-CM | POA: Diagnosis not present

## 2017-07-25 DIAGNOSIS — E559 Vitamin D deficiency, unspecified: Secondary | ICD-10-CM | POA: Diagnosis not present

## 2017-07-25 DIAGNOSIS — R41841 Cognitive communication deficit: Secondary | ICD-10-CM | POA: Diagnosis not present

## 2017-07-25 DIAGNOSIS — Z741 Need for assistance with personal care: Secondary | ICD-10-CM | POA: Diagnosis not present

## 2017-07-27 DIAGNOSIS — H548 Legal blindness, as defined in USA: Secondary | ICD-10-CM | POA: Diagnosis not present

## 2017-07-27 DIAGNOSIS — H9193 Unspecified hearing loss, bilateral: Secondary | ICD-10-CM | POA: Diagnosis not present

## 2017-07-27 DIAGNOSIS — M150 Primary generalized (osteo)arthritis: Secondary | ICD-10-CM | POA: Diagnosis not present

## 2017-07-27 DIAGNOSIS — E559 Vitamin D deficiency, unspecified: Secondary | ICD-10-CM | POA: Diagnosis not present

## 2017-07-27 DIAGNOSIS — N401 Enlarged prostate with lower urinary tract symptoms: Secondary | ICD-10-CM | POA: Diagnosis not present

## 2017-07-27 DIAGNOSIS — I10 Essential (primary) hypertension: Secondary | ICD-10-CM | POA: Diagnosis not present

## 2017-07-27 DIAGNOSIS — M6281 Muscle weakness (generalized): Secondary | ICD-10-CM | POA: Diagnosis not present

## 2017-07-27 DIAGNOSIS — F0391 Unspecified dementia with behavioral disturbance: Secondary | ICD-10-CM | POA: Diagnosis not present

## 2017-07-27 DIAGNOSIS — Z741 Need for assistance with personal care: Secondary | ICD-10-CM | POA: Diagnosis not present

## 2017-07-27 DIAGNOSIS — I6930 Unspecified sequelae of cerebral infarction: Secondary | ICD-10-CM | POA: Diagnosis not present

## 2017-07-27 DIAGNOSIS — R2689 Other abnormalities of gait and mobility: Secondary | ICD-10-CM | POA: Diagnosis not present

## 2017-07-27 DIAGNOSIS — G4700 Insomnia, unspecified: Secondary | ICD-10-CM | POA: Diagnosis not present

## 2017-08-03 DIAGNOSIS — I10 Essential (primary) hypertension: Secondary | ICD-10-CM | POA: Diagnosis not present

## 2017-08-03 DIAGNOSIS — G4700 Insomnia, unspecified: Secondary | ICD-10-CM | POA: Diagnosis not present

## 2017-08-03 DIAGNOSIS — G894 Chronic pain syndrome: Secondary | ICD-10-CM | POA: Diagnosis not present

## 2017-08-03 DIAGNOSIS — N401 Enlarged prostate with lower urinary tract symptoms: Secondary | ICD-10-CM | POA: Diagnosis not present

## 2017-08-03 DIAGNOSIS — H9193 Unspecified hearing loss, bilateral: Secondary | ICD-10-CM | POA: Diagnosis not present

## 2017-08-03 DIAGNOSIS — I6930 Unspecified sequelae of cerebral infarction: Secondary | ICD-10-CM | POA: Diagnosis not present

## 2017-08-03 DIAGNOSIS — M150 Primary generalized (osteo)arthritis: Secondary | ICD-10-CM | POA: Diagnosis not present

## 2017-08-03 DIAGNOSIS — E559 Vitamin D deficiency, unspecified: Secondary | ICD-10-CM | POA: Diagnosis not present

## 2017-08-03 DIAGNOSIS — H548 Legal blindness, as defined in USA: Secondary | ICD-10-CM | POA: Diagnosis not present

## 2017-08-03 DIAGNOSIS — H6123 Impacted cerumen, bilateral: Secondary | ICD-10-CM | POA: Diagnosis not present

## 2017-08-03 DIAGNOSIS — M6281 Muscle weakness (generalized): Secondary | ICD-10-CM | POA: Diagnosis not present

## 2017-08-10 DIAGNOSIS — F0151 Vascular dementia with behavioral disturbance: Secondary | ICD-10-CM | POA: Diagnosis not present

## 2017-08-15 DIAGNOSIS — R5383 Other fatigue: Secondary | ICD-10-CM | POA: Diagnosis not present

## 2017-08-15 DIAGNOSIS — R5381 Other malaise: Secondary | ICD-10-CM | POA: Diagnosis not present

## 2017-08-16 ENCOUNTER — Inpatient Hospital Stay (HOSPITAL_COMMUNITY)
Admission: EM | Admit: 2017-08-16 | Discharge: 2017-08-19 | DRG: 193 | Disposition: A | Discharge status: Home Health | Payer: PPO | Attending: Internal Medicine | Admitting: Internal Medicine

## 2017-08-16 ENCOUNTER — Emergency Department (HOSPITAL_COMMUNITY): Payer: PPO

## 2017-08-16 ENCOUNTER — Other Ambulatory Visit: Payer: Self-pay

## 2017-08-16 ENCOUNTER — Encounter (HOSPITAL_COMMUNITY): Payer: Self-pay | Admitting: *Deleted

## 2017-08-16 DIAGNOSIS — F0391 Unspecified dementia with behavioral disturbance: Secondary | ICD-10-CM | POA: Diagnosis not present

## 2017-08-16 DIAGNOSIS — B961 Klebsiella pneumoniae [K. pneumoniae] as the cause of diseases classified elsewhere: Secondary | ICD-10-CM | POA: Diagnosis present

## 2017-08-16 DIAGNOSIS — R7881 Bacteremia: Secondary | ICD-10-CM | POA: Diagnosis present

## 2017-08-16 DIAGNOSIS — B9689 Other specified bacterial agents as the cause of diseases classified elsewhere: Secondary | ICD-10-CM | POA: Diagnosis not present

## 2017-08-16 DIAGNOSIS — Y95 Nosocomial condition: Secondary | ICD-10-CM | POA: Diagnosis present

## 2017-08-16 DIAGNOSIS — E785 Hyperlipidemia, unspecified: Secondary | ICD-10-CM | POA: Diagnosis present

## 2017-08-16 DIAGNOSIS — Z87891 Personal history of nicotine dependence: Secondary | ICD-10-CM

## 2017-08-16 DIAGNOSIS — E860 Dehydration: Secondary | ICD-10-CM | POA: Diagnosis not present

## 2017-08-16 DIAGNOSIS — G9341 Metabolic encephalopathy: Secondary | ICD-10-CM | POA: Diagnosis present

## 2017-08-16 DIAGNOSIS — E876 Hypokalemia: Secondary | ICD-10-CM | POA: Diagnosis not present

## 2017-08-16 DIAGNOSIS — I10 Essential (primary) hypertension: Secondary | ICD-10-CM | POA: Diagnosis present

## 2017-08-16 DIAGNOSIS — Z66 Do not resuscitate: Secondary | ICD-10-CM | POA: Diagnosis present

## 2017-08-16 DIAGNOSIS — J189 Pneumonia, unspecified organism: Principal | ICD-10-CM | POA: Diagnosis present

## 2017-08-16 DIAGNOSIS — R531 Weakness: Secondary | ICD-10-CM | POA: Diagnosis not present

## 2017-08-16 DIAGNOSIS — N400 Enlarged prostate without lower urinary tract symptoms: Secondary | ICD-10-CM | POA: Diagnosis present

## 2017-08-16 DIAGNOSIS — F03918 Unspecified dementia, unspecified severity, with other behavioral disturbance: Secondary | ICD-10-CM | POA: Diagnosis present

## 2017-08-16 DIAGNOSIS — J168 Pneumonia due to other specified infectious organisms: Secondary | ICD-10-CM | POA: Diagnosis not present

## 2017-08-16 DIAGNOSIS — Z1611 Resistance to penicillins: Secondary | ICD-10-CM | POA: Diagnosis present

## 2017-08-16 DIAGNOSIS — R0602 Shortness of breath: Secondary | ICD-10-CM | POA: Diagnosis not present

## 2017-08-16 DIAGNOSIS — R05 Cough: Secondary | ICD-10-CM | POA: Diagnosis not present

## 2017-08-16 DIAGNOSIS — Z888 Allergy status to other drugs, medicaments and biological substances status: Secondary | ICD-10-CM

## 2017-08-16 DIAGNOSIS — Z7982 Long term (current) use of aspirin: Secondary | ICD-10-CM | POA: Diagnosis not present

## 2017-08-16 DIAGNOSIS — R402441 Other coma, without documented Glasgow coma scale score, or with partial score reported, in the field [EMT or ambulance]: Secondary | ICD-10-CM | POA: Diagnosis not present

## 2017-08-16 DIAGNOSIS — H547 Unspecified visual loss: Secondary | ICD-10-CM

## 2017-08-16 DIAGNOSIS — R5383 Other fatigue: Secondary | ICD-10-CM | POA: Diagnosis not present

## 2017-08-16 DIAGNOSIS — Z79899 Other long term (current) drug therapy: Secondary | ICD-10-CM | POA: Diagnosis not present

## 2017-08-16 DIAGNOSIS — Z720 Tobacco use: Secondary | ICD-10-CM | POA: Diagnosis present

## 2017-08-16 LAB — CBC WITH DIFFERENTIAL/PLATELET
Basophils Absolute: 0 10*3/uL (ref 0.0–0.1)
Basophils Relative: 0 %
Eosinophils Absolute: 0 10*3/uL (ref 0.0–0.7)
Eosinophils Relative: 0 %
HEMATOCRIT: 35.9 % — AB (ref 39.0–52.0)
HEMOGLOBIN: 12.4 g/dL — AB (ref 13.0–17.0)
LYMPHS ABS: 0.8 10*3/uL (ref 0.7–4.0)
Lymphocytes Relative: 4 %
MCH: 33.2 pg (ref 26.0–34.0)
MCHC: 34.5 g/dL (ref 30.0–36.0)
MCV: 96.2 fL (ref 78.0–100.0)
MONO ABS: 1.5 10*3/uL — AB (ref 0.1–1.0)
MONOS PCT: 7 %
NEUTROS ABS: 18.5 10*3/uL — AB (ref 1.7–7.7)
NEUTROS PCT: 89 %
Platelets: 282 10*3/uL (ref 150–400)
RBC: 3.73 MIL/uL — ABNORMAL LOW (ref 4.22–5.81)
RDW: 14.6 % (ref 11.5–15.5)
WBC: 20.9 10*3/uL — ABNORMAL HIGH (ref 4.0–10.5)

## 2017-08-16 LAB — I-STAT TROPONIN, ED: Troponin i, poc: 0.01 ng/mL (ref 0.00–0.08)

## 2017-08-16 LAB — COMPREHENSIVE METABOLIC PANEL
ALBUMIN: 2.9 g/dL — AB (ref 3.5–5.0)
ALT: 19 U/L (ref 17–63)
AST: 18 U/L (ref 15–41)
Alkaline Phosphatase: 63 U/L (ref 38–126)
Anion gap: 11 (ref 5–15)
BUN: 21 mg/dL — ABNORMAL HIGH (ref 6–20)
CHLORIDE: 103 mmol/L (ref 101–111)
CO2: 26 mmol/L (ref 22–32)
CREATININE: 0.6 mg/dL — AB (ref 0.61–1.24)
Calcium: 9 mg/dL (ref 8.9–10.3)
GFR calc non Af Amer: >60 mL/min (ref >60)
GLUCOSE: 122 mg/dL — AB (ref 65–99)
Potassium: 3.1 mmol/L — ABNORMAL LOW (ref 3.5–5.1)
SODIUM: 140 mmol/L (ref 135–145)
Total Bilirubin: 0.9 mg/dL (ref 0.3–1.2)
Total Protein: 7.1 g/dL (ref 6.5–8.1)

## 2017-08-16 LAB — MAGNESIUM: MAGNESIUM: 1.9 mg/dL (ref 1.7–2.4)

## 2017-08-16 LAB — I-STAT CG4 LACTIC ACID, ED: LACTIC ACID, VENOUS: 0.76 mmol/L (ref 0.5–1.9)

## 2017-08-16 LAB — PHOSPHORUS: Phosphorus: 2.4 mg/dL — ABNORMAL LOW (ref 2.5–4.6)

## 2017-08-16 MED ORDER — SERTRALINE HCL 50 MG PO TABS
50.0000 mg | ORAL_TABLET | Freq: Every day | ORAL | Status: DC
  Administered 2017-08-17 – 2017-08-19 (×3): 50 mg via ORAL
  Filled 2017-08-16 (×3): qty 1

## 2017-08-16 MED ORDER — VANCOMYCIN HCL IN DEXTROSE 1-5 GM/200ML-% IV SOLN
1000.0000 mg | Freq: Once | INTRAVENOUS | Status: AC
  Administered 2017-08-16: 1000 mg via INTRAVENOUS
  Filled 2017-08-16: qty 200

## 2017-08-16 MED ORDER — SODIUM CHLORIDE 0.9 % IV SOLN
1.0000 g | Freq: Three times a day (TID) | INTRAVENOUS | Status: DC
  Administered 2017-08-16 – 2017-08-17 (×2): 1 g via INTRAVENOUS
  Filled 2017-08-16 (×4): qty 1

## 2017-08-16 MED ORDER — SODIUM CHLORIDE 0.9 % IV SOLN
INTRAVENOUS | Status: AC
  Administered 2017-08-16: 23:00:00 via INTRAVENOUS

## 2017-08-16 MED ORDER — ASPIRIN EC 81 MG PO TBEC
81.0000 mg | DELAYED_RELEASE_TABLET | Freq: Every day | ORAL | Status: DC
  Administered 2017-08-17 – 2017-08-19 (×3): 81 mg via ORAL
  Filled 2017-08-16 (×3): qty 1

## 2017-08-16 MED ORDER — HYDROCODONE-ACETAMINOPHEN 5-325 MG PO TABS: 1.0000 | ORAL_TABLET | ORAL | Status: DC | PRN

## 2017-08-16 MED ORDER — SODIUM CHLORIDE 0.9 % IV BOLUS
1000.0000 mL | Freq: Once | INTRAVENOUS | Status: AC
  Administered 2017-08-16: 1000 mL via INTRAVENOUS

## 2017-08-16 MED ORDER — GUAIFENESIN ER 600 MG PO TB12
600.0000 mg | ORAL_TABLET | Freq: Two times a day (BID) | ORAL | Status: DC
  Administered 2017-08-16 – 2017-08-19 (×6): 600 mg via ORAL
  Filled 2017-08-16 (×6): qty 1

## 2017-08-16 MED ORDER — TAMSULOSIN HCL 0.4 MG PO CAPS
0.4000 mg | ORAL_CAPSULE | Freq: Every day | ORAL | Status: DC
  Administered 2017-08-17: 0.4 mg via ORAL
  Filled 2017-08-16 (×2): qty 1

## 2017-08-16 MED ORDER — LORAZEPAM 0.5 MG PO TABS: 0.5000 mg | ORAL_TABLET | Freq: Three times a day (TID) | ORAL | Status: DC | PRN

## 2017-08-16 MED ORDER — FINASTERIDE 5 MG PO TABS
5.0000 mg | ORAL_TABLET | Freq: Every day | ORAL | Status: DC
  Administered 2017-08-17 – 2017-08-19 (×3): 5 mg via ORAL
  Filled 2017-08-16 (×3): qty 1

## 2017-08-16 MED ORDER — LISINOPRIL 20 MG PO TABS
20.0000 mg | ORAL_TABLET | Freq: Every day | ORAL | Status: DC
  Administered 2017-08-17 – 2017-08-19 (×3): 20 mg via ORAL
  Filled 2017-08-16 (×3): qty 1

## 2017-08-16 MED ORDER — POTASSIUM CHLORIDE 10 MEQ/100ML IV SOLN
10.0000 meq | INTRAVENOUS | Status: AC
  Administered 2017-08-16 – 2017-08-17 (×4): 10 meq via INTRAVENOUS
  Filled 2017-08-16 (×4): qty 100

## 2017-08-16 MED ORDER — ENOXAPARIN SODIUM 40 MG/0.4ML ~~LOC~~ SOLN
40.0000 mg | SUBCUTANEOUS | Status: DC
  Administered 2017-08-16 – 2017-08-18 (×3): 40 mg via SUBCUTANEOUS
  Filled 2017-08-16 (×3): qty 0.4

## 2017-08-16 MED ORDER — HALOPERIDOL LACTATE 2 MG/ML PO CONC
2.0000 mg | Freq: Three times a day (TID) | ORAL | Status: DC
  Administered 2017-08-16 – 2017-08-19 (×7): 2 mg via ORAL
  Filled 2017-08-16 (×9): qty 1

## 2017-08-16 MED ORDER — TRAZODONE HCL 50 MG PO TABS
50.0000 mg | ORAL_TABLET | Freq: Every day | ORAL | Status: DC
  Administered 2017-08-16 – 2017-08-18 (×3): 50 mg via ORAL
  Filled 2017-08-16 (×3): qty 1

## 2017-08-16 MED ORDER — ONDANSETRON HCL 4 MG/2ML IJ SOLN: 4.0000 mg | Freq: Four times a day (QID) | INTRAMUSCULAR | Status: DC | PRN

## 2017-08-16 MED ORDER — PIPERACILLIN-TAZOBACTAM 3.375 G IVPB 30 MIN
3.3750 g | Freq: Once | INTRAVENOUS | Status: AC
  Administered 2017-08-16: 3.375 g via INTRAVENOUS
  Filled 2017-08-16: qty 50

## 2017-08-16 MED ORDER — ONDANSETRON HCL 4 MG PO TABS: 4.0000 mg | ORAL_TABLET | Freq: Four times a day (QID) | ORAL | Status: DC | PRN

## 2017-08-16 MED ORDER — ACETAMINOPHEN 325 MG PO TABS: 650.0000 mg | ORAL_TABLET | Freq: Four times a day (QID) | ORAL | Status: DC | PRN

## 2017-08-16 MED ORDER — ACETAMINOPHEN 650 MG RE SUPP: 650.0000 mg | Freq: Four times a day (QID) | RECTAL | Status: DC | PRN

## 2017-08-16 MED ORDER — ENSURE ENLIVE PO LIQD
237.0000 mL | Freq: Two times a day (BID) | ORAL | Status: DC
  Administered 2017-08-17 – 2017-08-19 (×5): 237 mL via ORAL

## 2017-08-16 MED ORDER — POTASSIUM CHLORIDE CRYS ER 20 MEQ PO TBCR
20.0000 meq | EXTENDED_RELEASE_TABLET | Freq: Once | ORAL | Status: AC
  Administered 2017-08-16: 20 meq via ORAL
  Filled 2017-08-16: qty 1

## 2017-08-16 MED ORDER — SODIUM CHLORIDE 0.9 % IV SOLN
1.0000 g | Freq: Three times a day (TID) | INTRAVENOUS | Status: DC
  Filled 2017-08-16 (×2): qty 1

## 2017-08-16 MED ORDER — K PHOS MONO-SOD PHOS DI & MONO 155-852-130 MG PO TABS
250.0000 mg | ORAL_TABLET | Freq: Every day | ORAL | Status: DC
  Administered 2017-08-16 – 2017-08-17 (×2): 250 mg via ORAL
  Filled 2017-08-16 (×2): qty 1

## 2017-08-16 MED ORDER — SODIUM CHLORIDE 0.9 % IV SOLN
1250.0000 mg | INTRAVENOUS | Status: DC
  Filled 2017-08-16: qty 1250

## 2017-08-16 MED ORDER — ALBUTEROL SULFATE (2.5 MG/3ML) 0.083% IN NEBU: 2.5000 mg | INHALATION_SOLUTION | RESPIRATORY_TRACT | Status: DC | PRN

## 2017-08-16 NOTE — ED Notes (Signed)
ED TO INPATIENT HANDOFF REPORT  Name/Age/Gender Joe Dawson 67 y.o. male  Code Status Code Status History    Date Active Date Inactive Code Status Order ID Comments User Context   06/25/2017 1231 07/24/2017 2145 DNR [id number]  [name], MD ED   05/28/2017 0256 05/31/2017 2349 Full Code [id number]  [name], MD ED    Questions for Most Recent Historical Code Status (Order [id number])    Question Answer Comment   In the event of cardiac or respiratory ARREST Do not call a "code blue"    In the event of cardiac or respiratory ARREST Do not perform Intubation, CPR, defibrillation or ACLS    In the event of cardiac or respiratory ARREST Use medication by any route, position, wound care, and other measures to relive pain and suffering. May use oxygen, suction and manual treatment of airway obstruction as needed for comfort.         Advance Directive Documentation     Most Recent Value  Type of Advance Directive  Healthcare Power of Attorney  Pre-existing out of facility DNR order (yellow form or pink MOST form)  -  "MOST" Form in Place?  -      Home/SNF/Other Home  Chief Complaint Pneumonia  Level of Care/Admitting Diagnosis ED Disposition    ED Disposition Condition [place]: [place] [[id number]]  Level of Care: Telemetry [5]  Admit to tele based on following criteria: Other see comments  Comments: hypokalemia  Diagnosis: HCAP (healthcare-associated pneumonia) [244010]  Admitting Physician: Toy Baker [[id number]]  Attending Physician: Toy Baker [[id number]]  Estimated length of stay: 3 - 4 days  Certification:: I certify this patient will need inpatient services for at least 2 midnights  PT Class (Do Not Modify): Inpatient [101]  PT Acc Code (Do Not Modify): Private [1]       Medical History Past Medical History:  Diagnosis Date  . Arthritis   . Blindness - both eyes   . BPH (benign prostatic hyperplasia)    . Hyperlipidemia   . Hypertension   . Tobacco abuse     Allergies Allergies  Allergen Reactions  . Epinephrine     Legs shake    IV Location/Drains/Wounds Patient Lines/Drains/Airways Status   Active Line/Drains/Airways    Name:   Placement date:   Placement time:   Site:   Days:   External Urinary Catheter   07/03/17    0856    -   44          Labs/Imaging Results for orders placed or performed during the hospital encounter of 08/16/17 (from the past 48 hour(s))  Comprehensive metabolic panel     Status: Abnormal   Collection Time: 08/16/17  6:10 PM  Result Value Ref Range   Sodium 140 135 - 145 mmol/L   Potassium 3.1 (L) 3.5 - 5.1 mmol/L   Chloride 103 101 - 111 mmol/L   CO2 26 22 - 32 mmol/L   Glucose, Bld 122 (H) 65 - 99 mg/dL   BUN 21 (H) 6 - 20 mg/dL   Creatinine, Ser 0.60 (L) 0.61 - 1.24 mg/dL   Calcium 9.0 8.9 - 10.3 mg/dL   Total Protein 7.1 6.5 - 8.1 g/dL   Albumin 2.9 (L) 3.5 - 5.0 g/dL   AST 18 15 - 41 U/L   ALT 19 17 - 63 U/L   Alkaline Phosphatase 63 38 - 126 U/L   Total Bilirubin 0.9  0.3 - 1.2 mg/dL   GFR calc non Af Amer >60 >60 mL/min   GFR calc Af Amer >60 >60 mL/min    Comment: (NOTE) The eGFR has been calculated using the CKD EPI equation. This calculation has not been validated in all clinical situations. eGFR's persistently <60 mL/min signify possible Chronic Kidney Disease.    Anion gap 11 5 - 15    Comment: Performed at [facility], [place] [address], [place], [place] [address]  CBC WITH DIFFERENTIAL     Status: Abnormal   Collection Time: 08/16/17  6:10 PM  Result Value Ref Range   WBC 20.9 (H) 4.0 - 10.5 K/uL   RBC 3.73 (L) 4.22 - 5.81 MIL/uL   Hemoglobin 12.4 (L) 13.0 - 17.0 g/dL   HCT 35.9 (L) 39.0 - 52.0 %   MCV 96.2 78.0 - 100.0 fL   MCH 33.2 26.0 - 34.0 pg   MCHC 34.5 30.0 - 36.0 g/dL   RDW 14.6 11.5 - 15.5 %   Platelets 282 150 - 400 K/uL   Neutrophils Relative % 89 %   Neutro Abs 18.5 (H) 1.7 - 7.7  K/uL   Lymphocytes Relative 4 %   Lymphs Abs 0.8 0.7 - 4.0 K/uL   Monocytes Relative 7 %   Monocytes Absolute 1.5 (H) 0.1 - 1.0 K/uL   Eosinophils Relative 0 %   Eosinophils Absolute 0.0 0.0 - 0.7 K/uL   Basophils Relative 0 %   Basophils Absolute 0.0 0.0 - 0.1 K/uL    Comment: Performed at [facility], [place] [address], [place], [place] [address]  I-stat troponin, ED (not at [facility])     Status: None   Collection Time: 08/16/17  6:11 PM  Result Value Ref Range   Troponin i, poc 0.01 0.00 - 0.08 ng/mL   Comment 3            Comment: Due to the release kinetics of cTnI, a negative result within the first hours of the onset of symptoms does not rule out myocardial infarction with certainty. If myocardial infarction is still suspected, repeat the test at appropriate intervals.   I-Stat CG4 Lactic Acid, ED  (not at  [facility])     Status: None   Collection Time: 08/16/17  6:13 PM  Result Value Ref Range   Lactic Acid, Venous 0.76 0.5 - 1.9 mmol/L  Magnesium     Status: None   Collection Time: 08/16/17  6:17 PM  Result Value Ref Range   Magnesium 1.9 1.7 - 2.4 mg/dL    Comment: Performed at [facility], [place] [address] [place], [place] [address]  Phosphorus     Status: Abnormal   Collection Time: 08/16/17  6:17 PM  Result Value Ref Range   Phosphorus 2.4 (L) 2.5 - 4.6 mg/dL    Comment: Performed at [facility], [place] [address] [place],  [address]   Dg Chest 2 View  Result Date: 08/16/2017 CLINICAL DATA:  Increased lethargy.  Cough.  Pneumonia. EXAM: CHEST - 2 VIEW COMPARISON:  Two-view chest x-ray 05/28/2017 FINDINGS: The heart size is normal. Atherosclerotic calcifications are present at the aortic arch. Ill-defined right middle lobe airspace disease is present. Posterior right lower lobe airspace disease is noted as well. No significant left-sided disease is present. Degenerative changes in the thoracolumbar spine and  right greater than left shoulders are again noted. IMPRESSION: 1. Right middle and lower lobe pneumonia. 2. Atherosclerosis. 3. Degenerative  changes of the thoracolumbar spine and right shoulder. Electronically Signed   By: San Morelle M.D.   On: 08/16/2017 19:17    Pending Labs Unresulted Labs (From admission, onward)   Start     Ordered   08/16/17 1738  Blood Culture (routine x 2)  BLOOD CULTURE X 2,   STAT     08/16/17 1738   08/16/17 1738  Urinalysis, Routine w reflex microscopic  STAT,   STAT     08/16/17 1738   Signed and Held  Culture, sputum-assessment  Once,   R    Question:  Patient immune status  Answer:  Normal   Signed and Held   Signed and Held  Gram stain  Once,   R    Question:  Patient immune status  Answer:  Normal   Signed and Held   Signed and Held  Strep pneumoniae urinary antigen  Once,   R     Signed and Held   Signed and Held  Magnesium  Tomorrow morning,   R    Comments:  Call MD if <1.5    Signed and Held   Signed and Held  Phosphorus  Tomorrow morning,   R     Signed and Held   Signed and Held  TSH  Once,   R    Comments:  Cancel if already done within 1 month and notify MD    Signed and Held   Signed and Held  Comprehensive metabolic panel  Once,   R    Comments:  Cal MD for K<3.5 or >5.0    Signed and Held   Signed and Held  CBC  Once,   R    Comments:  Call for hg <8.0    Signed and Held      Vitals/Pain Today's Vitals   08/16/17 1724 08/16/17 1726 08/16/17 1937 08/16/17 1941  BP:   (!) 139/96   Pulse:   71   Resp:   18   Temp:      TempSrc:      SpO2:   97%   Weight:  135 lb (61.2 kg)  127 lb (57.6 kg)  Height:  _0  (1.702 m)    PainSc: 0-No pain       Isolation Precautions No active isolations  Medications Medications  vancomycin (VANCOCIN) IVPB 1000 mg/200 mL premix (1,000 mg Intravenous New Bag/Given 08/16/17 2030)  potassium chloride 10 mEq in 100 mL IVPB (10 mEq Intravenous New Bag/Given 08/16/17 2033)   phosphorus (K PHOS NEUTRAL) tablet 250 mg (has no administration in time range)  sodium chloride 0.9 % bolus 1,000 mL (0 mLs Intravenous Stopped 08/16/17 1912)  piperacillin-tazobactam (ZOSYN) IVPB 3.375 g (0 g Intravenous Stopped 08/16/17 2012)  potassium chloride SA (K-DUR,KLOR-CON) CR tablet 20 mEq (20 mEq Oral Given 08/16/17 1942)    Mobility non-ambulatory

## 2017-08-16 NOTE — Progress Notes (Signed)
Pharmacy Antibiotic Note  Joe Dawson is a 67 y.o. male admitted on 08/16/2017 with pneumonia.  Pharmacy has been consulted for vancomycin dosing. Zosyn 3.375 given at [place], vancomycin 1 gm given at 2030.  WBC 20.9, Cr 0.6, AF. CXR RML PNA.  He has been on augmentin for PNA since the 14th.      Plan: Cefepime 1 gm q8 per MD Vancomycin 1 gm given in ED at 2030, then vancomycin 1250 mg IV q24 for est AUC 520 using SCr 0.8, TBW/TBW. Next dose 3/28 at 1600 2nd no loading dose F/u renal function, WBC, temp, culture data Vancomycin levels as needed  Height: 5\' 7"  (170.2 cm) Weight: 127 lb (57.6 kg) IBW/kg (Calculated) : 66.1  Temp (24hrs), Avg:98.5 F (36.9 C), Min:98.5 F (36.9 C), Max:98.5 F (36.9 C)  Recent Labs  Lab 08/16/17 1810 08/16/17 1813  WBC 20.9*  --   CREATININE 0.60*  --   LATICACIDVEN  --  0.76    Estimated Creatinine Clearance: 72 mL/min (A) (by C-G formula based on SCr of 0.6 mg/dL (L)).    Allergies  Allergen Reactions  . Epinephrine     Legs shake    Antimicrobials this admission: 3/27 zosyn x 1 3/27 vanc>> 3/27 cefepime>> Dose adjustments this admission:   Microbiology results: 3/27 BCx2>>  Thank you for allowing pharmacy to be a part of this patient's care.  Eudelia Bunch, Pharm.D. [phone] 08/16/2017 10:06 PM

## 2017-08-16 NOTE — H&P (Signed)
Joe Dawson VPX:[id number] DOB: [birth year] DOA: 08/16/2017     PCP: Gayland Curry, DO   Outpatient Specialists: NONE   Patient arrived to ER on 08/16/17 at 1657  Patient coming from: From facility [facility] nursing facility    Chief Complaint:  Chief Complaint  Patient presents with  . Weakness    lethargy    HPI: Joe Dawson is a 67 y.o. male with medical history significant of  hypertension, severe dementia with behavioral changes, blindness and BPH      Presented with   confusion and somnolence for past 3 days Treated with Augmentin for past 1 week for possible PNA. unable to provide much hx. poorly started to become more confused and lethargic and wife requested him to be transferred to emergency department for further evaluation patient baseline has blindness and dementia. Per Report had low grade fevers at the facility. denies any chest pain. Wife reports cough productive. Denies diarrhea.   Regarding pertinent Chronic problems: Admitted in February with increased agitation and was found to have UTI treated with Rocephin for 5 days at that time psychiatry evaluated patient and adjusted his medications he was discharged to SNF memory care Patient has history of BPH on Flomax and Proscar While in ER:  CXR worrisome forPNA  Significant initial  Findings: Abnormal Labs Reviewed  COMPREHENSIVE METABOLIC PANEL - Abnormal; Notable for the following components:      Result Value   Potassium 3.1 (*)    Glucose, Bld 122 (*)    BUN 21 (*)    Creatinine, Ser 0.60 (*)    Albumin 2.9 (*)    All other components within normal limits  CBC WITH DIFFERENTIAL/PLATELET - Abnormal; Notable for the following components:   WBC 20.9 (*)    RBC 3.73 (*)    Hemoglobin 12.4 (*)    HCT 35.9 (*)    Neutro Abs 18.5 (*)    Monocytes Absolute 1.5 (*)    All other components within normal limits     Na 140 K 3.1  Cr   Stable,  Lab Results  Component Value Date   CREATININE  0.60 (L) 08/16/2017   CREATININE 0.56 (L) 07/14/2017   CREATININE 0.58 (L) 06/25/2017      HG/HCT  Down from baseline see below    Component Value Date/Time   HGB 12.4 (L) 08/16/2017 1810   HGB 14.9 11/17/2014 0837   HCT 35.9 (L) 08/16/2017 1810   HCT 42.7 11/17/2014 0837    Troponin (Point of Care Test) Recent Labs    08/16/17 1811  TROPIPOC 0.01    Lactic Acid, Venous    Component Value Date/Time   LATICACIDVEN 0.76 08/16/2017 1813      UA ordered  CXR -right middle and lower lobe pneumonia     ECG:  ordered     ED Triage Vitals  Enc Vitals Group     BP 08/16/17 1721 138/79     Pulse Rate 08/16/17 1721 68     Resp 08/16/17 1721 15     Temp 08/16/17 1721 98.5 F (36.9 C)     Temp Source 08/16/17 1721 Oral     SpO2 08/16/17 1721 98 %     Weight 08/16/17 1726 135 lb (61.2 kg)     Height 08/16/17 1726 5\' 7"  (1.702 m)     Head Circumference --      Peak Flow --      Pain Score 08/16/17 1724 0  Pain Loc --      Pain Edu? --      Excl. in [place]? --   TMAX(24)@     on arrival  ED Triage Vitals  Enc Vitals Group     BP 08/16/17 1721 138/79     Pulse Rate 08/16/17 1721 68     Resp 08/16/17 1721 15     Temp 08/16/17 1721 98.5 F (36.9 C)     Temp Source 08/16/17 1721 Oral     SpO2 08/16/17 1721 98 %     Weight 08/16/17 1726 135 lb (61.2 kg)     Height 08/16/17 1726 5\' 7"  (1.702 m)     Head Circumference --      Peak Flow --      Pain Score 08/16/17 1724 0     Pain Loc --      Pain Edu? --      Excl. in [place]? --      Latest  Blood pressure (!) 139/96, pulse 71, temperature 98.5 F (36.9 C), temperature source Oral, resp. rate 18, height 5\' 7"  (1.702 m), weight 57.6 kg (127 lb), SpO2 97 %.   Following Medications were ordered in ER: Medications  piperacillin-tazobactam (ZOSYN) IVPB 3.375 g (3.375 g Intravenous New Bag/Given 08/16/17 1942)  vancomycin (VANCOCIN) IVPB 1000 mg/200 mL premix (has no administration in time range)  sodium chloride 0.9 %  bolus 1,000 mL (0 mLs Intravenous Stopped 08/16/17 1912)  potassium chloride SA (K-DUR,KLOR-CON) CR tablet 20 mEq (20 mEq Oral Given 08/16/17 1942)     Hospitalist was called for admission for HCAP   Review of Systems:    Pertinent positives include: Fevers, chills, fatigue increased confusion  Constitutional:  No weight loss, night sweats, , weight loss  HEENT:  No headaches, Difficulty swallowing,Tooth/dental problems,Sore throat,  No sneezing, itching, ear ache, nasal congestion, post nasal drip,  Cardio-vascular:  No chest pain, Orthopnea, PND, anasarca, dizziness, palpitations.no Bilateral lower extremity swelling  GI:  No heartburn, indigestion, abdominal pain, nausea, vomiting, diarrhea, change in bowel habits, loss of appetite, melena, blood in stool, hematemesis Resp:  no shortness of breath at rest. No dyspnea on exertion, No excess mucus, no productive cough, No non-productive cough, No coughing up of blood.No change in color of mucus.No wheezing. Skin:  no rash or lesions. No jaundice GU:  no dysuria, change in color of urine, no urgency or frequency. No straining to urinate.  No flank pain.  Musculoskeletal:  No joint pain or no joint swelling. No decreased range of motion. No back pain.  Psych:  No change in mood or affect. No depression or anxiety. No memory loss.  Neuro: no localizing neurological complaints, no tingling, no weakness, no double vision, no gait abnormality, no slurred speech, no confusion  As per HPI otherwise 10 point review of systems negative.   Past Medical History:   Past Medical History:  Diagnosis Date  . Arthritis   . Blindness - both eyes   . BPH (benign prostatic hyperplasia)   . Hyperlipidemia   . Hypertension   . Tobacco abuse       Past Surgical History:  Procedure Laterality Date  . bilateral inguinal hernia  1974   Dr. Viona Gilmore  . broken foot bone    . broken hand  1998    Social History:  Ambulatory  walker       reports that he has been smoking cigarettes.  He has been smoking about 0.50 packs per day.  He has never used smokeless tobacco. He reports that he has current or past drug history. Drug: Marijuana. He reports that he does not drink alcohol.     Family History:   Family History  Problem Relation Age of Onset  . Cancer Mother        Breast  . Heart disease Father 20  . Colon cancer Neg Hx   . Stomach cancer Neg Hx     Allergies: Allergies  Allergen Reactions  . Epinephrine     Legs shake     Prior to Admission medications   Medication Sig Start Date End Date Taking? Authorizing Provider  acetaminophen (TYLENOL) 325 MG tablet Take 650 mg by mouth 2 (two) times daily.   Yes [provider]  aspirin 81 MG tablet Take 81 mg by mouth daily.    Yes [provider]  cholecalciferol (VITAMIN D) 1000 units tablet Take 1,000 Units by mouth daily.   Yes [provider]  finasteride (PROSCAR) 5 MG tablet Take 5 mg by mouth daily.   Yes [provider]  haloperidol (HALDOL) 2 MG/ML solution Take 2 mg by mouth every 8 (eight) hours. 07/25/17  Yes [provider]  lisinopril (PRINIVIL,ZESTRIL) 20 MG tablet Take 20 mg by mouth daily.   Yes [provider]  LORazepam (ATIVAN) 0.5 MG tablet Take 1 tablet (0.5 mg total) by mouth daily as needed for anxiety. Patient taking differently: Take 0.5 mg by mouth every 8 (eight) hours as needed for anxiety.  07/09/17  Yes Theodis Blaze, MD  saccharomyces boulardii (FLORASTOR) 250 MG capsule Take 250 mg by mouth daily.   Yes [provider]  sertraline (ZOLOFT) 50 MG tablet Take 50 mg by mouth daily. 07/25/17  Yes [provider]  tamsulosin (FLOMAX) 0.4 MG CAPS capsule Take 0.4 mg by mouth daily after supper.   Yes [provider]  traZODone (DESYREL) 50 MG tablet Take 50 mg by mouth at bedtime.   Yes [provider]  haloperidol (HALDOL) 2 MG tablet Take 2 mg by mouth 3  (three) times daily.    [provider]   Physical Exam: Blood pressure (!) 139/96, pulse 71, temperature 98.5 F (36.9 C), temperature source Oral, resp. rate 18, height 5\' 7"  (1.702 m), weight 57.6 kg (127 lb), SpO2 97 %. 1. General:  in No Acute distress  Chronically ill  -appearing 2. Psychological: Alert not  Oriented to self 3. Head/ENT:    Dry Mucous Membranes                          Head Non traumatic, neck supple                           Poor Dentition 4. SKIN:   decreased Skin turgor,  Skin clean Dry and intact no rash 5. Heart: Regular rate and rhythm no  Murmur, no Rub or gallop 6. Lungs:   no wheezes some crackles   7. Abdomen: Soft,  non-tender, Non distended  bowel sounds present 8. Lower extremities: no clubbing, cyanosis, or edema 9. Neurologically Grossly intact, moving all 4 extremities equally  10. MSK: Normal range of motion   LABS:     Recent Labs  Lab 08/16/17 1810  WBC 20.9*  NEUTROABS 18.5*  HGB 12.4*  HCT 35.9*  MCV 96.2  PLT 425   Basic Metabolic Panel: Recent Labs  Lab  08/16/17 1810  NA 140  K 3.1*  CL 103  CO2 26  GLUCOSE 122*  BUN 21*  CREATININE 0.60*  CALCIUM 9.0      Recent Labs  Lab 08/16/17 1810  AST 18  ALT 19  ALKPHOS 63  BILITOT 0.9  PROT 7.1  ALBUMIN 2.9*   No results for input(s): LIPASE, AMYLASE in the last 168 hours. No results for input(s): AMMONIA in the last 168 hours.    HbA1C: No results for input(s): HGBA1C in the last 72 hours. CBG: No results for input(s): GLUCAP in the last 168 hours.    Urine analysis:    Component Value Date/Time   COLORURINE YELLOW 06/25/2017 0910   APPEARANCEUR HAZY (A) 06/25/2017 0910   LABSPEC 1.012 06/25/2017 0910   PHURINE 7.0 06/25/2017 0910   GLUCOSEU NEGATIVE 06/25/2017 0910   HGBUR SMALL (A) 06/25/2017 0910   BILIRUBINUR NEGATIVE 06/25/2017 0910   KETONESUR NEGATIVE 06/25/2017 0910   PROTEINUR NEGATIVE 06/25/2017 0910   NITRITE POSITIVE (A)  06/25/2017 0910   LEUKOCYTESUR MODERATE (A) 06/25/2017 0910      Cultures:    Component Value Date/Time   SDES  06/25/2017 0910    URINE, CLEAN CATCH Performed at [facility], [place] [address] [place], [place] [address]    SPECREQUEST  06/25/2017 1660    NONE Performed at [facility], [place] [address], [place], [place] [address]    CULT >=100,000 COLONIES/mL KLEBSIELLA PNEUMONIAE (A) 06/25/2017 0910   REPTSTATUS 06/27/2017 FINAL 06/25/2017 0910     Radiological Exams on Admission: Dg Chest 2 View  Result Date: 08/16/2017 CLINICAL DATA:  Increased lethargy.  Cough.  Pneumonia. EXAM: CHEST - 2 VIEW COMPARISON:  Two-view chest x-ray 05/28/2017 FINDINGS: The heart size is normal. Atherosclerotic calcifications are present at the aortic arch. Ill-defined right middle lobe airspace disease is present. Posterior right lower lobe airspace disease is noted as well. No significant left-sided disease is present. Degenerative changes in the thoracolumbar spine and right greater than left shoulders are again noted. IMPRESSION: 1. Right middle and lower lobe pneumonia. 2. Atherosclerosis. 3. Degenerative changes of the thoracolumbar spine and right shoulder. Electronically Signed   By: San Morelle M.D.   On: 08/16/2017 19:17    Chart has been reviewed    Assessment/Plan  67 y.o. male with medical history significant of  hypertension, severe dementia with behavioral changes, blindness and BPH   Admitted for HCAP  Present on Admission: . HCAP (healthcare-associated pneumonia) -  - will admit for treatment of HCAP will start on appropriate antibiotic coverage.   Obtain:  sputum cultures,                  blood cultures if febrile or if decompensates.                   strep pneumo UA antigen,                    Provide oxygen as needed.   . Tobacco abuse no longer smoking given currently in SNF . Dementia with behavioral disturbance -expect some  decline given acute illness monitor for sundowning . Essential hypertension, benign  - stable continue home medications   . Acute metabolic encephalopathy -   - most likely multifactorial secondary to combination of  infection  mild dehydration secondary to decreased by mouth intake,    - Will rehydrate   - treat underlining infection     .  Benign prostatic hyperplasia stable we will continue home medications  hypoPhosphatemia -mild will replace Other plan as per orders.  DVT prophylaxis:   Lovenox     Code Status:   DNR/DNI   as per family   Family Communication:   Family  at  Bedside  plan of care was discussed with Wife   Disposition Plan:      To home once workup is complete and patient is stable Wife would like to take him home after discharge.                        Would benefit from PT/OT eval prior to DC  ordered                            Consults called: none  Admission status:   inpatient       Level of care     tele       I have spent a total of 56 min on this admission   Earnie Bechard 08/16/2017, 9:25 PM    [facility]  Pager [phone]   after 2 AM please page floor coverage PA If 7AM-7PM, please contact the day team taking care of the patient  [email or web address]  Password [email or web address]

## 2017-08-16 NOTE — ED Notes (Signed)
Pt is incontinent and voided in brief prior to shift change. Urinalysis unable to be collected.

## 2017-08-16 NOTE — ED Provider Notes (Signed)
[facility] DEPT Provider Note   CSN: [id number] Arrival date & time: 08/16/17  1657     History   Chief Complaint Chief Complaint  Patient presents with  . Weakness    lethargy    HPI Joe Dawson is a 67 y.o. male.  Level 5 caveat secondary to dementia.  Patient is transferred here from his nursing home facility at [facility] for increased lethargy over the last 3 days.  Per nursing here at the facility he states he had pneumonia couple weeks ago and has been on Augmentin.  The patient states he is having a little bit of difficulty breathing but denies pain.  He does not know if there is a fever.  He is not oriented to place or time.  No other history is available at present.  The history is provided by the patient and the EMS personnel.  Weakness  Primary symptoms include no focal weakness. This is a new problem. The current episode started more than 2 days ago. The problem has not changed since onset.There was no focality noted. The maximum temperature recorded prior to his arrival was 100 to 100.9 F. Fever duration: unknown. Associated symptoms include shortness of breath and altered mental status.    Past Medical History:  Diagnosis Date  . Arthritis   . Blindness - both eyes   . BPH (benign prostatic hyperplasia)   . Hyperlipidemia   . Hypertension   . Tobacco abuse     Patient Active Problem List   Diagnosis Date Noted  . UTI (urinary tract infection) 06/25/2017  . Dementia with behavioral disturbance 05/28/2017  . 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 Memory loss 11/21/2016  . Essential hypertension, benign 11/21/2014  . 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 Hyperlipidemia LDL goal <100 08/09/2013  . Blind 08/09/2013  . Vitamin D insufficiency 08/09/2013  . Benign prostatic hypertrophy 08/09/2013  . Hyperglycemia 08/09/2013  . Tobacco abuse 08/09/2013    Past Surgical History:    Procedure Laterality Date  . bilateral inguinal hernia  1974   Dr. Viona Gilmore  . broken foot bone    . broken hand  1998        Home Medications    Prior to Admission medications   Medication Sig Start Date End Date Taking? Authorizing Provider  acetaminophen (TYLENOL) 325 MG tablet Take 650 mg by mouth 2 (two) times daily.    [provider]  aspirin 81 MG tablet Take 81 mg by mouth 2 (two) times daily.    [provider]  cholecalciferol (VITAMIN D) 1000 units tablet Take 1,000 Units by mouth daily.    [provider]  citalopram (CELEXA) 10 MG tablet Take 10 mg by mouth daily.    [provider]  divalproex (DEPAKOTE SPRINKLE) 125 MG capsule Take 250-500 mg by mouth 2 (two) times daily. 250 mg QAM and 500 mg QHS    [provider]  finasteride (PROSCAR) 5 MG tablet Take 5 mg by mouth daily.    [provider]  haloperidol (HALDOL) 2 MG tablet Take 2 mg by mouth 3 (three) times daily.    [provider]  lisinopril (PRINIVIL,ZESTRIL) 20 MG tablet Take 20 mg by mouth daily.    [provider]  LORazepam (ATIVAN) 0.5 MG tablet Take 1 tablet (0.5 mg total) by mouth daily as needed for anxiety. 07/09/17   Theodis Blaze, MD  tamsulosin (FLOMAX) 0.4 MG CAPS  capsule Take 0.4 mg by mouth daily after supper.    [provider]  traZODone (DESYREL) 50 MG tablet Take 50 mg by mouth at bedtime.    [provider]    Family History Family History  Problem Relation Age of Onset  . Cancer Mother        Breast  . Heart disease Father 57  . Colon cancer Neg Hx   . Stomach cancer Neg Hx     Social History Social History   Tobacco Use  . Smoking status: Current Every Day Smoker    Packs/day: 0.50    Types: Cigarettes  . Smokeless tobacco: Never Used  Substance Use Topics  . Alcohol use: No  . Drug use: Yes    Types: Marijuana    Comment: Quit back in College      Allergies    Epinephrine   Review of Systems Review of Systems  Unable to perform ROS: Dementia  Respiratory: Positive for shortness of breath.   Neurological: Positive for weakness. Negative for focal weakness.     Physical Exam Updated Vital Signs BP 138/79 (BP Location: Left Arm)   Pulse 68   Temp 98.5 F (36.9 C) (Oral)   Resp 15   Ht 5\' 7"  (1.702 m)   Wt 61.2 kg (135 lb)   SpO2 98%   BMI 21.14 kg/m   Physical Exam  Constitutional: He appears well-developed and well-nourished. No distress.  HENT:  Head: Normocephalic and atraumatic.  Right Ear: External ear normal.  Left Ear: External ear normal.  Neck: Normal range of motion. Neck supple. No tracheal deviation present.  Cardiovascular: Normal rate, regular rhythm, normal heart sounds and intact distal pulses.  Pulmonary/Chest: Effort normal and breath sounds normal. No stridor. No respiratory distress. He has no wheezes.  Abdominal: Soft. He exhibits no distension and no mass. There is no tenderness. There is no guarding.  Musculoskeletal: Normal range of motion. He exhibits no edema, tenderness or deformity.  Neurological: He is alert. He has normal strength. He is disoriented. No sensory deficit. GCS eye subscore is 4. GCS verbal subscore is 5. GCS motor subscore is 6.  Skin: He is not diaphoretic.     ED Treatments / Results  Labs (all labs ordered are listed, but only abnormal results are displayed) Labs Reviewed  MRSA PCR SCREENING - Abnormal; Notable for the following components:      Result Value   MRSA by PCR POSITIVE (*)    All other components within normal limits  COMPREHENSIVE METABOLIC PANEL - Abnormal; Notable for the following components:   Potassium 3.1 (*)    Glucose, Bld 122 (*)    BUN 21 (*)    Creatinine, Ser 0.60 (*)    Albumin 2.9 (*)    All other components within normal limits  CBC WITH DIFFERENTIAL/PLATELET - Abnormal; Notable for the following components:   WBC 20.9 (*)    RBC 3.73 (*)     Hemoglobin 12.4 (*)    HCT 35.9 (*)    Neutro Abs 18.5 (*)    Monocytes Absolute 1.5 (*)    All other components within normal limits  PHOSPHORUS - Abnormal; Notable for the following components:   Phosphorus 2.4 (*)    All other components within normal limits  CULTURE, BLOOD (ROUTINE X 2)  CULTURE, BLOOD (ROUTINE X 2)  CULTURE, EXPECTORATED SPUTUM-ASSESSMENT  GRAM STAIN  MAGNESIUM  URINALYSIS, ROUTINE W REFLEX MICROSCOPIC  STREP PNEUMONIAE URINARY ANTIGEN  MAGNESIUM  PHOSPHORUS  TSH  COMPREHENSIVE METABOLIC PANEL  CBC  I-STAT CG4 LACTIC ACID, ED  I-STAT TROPONIN, ED    EKG None  Radiology Dg Chest 2 View  Result Date: 08/16/2017 CLINICAL DATA:  Increased lethargy.  Cough.  Pneumonia. EXAM: CHEST - 2 VIEW COMPARISON:  Two-view chest x-ray 05/28/2017 FINDINGS: The heart size is normal. Atherosclerotic calcifications are present at the aortic arch. Ill-defined right middle lobe airspace disease is present. Posterior right lower lobe airspace disease is noted as well. No significant left-sided disease is present. Degenerative changes in the thoracolumbar spine and right greater than left shoulders are again noted. IMPRESSION: 1. Right middle and lower lobe pneumonia. 2. Atherosclerosis. 3. Degenerative changes of the thoracolumbar spine and right shoulder. Electronically Signed   By: San Morelle M.D.   On: 08/16/2017 19:17    Procedures Procedures (including critical care time)  Medications Ordered in ED Medications  sodium chloride 0.9 % bolus 1,000 mL (has no administration in time range)     Initial Impression / Assessment and Plan / ED Course  I have reviewed the triage vital signs and the nursing notes.  Pertinent labs & imaging results that were available during my care of the patient were reviewed by me and considered in my medical decision making (see chart for details).  Clinical Course as of Aug 17 113  Wed Aug 16, 2017  1955 Discussed with Dr.  Roel Cluck hospitalist who will admit the patient her service.   [MB]    Clinical Course User Index [MB] Hayden Rasmussen, MD   ddx pneumonia, uti, dehydration, acs, metabolic encephalopathy.   Final Clinical Impressions(s) / ED Diagnoses   Final diagnoses:  Pneumonia due to infectious organism, unspecified laterality, unspecified part of lung  Lethargy    ED Discharge Orders    None       Hayden Rasmussen, MD 08/17/17 1205

## 2017-08-16 NOTE — ED Triage Notes (Signed)
Pt to Ed via [facility] from [facility] facility with complaints of increased lethargy over the last 3 days.Pt was diagnosed with pnuemonia on the 14th and has been on Augmentin. Per EMS wife wanted patient to be seen "hes not getting any better". Pt is blind and has dementia. When asking the patient "patient says he does feel better"

## 2017-08-16 NOTE — ED Notes (Signed)
Have tried multiple attempts at getting urine when patient said he could give it, all with no result

## 2017-08-16 NOTE — ED Notes (Signed)
Pt relative at bedside will notify clinical team when pt verbalizes need to void

## 2017-08-17 DIAGNOSIS — E785 Hyperlipidemia, unspecified: Secondary | ICD-10-CM

## 2017-08-17 DIAGNOSIS — E876 Hypokalemia: Secondary | ICD-10-CM

## 2017-08-17 DIAGNOSIS — I10 Essential (primary) hypertension: Secondary | ICD-10-CM

## 2017-08-17 DIAGNOSIS — G9341 Metabolic encephalopathy: Secondary | ICD-10-CM

## 2017-08-17 LAB — CBC
HEMATOCRIT: 34.1 % — AB (ref 39.0–52.0)
HEMOGLOBIN: 11.4 g/dL — AB (ref 13.0–17.0)
MCH: 32.9 pg (ref 26.0–34.0)
MCHC: 33.4 g/dL (ref 30.0–36.0)
MCV: 98.6 fL (ref 78.0–100.0)
Platelets: 251 10*3/uL (ref 150–400)
RBC: 3.46 MIL/uL — ABNORMAL LOW (ref 4.22–5.81)
RDW: 15.1 % (ref 11.5–15.5)
WBC: 13.4 10*3/uL — AB (ref 4.0–10.5)

## 2017-08-17 LAB — MAGNESIUM: Magnesium: 1.8 mg/dL (ref 1.7–2.4)

## 2017-08-17 LAB — URINALYSIS, ROUTINE W REFLEX MICROSCOPIC
Bilirubin Urine: NEGATIVE
GLUCOSE, UA: NEGATIVE mg/dL
KETONES UR: 20 mg/dL — AB
NITRITE: POSITIVE — AB
PH: 6 (ref 5.0–8.0)
Protein, ur: 100 mg/dL — AB
SPECIFIC GRAVITY, URINE: 1.02 (ref 1.005–1.030)
Squamous Epithelial / LPF: NONE SEEN

## 2017-08-17 LAB — BLOOD CULTURE ID PANEL (REFLEXED)
ACINETOBACTER BAUMANNII: NOT DETECTED
CANDIDA ALBICANS: NOT DETECTED
CANDIDA GLABRATA: NOT DETECTED
CANDIDA KRUSEI: NOT DETECTED
CARBAPENEM RESISTANCE: NOT DETECTED
Candida parapsilosis: NOT DETECTED
Candida tropicalis: NOT DETECTED
ENTEROBACTERIACEAE SPECIES: DETECTED — AB
ESCHERICHIA COLI: NOT DETECTED
Enterobacter cloacae complex: NOT DETECTED
Enterococcus species: NOT DETECTED
Haemophilus influenzae: NOT DETECTED
KLEBSIELLA OXYTOCA: NOT DETECTED
KLEBSIELLA PNEUMONIAE: DETECTED — AB
Listeria monocytogenes: NOT DETECTED
Neisseria meningitidis: NOT DETECTED
PSEUDOMONAS AERUGINOSA: NOT DETECTED
Proteus species: NOT DETECTED
STAPHYLOCOCCUS AUREUS BCID: NOT DETECTED
STREPTOCOCCUS PNEUMONIAE: NOT DETECTED
STREPTOCOCCUS PYOGENES: NOT DETECTED
Serratia marcescens: NOT DETECTED
Staphylococcus species: NOT DETECTED
Streptococcus agalactiae: NOT DETECTED
Streptococcus species: NOT DETECTED

## 2017-08-17 LAB — COMPREHENSIVE METABOLIC PANEL
ALBUMIN: 2.4 g/dL — AB (ref 3.5–5.0)
ALT: 16 U/L — ABNORMAL LOW (ref 17–63)
AST: 14 U/L — AB (ref 15–41)
Alkaline Phosphatase: 57 U/L (ref 38–126)
Anion gap: 9 (ref 5–15)
BUN: 15 mg/dL (ref 6–20)
CHLORIDE: 104 mmol/L (ref 101–111)
CO2: 24 mmol/L (ref 22–32)
Calcium: 8.2 mg/dL — ABNORMAL LOW (ref 8.9–10.3)
Creatinine, Ser: 0.59 mg/dL — ABNORMAL LOW (ref 0.61–1.24)
GFR calc Af Amer: >60 mL/min (ref >60)
GLUCOSE: 103 mg/dL — AB (ref 65–99)
POTASSIUM: 3.3 mmol/L — AB (ref 3.5–5.1)
Sodium: 137 mmol/L (ref 135–145)
Total Bilirubin: 0.9 mg/dL (ref 0.3–1.2)
Total Protein: 6.1 g/dL — ABNORMAL LOW (ref 6.5–8.1)

## 2017-08-17 LAB — MRSA PCR SCREENING: MRSA by PCR: POSITIVE — AB

## 2017-08-17 LAB — PHOSPHORUS: Phosphorus: 2.2 mg/dL — ABNORMAL LOW (ref 2.5–4.6)

## 2017-08-17 LAB — TSH: TSH: 0.318 u[IU]/mL — AB (ref 0.350–4.500)

## 2017-08-17 LAB — STREP PNEUMONIAE URINARY ANTIGEN: Strep Pneumo Urinary Antigen: NEGATIVE

## 2017-08-17 MED ORDER — CHLORHEXIDINE GLUCONATE CLOTH 2 % EX PADS
6.0000 | MEDICATED_PAD | Freq: Every day | CUTANEOUS | Status: DC
  Administered 2017-08-17 – 2017-08-19 (×2): 6 via TOPICAL

## 2017-08-17 MED ORDER — SODIUM CHLORIDE 0.9 % IV SOLN
2.0000 g | INTRAVENOUS | Status: DC
  Administered 2017-08-18: 2 g via INTRAVENOUS
  Filled 2017-08-17: qty 2
  Filled 2017-08-17: qty 20

## 2017-08-17 MED ORDER — POTASSIUM CHLORIDE CRYS ER 10 MEQ PO TBCR
40.0000 meq | EXTENDED_RELEASE_TABLET | Freq: Once | ORAL | Status: AC
  Administered 2017-08-17: 40 meq via ORAL
  Filled 2017-08-17: qty 4

## 2017-08-17 MED ORDER — SODIUM CHLORIDE 0.9 % IV SOLN
2.0000 g | INTRAVENOUS | Status: DC
  Administered 2017-08-17: 2 g via INTRAVENOUS
  Filled 2017-08-17: qty 2

## 2017-08-17 MED ORDER — MUPIROCIN 2 % EX OINT
1.0000 "application " | TOPICAL_OINTMENT | Freq: Two times a day (BID) | CUTANEOUS | Status: DC
  Administered 2017-08-17 – 2017-08-19 (×4): 1 via NASAL
  Filled 2017-08-17: qty 22

## 2017-08-17 MED ORDER — SODIUM CHLORIDE 0.9 % IV SOLN
2.0000 g | INTRAVENOUS | Status: DC
  Filled 2017-08-17: qty 20

## 2017-08-17 NOTE — Progress Notes (Signed)
PROGRESS NOTE    Joe Dawson  HBZ:[id number] DOB: 1950-12-10 DOA: 08/16/2017 PCP: Gayland Curry, DO    Brief Narrative:  67 y.o. male with medical history significant of hypertension, severe dementia with behavioral changes, blindness and BPH    Presented with   confusion and somnolence for past 3 days Treated with Augmentin for past 1 week for possible PNA. unable to provide much hx. poorly started to become more confused and lethargic and wife requested him to be transferred to emergency department for further evaluation patient baseline has blindness and dementia. Per Report had low grade fevers at the facility. denies any chest pain. Wife reports cough productive. Denies diarrhea.   Assessment & Plan:   Active Problems:   Hyperlipidemia LDL goal <100   Blind   Benign prostatic hyperplasia   Tobacco abuse   Essential hypertension, benign   Dementia with behavioral disturbance   HCAP (healthcare-associated pneumonia)   Acute metabolic encephalopathy   Hypokalemia  . HCAP (healthcare-associated pneumonia) - patient was admitted and started on and started on empiric vancomycin and  Empiric vancomycin and cefepime.  Cefepime. - blood cultures positive for Enterobacter and Klebsiella species.discussed with pharmacyiscussed with pharmacy. We'll transition to Rocephin monotherapy.  Transition to Rocephin monotherapy.  -leukocytosis improving, patient afebrileLeukocytosis improving, patient afebrile  . Tobacco abuse no longer smoking given currently in SNF -StableStable  . Dementia with behavioral disturbance -expect some decline given acute illness monitor for sundowning -Patient presents from skilled nursing level of care.  Patient's wife at bedside who requested patient to be discharged home at time of discharge with home health.  . Essential hypertension, benign  - stable continue home medications as tolerated  . Acute metabolic encephalopathy -   - most likely  multifactorial secondary to combination of  infection  mild dehydration secondary to decreased by mouth intake,    - Given IVF hydratino -Continue abx per above -Seems stable at present   . Benign prostatic hyperplasia stable we will continue home medications as tolerated  DVT prophylaxis: Lovenox subQ Code Status: DNR Family Communication: Pt in room, wife at bedside Disposition Plan: Plan home with home health in next 24-48hrs  Consultants:     Procedures:     Antimicrobials: Anti-infectives (From admission, onward)   Start     Dose/Rate Route Frequency Ordered Stop   08/17/17 1600  vancomycin (VANCOCIN) 1,250 mg in sodium chloride 0.9 % 250 mL IVPB  Status:  Discontinued     1,250 mg 166.7 mL/hr over 90 Minutes Intravenous Every 24 hours 08/16/17 2211 08/17/17 1404   08/17/17 1500  cefTRIAXone (ROCEPHIN) 2 g in sodium chloride 0.9 % 100 mL IVPB     2 g 200 mL/hr over 30 Minutes Intravenous Every 24 hours 08/17/17 1404     08/16/17 2330  ceFEPIme (MAXIPIME) 1 g in sodium chloride 0.9 % 100 mL IVPB  Status:  Discontinued     1 g 200 mL/hr over 30 Minutes Intravenous Every 8 hours 08/16/17 2151 08/16/17 2236   08/16/17 2245  ceFEPIme (MAXIPIME) 1 g in sodium chloride 0.9 % 100 mL IVPB  Status:  Discontinued     1 g 200 mL/hr over 30 Minutes Intravenous Every 8 hours 08/16/17 2236 08/17/17 1403   08/16/17 1930  piperacillin-tazobactam (ZOSYN) IVPB 3.375 g     3.375 g 100 mL/hr over 30 Minutes Intravenous  Once 08/16/17 1925 08/16/17 2012   08/16/17 1930  vancomycin (VANCOCIN) IVPB 1000 mg/200 mL premix  1,000 mg 200 mL/hr over 60 Minutes Intravenous  Once 08/16/17 1925 08/16/17 2133       Subjective: Without complaints  Objective: Vitals:   08/16/17 2200 08/17/17 0006 08/17/17 0527 08/17/17 1400  BP: (!) 143/87 (!) 174/86 (!) 146/91 137/79  Pulse: 76 72 69 70  Resp: 18 18 19  (!) 22  Temp: 98.1 F (36.7 C) 99.3 F (37.4 C) 99 F (37.2 C) 98.5 F (36.9 C)    TempSrc: Oral Oral Oral Oral  SpO2: 98% 97% 97% 95%  Weight:      Height:        Intake/Output Summary (Last 24 hours) at 08/17/2017 1508 Last data filed at 08/17/2017 1436 Gross per 24 hour  Intake 2411.25 ml  Output 650 ml  Net 1761.25 ml   Filed Weights   08/16/17 1726 08/16/17 1941  Weight: 61.2 kg (135 lb) 57.6 kg (127 lb)    Examination:  General exam: Appears calm and comfortable  Respiratory system: Clear to auscultation. Respiratory effort normal. Cardiovascular system: S1 & S2 heard, regular Gastrointestinal system: Abdomen is nondistended, soft and nontender. No organomegaly or masses felt. Normal bowel sounds heard. Central nervous system: Alert and oriented. No focal neurological deficits. Extremities: Symmetric 5 x 5 power. Skin: No rashes, lesions  Psychiatry: Judgement and insight appear normal. Mood & affect appropriate.   Data Reviewed: I have personally reviewed following labs and imaging studies  CBC: Recent Labs  Lab 08/16/17 1810 08/17/17 0505  WBC 20.9* 13.4*  NEUTROABS 18.5*  --   HGB 12.4* 11.4*  HCT 35.9* 34.1*  MCV 96.2 98.6  PLT 282 371   Basic Metabolic Panel: Recent Labs  Lab 08/16/17 1810 08/16/17 1817 08/17/17 0505  NA 140  --  137  K 3.1*  --  3.3*  CL 103  --  104  CO2 26  --  24  GLUCOSE 122*  --  103*  BUN 21*  --  15  CREATININE 0.60*  --  0.59*  CALCIUM 9.0  --  8.2*  MG  --  1.9 1.8  PHOS  --  2.4* 2.2*   GFR: Estimated Creatinine Clearance: 72 mL/min (A) (by C-G formula based on SCr of 0.59 mg/dL (L)). Liver Function Tests: Recent Labs  Lab 08/16/17 1810 08/17/17 0505  AST 18 14*  ALT 19 16*  ALKPHOS 63 57  BILITOT 0.9 0.9  PROT 7.1 6.1*  ALBUMIN 2.9* 2.4*   No results for input(s): LIPASE, AMYLASE in the last 168 hours. No results for input(s): AMMONIA in the last 168 hours. Coagulation Profile: No results for input(s): INR, PROTIME in the last 168 hours. Cardiac Enzymes: No results for input(s):  CKTOTAL, CKMB, CKMBINDEX, TROPONINI in the last 168 hours. BNP (last 3 results) No results for input(s): PROBNP in the last 8760 hours. HbA1C: No results for input(s): HGBA1C in the last 72 hours. CBG: No results for input(s): GLUCAP in the last 168 hours. Lipid Profile: No results for input(s): CHOL, HDL, LDLCALC, TRIG, CHOLHDL, LDLDIRECT in the last 72 hours. Thyroid Function Tests: Recent Labs    08/17/17 0505  TSH 0.318*   Anemia Panel: No results for input(s): VITAMINB12, FOLATE, FERRITIN, TIBC, IRON, RETICCTPCT in the last 72 hours. Sepsis Labs: Recent Labs  Lab 08/16/17 1813  LATICACIDVEN 0.76    Recent Results (from the past 240 hour(s))  Blood Culture (routine x 2)     Status: None (Preliminary result)   Collection Time: 08/16/17  6:10 PM  Result  Value Ref Range Status   Specimen Description   Final    BLOOD LEFT FOREARM Performed at [facility] [address] [place], [place] [address]    Special Requests   Final    BOTTLES DRAWN AEROBIC AND ANAEROBIC Blood Culture results may not be optimal due to an excessive volume of blood received in culture bottles Performed at [facility] [address], [place], [place] [address]    Culture  Setup Time   Final    GRAM NEGATIVE RODS ANAEROBIC BOTTLE ONLY Organism ID to follow CRITICAL RESULT CALLED TO, READ BACK BY AND VERIFIED WITH: Guadlupe Spanish PharmD 12:30 08/17/17 ([email or web address]) Performed at [facility], [address] [place], [place] [address]    Culture GRAM NEGATIVE RODS  Final   Report Status PENDING  Incomplete  Blood Culture ID Panel (Reflexed)     Status: Abnormal   Collection Time: 08/16/17  6:10 PM  Result Value Ref Range Status   Enterococcus species NOT DETECTED NOT DETECTED Final   Listeria monocytogenes NOT DETECTED NOT DETECTED Final   Staphylococcus species NOT DETECTED NOT DETECTED Final   Staphylococcus aureus NOT DETECTED NOT DETECTED Final    Streptococcus species NOT DETECTED NOT DETECTED Final   Streptococcus agalactiae NOT DETECTED NOT DETECTED Final   Streptococcus pneumoniae NOT DETECTED NOT DETECTED Final   Streptococcus pyogenes NOT DETECTED NOT DETECTED Final   Acinetobacter baumannii NOT DETECTED NOT DETECTED Final   Enterobacteriaceae species DETECTED (A) NOT DETECTED Final    Comment: Enterobacteriaceae represent a large family of gram-negative bacteria, not a single organism. CRITICAL RESULT CALLED TO, READ BACK BY AND VERIFIED WITH: Guadlupe Spanish PharmD 12:30 08/17/17 ([email or web address])    Enterobacter cloacae complex NOT DETECTED NOT DETECTED Final   Escherichia coli NOT DETECTED NOT DETECTED Final   Klebsiella oxytoca NOT DETECTED NOT DETECTED Final   Klebsiella pneumoniae DETECTED (A) NOT DETECTED Final    Comment: CRITICAL RESULT CALLED TO, READ BACK BY AND VERIFIED WITH: Guadlupe Spanish PharmD 12:30 08/17/17 ([email or web address])    Proteus species NOT DETECTED NOT DETECTED Final   Serratia marcescens NOT DETECTED NOT DETECTED Final   Carbapenem resistance NOT DETECTED NOT DETECTED Final   Haemophilus influenzae NOT DETECTED NOT DETECTED Final   Neisseria meningitidis NOT DETECTED NOT DETECTED Final   Pseudomonas aeruginosa NOT DETECTED NOT DETECTED Final   Candida albicans NOT DETECTED NOT DETECTED Final   Candida glabrata NOT DETECTED NOT DETECTED Final   Candida krusei NOT DETECTED NOT DETECTED Final   Candida parapsilosis NOT DETECTED NOT DETECTED Final   Candida tropicalis NOT DETECTED NOT DETECTED Final    Comment: Performed at [facility], [place] [address], [place], [place] [address]  Blood Culture (routine x 2)     Status: None (Preliminary result)   Collection Time: 08/16/17  6:27 PM  Result Value Ref Range Status   Specimen Description   Final    BLOOD RIGHT FOREARM Performed at [place] [address], [place], [place] [address]    Special Requests   Final    BOTTLES DRAWN AEROBIC AND  ANAEROBIC Blood Culture adequate volume Performed at [facility] [address], [place], [place] [address]    Culture  Setup Time   Final    GRAM NEGATIVE RODS ANAEROBIC BOTTLE ONLY CRITICAL VALUE NOTED.  VALUE IS CONSISTENT WITH PREVIOUSLY REPORTED AND CALLED VALUE. Performed at [facility], [place],  [place] [address]    Culture GRAM NEGATIVE RODS  Final   Report Status PENDING  Incomplete  MRSA PCR Screening     Status: Abnormal   Collection Time: 08/16/17 10:13 PM  Result Value Ref Range Status   MRSA by PCR POSITIVE (A) NEGATIVE Final    Comment:        The GeneXpert MRSA Assay (FDA approved for NASAL specimens only), is one component of a comprehensive MRSA colonization surveillance program. It is not intended to diagnose MRSA infection nor to guide or monitor treatment for MRSA infections. RESULT CALLED TO, READ BACK BY AND VERIFIED WITH: GRANDY,B RN 3.28.19 @0025  ZANDO,C Performed at [facility] [place] [address] [place], [place] [address]      Radiology Studies: Dg Chest 2 View  Result Date: 08/16/2017 CLINICAL DATA:  Increased lethargy.  Cough.  Pneumonia. EXAM: CHEST - 2 VIEW COMPARISON:  Two-view chest x-ray 05/28/2017 FINDINGS: The heart size is normal. Atherosclerotic calcifications are present at the aortic arch. Ill-defined right middle lobe airspace disease is present. Posterior right lower lobe airspace disease is noted as well. No significant left-sided disease is present. Degenerative changes in the thoracolumbar spine and right greater than left shoulders are again noted. IMPRESSION: 1. Right middle and lower lobe pneumonia. 2. Atherosclerosis. 3. Degenerative changes of the thoracolumbar spine and right shoulder. Electronically Signed   By: San Morelle M.D.   On: 08/16/2017 19:17    Scheduled Meds: . aspirin EC  81 mg Oral Daily  . Chlorhexidine Gluconate Cloth  6 each Topical Q0600    . enoxaparin (LOVENOX) injection  40 mg Subcutaneous Q24H  . feeding supplement (ENSURE ENLIVE)  237 mL Oral BID BM  . finasteride  5 mg Oral Daily  . guaiFENesin  600 mg Oral BID  . haloperidol  2 mg Oral Q8H  . lisinopril  20 mg Oral Daily  . mupirocin ointment  1 application Nasal BID  . sertraline  50 mg Oral Daily  . tamsulosin  0.4 mg Oral QPC supper  . traZODone  50 mg Oral QHS   Continuous Infusions: . cefTRIAXone (ROCEPHIN)  IV 2 g (08/17/17 1443)     LOS: 1 day   Marylu Lund, MD [facility] Pager [phone]  If 7PM-7AM, please contact night-coverage [email or web address] Password [facility] 08/17/2017, 3:08 PM

## 2017-08-17 NOTE — Care Management Note (Signed)
Case Management Note  Patient Details  Name: Joe Dawson MRN: [id number] Date of Birth: Feb 28, 1951  Subjective/Objective:  67 yo admitted with HCAP. Hx of dementia               Action/Plan: Pt was admitted from SNF and wife wants to take him home. She states that she has all the equipment she needs and is hiring help for home. Choice offered for home health services and [facility] chosen. [facility] rep alerted of referral.  Expected Discharge Date:  (unknown)               Expected Discharge Plan:  [place]  In-House Referral:     Discharge planning Services  CM Consult  Post Acute Care Choice:  Home Health Choice offered to:  Spouse  DME Arranged:    DME Agency:     HH Arranged:  RN, PT [place] Agency:  [facility]  Status of Service:  In process, will continue to follow  If discussed at Long Length of Stay Meetings, dates discussed:    Additional CommentsLynnell Catalan, RN 08/17/2017, 1:29 PM  [phone]

## 2017-08-17 NOTE — Progress Notes (Signed)
LCSW received consult. Patient admitted from facility.   Per RNCM and PT/ OT notes patient patients wife is taking patient home and hiring additional help.   HH PT/OT has been set up by [facility].   LCSW signing off. No CSW needs. Please submit new consult if CSW needs arise.    Carolin Coy [place]

## 2017-08-17 NOTE — Progress Notes (Signed)
Initial Nutrition Assessment  DOCUMENTATION CODES:   Non-severe (moderate) malnutrition in context of acute illness/injury  INTERVENTION:   Ensure Enlive po BID, each supplement provides 350 kcal and 20 grams of protein  Magic cup TID with meals, each supplement provides 290 kcal and 9 grams of protein   NUTRITION DIAGNOSIS:   Moderate Malnutrition related to acute illness(HCAP with acute metabolic encephalopathy) as evidenced by percent weight loss, mild-moderate fat depletion, mild-moderate muscle depletion.   GOAL:   Patient will meet greater than or equal to 90% of their needs   MONITOR:   PO intake, Supplement acceptance, Labs, Weight trends  REASON FOR ASSESSMENT:   Malnutrition Screening Tool    ASSESSMENT:   67 yo male admitted from SNF 3/27 with HCAP, dehydration/hypophosphatemia, acute metabolic encephalopathy. History significant for severe dementia with behavioral changes blindness, memory loss/sundowning, prior tobacco abuse, HTN, hyperlipiemia  Spoke with RN who reported that pt refused breakfast.   Pt was largely unresponsive during nutrition assessment visit; he tried to answer some questions, but seemed very sedated and did not open his eyes and took a long time answering questions. He was able to say that he has not been very hungry lately.  Wife came to visit pt during nutrition assessment and she reported the following:   Pt has been at the SNF for PT since 07/25/2017  Pt contracted pneumonia a couple weeks ago; at this time the facility let the wife know pt was not eating well and pt was losing weight  Due to dehydration, pt weight loss may be multifactorial; however, wife noted that pt was losing weight over 2-3 weeks weeks (UBW 1 week ago 127 lbs at [facility])  Pt likes ice cream, drinks Boost at SNF, but pt can also drink Ensure  Pt assessed by RD for LOS on 2/14. At that time, pt had good PO in hospital and his weight loss was not significant at that  time.   Since pt was discharged, the medical record indicates severe wt loss of 10.8% in <2 months; however, pt's dehydration may skew his current weight lower.  -- Will monitor weight status as pt dehydration is corrected   Bed wt obtained during nutrition assessment: 58.7 kg  Wt Readings from Last 15 Encounters:  08/16/17 127 lb (57.6 kg)  06/25/17 145 lb (65.8 kg)  05/28/17 140 lb (63.5 kg)  11/21/16 142 lb (64.4 kg)  08/05/16 143 lb (64.9 kg)  05/09/16 141 lb 12.8 oz (64.3 kg)  11/21/14 137 lb 3.2 oz (62.2 kg)  01/23/14 139 lb 9.6 oz (63.3 kg)  08/09/13 140 lb (63.5 kg)  06/29/11 145 lb (65.8 kg)  06/16/11 145 lb 1.6 oz (65.8 kg)     Medications: K-phos, IV maxipime/vancomycin  Labs: K 3.3 L, Phos 2.2 L, Mg 1.8 wnl   NUTRITION - FOCUSED PHYSICAL EXAM:    Most Recent Value  Orbital Region  Moderate depletion  Upper Arm Region  Mild depletion  Thoracic and Lumbar Region  No depletion  Buccal Region  Mild depletion  Temple Region  Moderate depletion  Clavicle Bone Region  Moderate depletion  Clavicle and Acromion Bone Region  Moderate depletion  Scapular Bone Region  Unable to assess [Pt unable to sit up]  Dorsal Hand  Unable to assess [Hand mits]  Patellar Region  Mild depletion  Anterior Thigh Region  Moderate depletion  Posterior Calf Region  Mild depletion  Edema (RD Assessment)  None  Hair  Reviewed  Eyes  Unable  to assess  Mouth  Unable to assess  Skin  Reviewed  Nails  Reviewed       Diet Order:  Diet 2 gram sodium Room service appropriate? Yes; Fluid consistency: Thin  EDUCATION NEEDS:   Not appropriate for education at this time  Skin:  Skin Assessment: Skin Integrity Issues: Skin Integrity Issues:: Other (Comment) Other: Moisture skin damage-- groin  Last BM:  3/26  Height:   Ht Readings from Last 1 Encounters:  08/16/17 5\' 7"  (1.702 m)    Weight:   Wt Readings from Last 1 Encounters:  08/16/17 127 lb (57.6 kg)    Ideal Body  Weight:  67.2 kg  BMI:  Body mass index is 19.89 kg/m.  Estimated Nutritional Needs:   Kcal:  1650-1850 kcal (MSJ* 1.25-1.4)  Protein:  80-90 grams (20% kcal)  Fluid:  >=1.7 L/day    Ross Intern Pager: [phone]

## 2017-08-17 NOTE — Evaluation (Signed)
Occupational Therapy Evaluation Patient Details Name: Joe Dawson MRN: [id number] DOB: 1951/02/28 Today's Date: 08/17/2017    History of Present Illness 67 yo male admitted with pna, worsening confusion, behavioral changes. hx of dementia, blind, BPH.    Clinical Impression   Pt was admitted for the above. Prior to admission he was at snf for rehab.  Wife wants to take him home after this admission and hire caregivers to help as needed.  She has all needed DME.  Pt was participating in ADLs and using RW at SNF.  He did better with SPT vs using RW for transfer at this time. Will follow with min to mod A level goals.  Focus will be on mobility to decrease burden of ADLs.    Follow Up Recommendations  Home health OT    Equipment Recommendations  None recommended by OT    Recommendations for Other Services       Precautions / Restrictions Precautions Precautions: Fall Restrictions Weight Bearing Restrictions: No      Mobility Bed Mobility Overal bed mobility: Needs Assistance Bed Mobility: Supine to Sit;Sit to Supine     Supine to sit: Max assist;+2 for physical assistance Sit to supine: Max assist;+2 for physical assistance   General bed mobility comments: assist for legs and trunk  Transfers Overall transfer level: Needs assistance   Transfers: Sit to/from Stand;Stand Pivot Transfers;Squat Pivot Transfers Sit to Stand: Mod assist;Max assist;+2 physical assistance Stand pivot transfers: Max assist;+2 physical assistance;+2 safety/equipment Squat pivot transfers: Mod assist;+2 physical assistance;+2 safety/equipment     General transfer comment: Squat pivot, bed to recliner. Attempted stand pivot with RW from recliner-increased assistance required. Pt had difficulty maintaing upright posture and advancing LEs.     Balance                                           ADL either performed or assessed with clinical judgement   ADL Overall ADL's : Needs  assistance/impaired Eating/Feeding: Maximal assistance;Sitting   Grooming: Set up;Wash/dry face;Sitting   Upper Body Bathing: Sitting;Moderate assistance   Lower Body Bathing: Total assistance;+2 for physical assistance;Sit to/from stand   Upper Body Dressing : Maximal assistance;Bed level   Lower Body Dressing: Total assistance;+2 for physical assistance;Bed level   Toilet Transfer: Maximal assistance;Moderate assistance;+2 for physical assistance;Stand-pivot Toilet Transfer Details (indicate cue type and reason): to chair.  No AD during first transfer to chair; needed more assist when RW was used.  Did not pivot feet. Also more assist to rise due to low surface Toileting- Clothing Manipulation and Hygiene: Total assistance;+2 for physical assistance;Sit to/from stand         General ADL Comments: wife present during session. Transferred to chair then back to bed as linen was changed.      Vision Baseline Vision/History: (blind)       Perception     Praxis      Pertinent Vitals/Pain Pain Assessment: No/denies pain     Hand Dominance     Extremity/Trunk Assessment Upper Extremity Assessment Upper Extremity Assessment: WFLs   Lower Extremity Assessment Lower Extremity Assessment: Generalized weakness   Cervical / Trunk Assessment Cervical / Trunk Assessment: Other exceptions Cervical / Trunk Exceptions:  cervical R lateral flexion   Communication Communication Communication: (very soft spoken)   Cognition Arousal/Alertness: Awake/alert Behavior During Therapy: WFL for tasks assessed/performed Overall Cognitive Status: History of  cognitive impairments - at baseline                                 General Comments: pt making jokes. Needs multimodal cues to follow commands:  dementia and blind   General Comments       Exercises     Shoulder Instructions      Home Living Family/patient expects to be discharged to:: Private residence Living  Arrangements: Spouse/significant other Available Help at Discharge: Family Type of Home: House Home Access: [place]: One [place]: Environmental consultant - 2 wheels;Wheelchair - manual;Bedside commode   Additional Comments: pt was at [facility] for rehab. Wife plans to hire assistance and take him home. They have a tub lift      Prior Functioning/Environment Level of Independence: Needs assistance  Gait / Transfers Assistance Needed: walking ~20 feet with a RW and PT     Comments: was participating in adls and using RW at SNF        OT Problem List: Decreased strength;Decreased activity tolerance;Impaired balance (sitting and/or standing);Decreased cognition;Decreased safety awareness      OT Treatment/Interventions: Self-care/ADL training;DME and/or AE instruction;Balance training;Patient/family education;Therapeutic activities    OT Goals(Current goals can be found in the care plan section) Acute Rehab OT Goals Patient Stated Goal: wife's: take pt home OT Goal Formulation: With family Time For Goal Achievement: 08/31/17 Potential to Achieve Goals: Good ADL Goals Pt Will Transfer to Toilet: with min assist;bedside commode;stand pivot transfer Additional ADL Goal #1: pt will go from sit to stand with min A and maintain with this level of assist for 1 min for adls Additional ADL Goal #2: pt will perform bed mobility with mod A in preparation for adls/toilet transfers  OT Frequency: Min 2X/week   Barriers to D/C:            Co-evaluation PT/OT/SLP Co-Evaluation/Treatment: Yes Reason for Co-Treatment: For patient/therapist safety PT goals addressed during session: Mobility/safety with mobility OT goals addressed during session: ADL's and self-care      AM-PAC PT "6 Clicks" Daily Activity     Outcome Measure Help from another person eating meals?: A Lot Help from another person taking care of personal grooming?: A  Little Help from another person toileting, which includes using toliet, bedpan, or urinal?: Total Help from another person bathing (including washing, rinsing, drying)?: A Lot Help from another person to put on and taking off regular upper body clothing?: A Lot Help from another person to put on and taking off regular lower body clothing?: Total 6 Click Score: 11   End of Session    Activity Tolerance: Patient tolerated treatment well Patient left: in bed;with call bell/phone within reach;with bed alarm set;with family/visitor present  OT Visit Diagnosis: Unsteadiness on feet (R26.81)                Time: 4098-1191 OT Time Calculation (min): 32 min Charges:  OT General Charges $OT Visit: 1 Visit OT Evaluation $OT Eval Moderate Complexity: 1 Mod G-Codes:     [place], OTR/L [phone] 08/17/2017  Sanjana Folz 08/17/2017, 1:48 PM

## 2017-08-17 NOTE — Evaluation (Signed)
Physical Therapy Evaluation Patient Details Name: Joe Dawson MRN: [id number] DOB: Dec 29, 1950 Today's Date: 08/17/2017   History of Present Illness  67 yo male admitted with pna, worsening confusion, behavioral changes. hx of dementia, blind, BPH.   Clinical Impression  On eval, pt required Max assist +2 for mobility. Max multimodal cueing required. Pt is weak. Wife was present during session. She plans to take pt back home at discharge. Will follow and progress activity as able. Recommend HHPT.     Follow Up Recommendations Home health PT;Supervision/Assistance - 24 hour(wife states she wants to take pt home)    Equipment Recommendations  None recommended by PT    Recommendations for Other Services       Precautions / Restrictions Precautions Precautions: Fall Restrictions Weight Bearing Restrictions: No      Mobility  Bed Mobility Overal bed mobility: Needs Assistance Bed Mobility: Supine to Sit;Sit to Supine     Supine to sit: Max assist;+2 for physical assistance Sit to supine: Max assist;+2 for physical assistance   General bed mobility comments: assist for legs and trunk  Transfers Overall transfer level: Needs assistance   Transfers: Sit to/from Stand;Stand Pivot Transfers;Squat Pivot Transfers Sit to Stand: Mod assist;Max assist;+2 physical assistance Stand pivot transfers: Max assist;+2 physical assistance;+2 safety/equipment Squat pivot transfers: Mod assist;+2 physical assistance;+2 safety/equipment     General transfer comment: Squat pivot, bed to recliner. Attempted stand pivot with RW from recliner-increased assistance required. Pt had difficulty maintaing upright posture and advancing LEs.   Ambulation/Gait                Stairs            Wheelchair Mobility    Modified Rankin (Stroke Patients Only)       Balance                                             Pertinent Vitals/Pain Pain Assessment: No/denies  pain    Home Living Family/patient expects to be discharged to:: Private residence Living Arrangements: Spouse/significant other Available Help at Discharge: Family Type of Home: House Home Access: [place]: One [place]: Environmental consultant - 2 wheels;Wheelchair - manual;Bedside commode Additional Comments: pt was at [facility] for rehab. Wife plans to hire assistance and take him home. They have a tub lift    Prior Function Level of Independence: Needs assistance   Gait / Transfers Assistance Needed: walking ~20 feet with a RW and PT     Comments: was participating in adls and using RW at SNF     Hand Dominance        Extremity/Trunk Assessment   Upper Extremity Assessment Upper Extremity Assessment: Defer to OT evaluation    Lower Extremity Assessment Lower Extremity Assessment: Generalized weakness    Cervical / Trunk Assessment Cervical / Trunk Assessment: Other exceptions Cervical / Trunk Exceptions:  cervical R lateral flexion  Communication   Communication: (very soft spoken)  Cognition Arousal/Alertness: Awake/alert Behavior During Therapy: WFL for tasks assessed/performed Overall Cognitive Status: History of cognitive impairments - at baseline                                 General Comments: pt making jokes. Needs multimodal cues to follow commands:  dementia  and blind      General Comments      Exercises     Assessment/Plan    PT Assessment Patient needs continued PT services  PT Problem List Decreased strength;Decreased balance;Decreased mobility;Decreased activity tolerance;Decreased knowledge of use of DME;Decreased cognition       PT Treatment Interventions DME instruction;Functional mobility training;Gait training;Therapeutic activities;Balance training;Patient/family education;Therapeutic exercise    PT Goals (Current goals can be found in the Care Plan section)  Acute Rehab PT Goals Patient  Stated Goal: wife's: take pt home PT Goal Formulation: With family Time For Goal Achievement: 08/31/17 Potential to Achieve Goals: Fair    Frequency Min 3X/week   Barriers to discharge        Co-evaluation   Reason for Co-Treatment: For patient/therapist safety PT goals addressed during session: Mobility/safety with mobility OT goals addressed during session: ADL's and self-care       AM-PAC PT "6 Clicks" Daily Activity  Outcome Measure Difficulty turning over in bed (including adjusting bedclothes, sheets and blankets)?: Unable Difficulty moving from lying on back to sitting on the side of the bed? : Unable Difficulty sitting down on and standing up from a chair with arms (e.g., wheelchair, bedside commode, etc,.)?: Unable Help needed moving to and from a bed to chair (including a wheelchair)?: Total Help needed walking in hospital room?: Total Help needed climbing 3-5 steps with a railing? : Total 6 Click Score: 6    End of Session Equipment Utilized During Treatment: Gait belt Activity Tolerance: Patient tolerated treatment well Patient left: in bed;with call bell/phone within reach;with bed alarm set   PT Visit Diagnosis: Muscle weakness (generalized) (M62.81);Difficulty in walking, not elsewhere classified (R26.2);Other abnormalities of gait and mobility (R26.89)    Time: 1031-1101 PT Time Calculation (min) (ACUTE ONLY): 30 min   Charges:   PT Evaluation $PT Eval Moderate Complexity: 1 Mod     PT G Codes:          Weston Anna, MPT Pager: [phone]

## 2017-08-17 NOTE — Progress Notes (Signed)
PHARMACY - PHYSICIAN COMMUNICATION CRITICAL VALUE ALERT - BLOOD CULTURE IDENTIFICATION (BCID)  Joe Dawson is an 67 y.o. male who presented to [facility] on 08/16/2017 with a chief complaint of weakness & confusion.  Assessment:  CXR + PNA.  He had been on Augmentin x7 days prior to admission. He has hx Klebsiella UTI in Feb 2019 (R=Ampicillin, S=cephalosporins, Bactrim)  Name of physician (or Provider) Contacted: Wyline Copas  Current antibiotics: Vancomycin & Cefepime  Changes to prescribed antibiotics recommended: De-escalate antibiotics to Rocephin 2gm IV q24h Recommendations accepted by provider  Results for orders placed or performed during the hospital encounter of 08/16/17  Blood Culture ID Panel (Reflexed) (Collected: 08/16/2017  6:10 PM)  Result Value Ref Range   Enterococcus species NOT DETECTED NOT DETECTED   Listeria monocytogenes NOT DETECTED NOT DETECTED   Staphylococcus species NOT DETECTED NOT DETECTED   Staphylococcus aureus NOT DETECTED NOT DETECTED   Streptococcus species NOT DETECTED NOT DETECTED   Streptococcus agalactiae NOT DETECTED NOT DETECTED   Streptococcus pneumoniae NOT DETECTED NOT DETECTED   Streptococcus pyogenes NOT DETECTED NOT DETECTED   Acinetobacter baumannii NOT DETECTED NOT DETECTED   Enterobacteriaceae species DETECTED (A) NOT DETECTED   Enterobacter cloacae complex NOT DETECTED NOT DETECTED   Escherichia coli NOT DETECTED NOT DETECTED   Klebsiella oxytoca NOT DETECTED NOT DETECTED   Klebsiella pneumoniae DETECTED (A) NOT DETECTED   Proteus species NOT DETECTED NOT DETECTED   Serratia marcescens NOT DETECTED NOT DETECTED   Carbapenem resistance NOT DETECTED NOT DETECTED   Haemophilus influenzae NOT DETECTED NOT DETECTED   Neisseria meningitidis NOT DETECTED NOT DETECTED   Pseudomonas aeruginosa NOT DETECTED NOT DETECTED   Candida albicans NOT DETECTED NOT DETECTED   Candida glabrata NOT DETECTED NOT DETECTED   Candida krusei NOT DETECTED NOT  DETECTED   Candida parapsilosis NOT DETECTED NOT DETECTED   Candida tropicalis NOT DETECTED NOT DETECTED    Biagio Borg 08/17/2017  1:49 PM

## 2017-08-18 DIAGNOSIS — J189 Pneumonia, unspecified organism: Principal | ICD-10-CM

## 2017-08-18 DIAGNOSIS — R5383 Other fatigue: Secondary | ICD-10-CM

## 2017-08-18 LAB — CBC
HCT: 35.8 % — ABNORMAL LOW (ref 39.0–52.0)
Hemoglobin: 12.3 g/dL — ABNORMAL LOW (ref 13.0–17.0)
MCH: 33 pg (ref 26.0–34.0)
MCHC: 34.4 g/dL (ref 30.0–36.0)
MCV: 96 fL (ref 78.0–100.0)
PLATELETS: 252 10*3/uL (ref 150–400)
RBC: 3.73 MIL/uL — ABNORMAL LOW (ref 4.22–5.81)
RDW: 14.6 % (ref 11.5–15.5)
WBC: 9.8 10*3/uL (ref 4.0–10.5)

## 2017-08-18 LAB — BASIC METABOLIC PANEL
Anion gap: 9 (ref 5–15)
BUN: 12 mg/dL (ref 6–20)
CHLORIDE: 103 mmol/L (ref 101–111)
CO2: 25 mmol/L (ref 22–32)
CREATININE: 0.52 mg/dL — AB (ref 0.61–1.24)
Calcium: 8.3 mg/dL — ABNORMAL LOW (ref 8.9–10.3)
GFR calc Af Amer: >60 mL/min (ref >60)
Glucose, Bld: 112 mg/dL — ABNORMAL HIGH (ref 65–99)
Potassium: 3.2 mmol/L — ABNORMAL LOW (ref 3.5–5.1)
SODIUM: 137 mmol/L (ref 135–145)

## 2017-08-18 MED ORDER — POTASSIUM CHLORIDE CRYS ER 10 MEQ PO TBCR
40.0000 meq | EXTENDED_RELEASE_TABLET | Freq: Once | ORAL | Status: AC
  Administered 2017-08-18: 40 meq via ORAL
  Filled 2017-08-18: qty 4

## 2017-08-18 NOTE — Progress Notes (Signed)
PROGRESS NOTE    Joe Dawson  ZSW:[id number] DOB: [birth year] DOA: 08/16/2017 PCP: Gayland Curry, DO    Brief Narrative:  67 y.o. male with medical history significant of hypertension, severe dementia with behavioral changes, blindness and BPH    Presented with   confusion and somnolence for past 3 days Treated with Augmentin for past 1 week for possible PNA. unable to provide much hx. poorly started to become more confused and lethargic and wife requested him to be transferred to emergency department for further evaluation patient baseline has blindness and dementia. Per Report had low grade fevers at the facility. denies any chest pain. Wife reports cough productive. Denies diarrhea.   Assessment & Plan:   Active Problems:   Hyperlipidemia LDL goal <100   Blind   Benign prostatic hyperplasia   Tobacco abuse   Essential hypertension, benign   Dementia with behavioral disturbance   HCAP (healthcare-associated pneumonia)   Acute metabolic encephalopathy   Hypokalemia  . HCAP (healthcare-associated pneumonia) - patient was admitted and started on and started on empiric vancomycin and  Empiric vancomycin and cefepime.  Cefepime. - blood cultures positive for Enterobacter and Klebsiella species.discussed with pharmacyiscussed with pharmacy. Currently on Rocephin monotherapy.  -leukocytosis normalized. Low grade temperature noted overnight. Will continue abx and monitor  . Tobacco abuse no longer smoking given currently in SNF -Stable at this time  . Dementia with behavioral disturbance -expect some decline given acute illness monitor for sundowning -Patient presents from skilled nursing level of care.  Patient's wife at bedside who requested patient to be discharged home at time of discharge with home health. -Appears stable at present  . Essential hypertension, benign  - remains stable continue home medications as tolerated  . Acute metabolic encephalopathy -   - most  likely multifactorial secondary to combination of  infection  mild dehydration secondary to decreased by mouth intake, Patient had been given IVF hydration this admit. Seems stable  . Benign prostatic hyperplasia - currently stable. we will continue home medications as tolerated  DVT prophylaxis: Lovenox subQ Code Status: DNR Family Communication: Pt in room, wife at bedside Disposition Plan: Plan home with home health in next 24-48hrs  Consultants:     Procedures:     Antimicrobials: Anti-infectives (From admission, onward)   Start     Dose/Rate Route Frequency Ordered Stop   08/17/17 2000  cefTRIAXone (ROCEPHIN) 2 g in sodium chloride 0.9 % 100 mL IVPB  Status:  Discontinued     2 g 200 mL/hr over 30 Minutes Intravenous Every 24 hours 08/17/17 1942 08/17/17 1948   08/17/17 1600  vancomycin (VANCOCIN) 1,250 mg in sodium chloride 0.9 % 250 mL IVPB  Status:  Discontinued     1,250 mg 166.7 mL/hr over 90 Minutes Intravenous Every 24 hours 08/16/17 2211 08/17/17 1404   08/17/17 1500  cefTRIAXone (ROCEPHIN) 2 g in sodium chloride 0.9 % 100 mL IVPB  Status:  Discontinued     2 g 200 mL/hr over 30 Minutes Intravenous Every 24 hours 08/17/17 1404 08/17/17 1942   08/17/17 1500  cefTRIAXone (ROCEPHIN) 2 g in sodium chloride 0.9 % 100 mL IVPB     2 g 200 mL/hr over 30 Minutes Intravenous Every 24 hours 08/17/17 1948     08/16/17 2330  ceFEPIme (MAXIPIME) 1 g in sodium chloride 0.9 % 100 mL IVPB  Status:  Discontinued     1 g 200 mL/hr over 30 Minutes Intravenous Every 8 hours 08/16/17 2151 08/16/17  2236   08/16/17 2245  ceFEPIme (MAXIPIME) 1 g in sodium chloride 0.9 % 100 mL IVPB  Status:  Discontinued     1 g 200 mL/hr over 30 Minutes Intravenous Every 8 hours 08/16/17 2236 08/17/17 1403   08/16/17 1930  piperacillin-tazobactam (ZOSYN) IVPB 3.375 g     3.375 g 100 mL/hr over 30 Minutes Intravenous  Once 08/16/17 1925 08/16/17 2012   08/16/17 1930  vancomycin (VANCOCIN) IVPB 1000  mg/200 mL premix     1,000 mg 200 mL/hr over 60 Minutes Intravenous  Once 08/16/17 1925 08/16/17 2133      Subjective: No complaints at this time  Objective: Vitals:   08/17/17 2013 08/18/17 0004 08/18/17 0410 08/18/17 1213  BP: (!) 173/97 136/79 (!) 164/92   Pulse: 96 65 71   Resp: (!) 22 20 20    Temp:  99.7 F (37.6 C) (!) 100.8 F (38.2 C) 99.2 F (37.3 C)  TempSrc:  Axillary Axillary Oral  SpO2: 94% 92% 95%   Weight:      Height:        Intake/Output Summary (Last 24 hours) at 08/18/2017 1445 Last data filed at 08/18/2017 1423 Gross per 24 hour  Intake -  Output 1700 ml  Net -1700 ml   Filed Weights   08/16/17 1726 08/16/17 1941  Weight: 61.2 kg (135 lb) 57.6 kg (127 lb)    Examination: General exam: Awake, laying in bed, in nad Respiratory system: Normal respiratory effort, no wheezing Cardiovascular system: regular rate, s1, s2 Gastrointestinal system: Soft, nondistended, positive BS Central nervous system: CN2-12 grossly intact, strength intact Extremities: Perfused, no clubbing Skin: Normal skin turgor, no notable skin lesions seen Psychiatry: Mood normal // no visual hallucinations   Data Reviewed: I have personally reviewed following labs and imaging studies  CBC: Recent Labs  Lab 08/16/17 1810 08/17/17 0505 08/18/17 0549  WBC 20.9* 13.4* 9.8  NEUTROABS 18.5*  --   --   HGB 12.4* 11.4* 12.3*  HCT 35.9* 34.1* 35.8*  MCV 96.2 98.6 96.0  PLT 282 251 865   Basic Metabolic Panel: Recent Labs  Lab 08/16/17 1810 08/16/17 1817 08/17/17 0505 08/18/17 0549  NA 140  --  137 137  K 3.1*  --  3.3* 3.2*  CL 103  --  104 103  CO2 26  --  24 25  GLUCOSE 122*  --  103* 112*  BUN 21*  --  15 12  CREATININE 0.60*  --  0.59* 0.52*  CALCIUM 9.0  --  8.2* 8.3*  MG  --  1.9 1.8  --   PHOS  --  2.4* 2.2*  --    GFR: Estimated Creatinine Clearance: 72 mL/min (A) (by C-G formula based on SCr of 0.52 mg/dL (L)). Liver Function Tests: Recent Labs  Lab  08/16/17 1810 08/17/17 0505  AST 18 14*  ALT 19 16*  ALKPHOS 63 57  BILITOT 0.9 0.9  PROT 7.1 6.1*  ALBUMIN 2.9* 2.4*   No results for input(s): LIPASE, AMYLASE in the last 168 hours. No results for input(s): AMMONIA in the last 168 hours. Coagulation Profile: No results for input(s): INR, PROTIME in the last 168 hours. Cardiac Enzymes: No results for input(s): CKTOTAL, CKMB, CKMBINDEX, TROPONINI in the last 168 hours. BNP (last 3 results) No results for input(s): PROBNP in the last 8760 hours. HbA1C: No results for input(s): HGBA1C in the last 72 hours. CBG: No results for input(s): GLUCAP in the last 168 hours. Lipid Profile:  No results for input(s): CHOL, HDL, LDLCALC, TRIG, CHOLHDL, LDLDIRECT in the last 72 hours. Thyroid Function Tests: Recent Labs    08/17/17 0505  TSH 0.318*   Anemia Panel: No results for input(s): VITAMINB12, FOLATE, FERRITIN, TIBC, IRON, RETICCTPCT in the last 72 hours. Sepsis Labs: Recent Labs  Lab 08/16/17 1813  LATICACIDVEN 0.76    Recent Results (from the past 240 hour(s))  Blood Culture (routine x 2)     Status: Abnormal (Preliminary result)   Collection Time: 08/16/17  6:10 PM  Result Value Ref Range Status   Specimen Description   Final    BLOOD LEFT FOREARM Performed at [facility] [address], [place], [place] [address]    Special Requests   Final    BOTTLES DRAWN AEROBIC AND ANAEROBIC Blood Culture results may not be optimal due to an excessive volume of blood received in culture bottles Performed at [facility] [address] [place], [place] [address]    Culture  Setup Time   Final    GRAM NEGATIVE RODS ANAEROBIC BOTTLE ONLY CRITICAL RESULT CALLED TO, READ BACK BY AND VERIFIED WITH: Guadlupe Spanish PharmD 12:30 08/17/17 ([email or web address])    Culture (A)  Final    KLEBSIELLA PNEUMONIAE SUSCEPTIBILITIES TO FOLLOW Performed at [facility], [place] [address], [place], [place] [address]      Report Status PENDING  Incomplete  Blood Culture ID Panel (Reflexed)     Status: Abnormal   Collection Time: 08/16/17  6:10 PM  Result Value Ref Range Status   Enterococcus species NOT DETECTED NOT DETECTED Final   Listeria monocytogenes NOT DETECTED NOT DETECTED Final   Staphylococcus species NOT DETECTED NOT DETECTED Final   Staphylococcus aureus NOT DETECTED NOT DETECTED Final   Streptococcus species NOT DETECTED NOT DETECTED Final   Streptococcus agalactiae NOT DETECTED NOT DETECTED Final   Streptococcus pneumoniae NOT DETECTED NOT DETECTED Final   Streptococcus pyogenes NOT DETECTED NOT DETECTED Final   Acinetobacter baumannii NOT DETECTED NOT DETECTED Final   Enterobacteriaceae species DETECTED (A) NOT DETECTED Final    Comment: Enterobacteriaceae represent a large family of gram-negative bacteria, not a single organism. CRITICAL RESULT CALLED TO, READ BACK BY AND VERIFIED WITH: Guadlupe Spanish PharmD 12:30 08/17/17 ([email or web address])    Enterobacter cloacae complex NOT DETECTED NOT DETECTED Final   Escherichia coli NOT DETECTED NOT DETECTED Final   Klebsiella oxytoca NOT DETECTED NOT DETECTED Final   Klebsiella pneumoniae DETECTED (A) NOT DETECTED Final    Comment: CRITICAL RESULT CALLED TO, READ BACK BY AND VERIFIED WITH: Guadlupe Spanish PharmD 12:30 08/17/17 ([email or web address])    Proteus species NOT DETECTED NOT DETECTED Final   Serratia marcescens NOT DETECTED NOT DETECTED Final   Carbapenem resistance NOT DETECTED NOT DETECTED Final   Haemophilus influenzae NOT DETECTED NOT DETECTED Final   Neisseria meningitidis NOT DETECTED NOT DETECTED Final   Pseudomonas aeruginosa NOT DETECTED NOT DETECTED Final   Candida albicans NOT DETECTED NOT DETECTED Final   Candida glabrata NOT DETECTED NOT DETECTED Final   Candida krusei NOT DETECTED NOT DETECTED Final   Candida parapsilosis NOT DETECTED NOT DETECTED Final   Candida tropicalis NOT DETECTED NOT DETECTED Final    Comment: Performed at [facility], [place] [address], [place], [place] [address]  Blood Culture (routine x 2)     Status: None (Preliminary result)   Collection Time: 08/16/17  6:27 PM  Result Value Ref Range Status   Specimen Description  Final    BLOOD RIGHT FOREARM Performed at [facility] [address], [place], [place] [address]    Special Requests   Final    BOTTLES DRAWN AEROBIC AND ANAEROBIC Blood Culture adequate volume Performed at [facility] [address] [place], [place] [address]    Culture  Setup Time   Final    GRAM NEGATIVE RODS IN BOTH AEROBIC AND ANAEROBIC BOTTLES CRITICAL VALUE NOTED.  VALUE IS CONSISTENT WITH PREVIOUSLY REPORTED AND CALLED VALUE. Performed at [facility], [place] [address], [place], [place] [address]    Culture GRAM NEGATIVE RODS  Final   Report Status PENDING  Incomplete  MRSA PCR Screening     Status: Abnormal   Collection Time: 08/16/17 10:13 PM  Result Value Ref Range Status   MRSA by PCR POSITIVE (A) NEGATIVE Final    Comment:        The GeneXpert MRSA Assay (FDA approved for NASAL specimens only), is one component of a comprehensive MRSA colonization surveillance program. It is not intended to diagnose MRSA infection nor to guide or monitor treatment for MRSA infections. RESULT CALLED TO, READ BACK BY AND VERIFIED WITH: GRANDY,B RN 3.28.19 @0025  ZANDO,C Performed at [facility], [place] [address] [place],  [address]      Radiology Studies: Dg Chest 2 View  Result Date: 08/16/2017 CLINICAL DATA:  Increased lethargy.  Cough.  Pneumonia. EXAM: CHEST - 2 VIEW COMPARISON:  Two-view chest x-ray 05/28/2017 FINDINGS: The heart size is normal. Atherosclerotic calcifications are present at the aortic arch. Ill-defined right middle lobe airspace disease is present. Posterior right lower lobe airspace disease is noted as well. No significant left-sided disease is present. Degenerative changes in  the thoracolumbar spine and right greater than left shoulders are again noted. IMPRESSION: 1. Right middle and lower lobe pneumonia. 2. Atherosclerosis. 3. Degenerative changes of the thoracolumbar spine and right shoulder. Electronically Signed   By: San Morelle M.D.   On: 08/16/2017 19:17    Scheduled Meds: . aspirin EC  81 mg Oral Daily  . Chlorhexidine Gluconate Cloth  6 each Topical Q0600  . enoxaparin (LOVENOX) injection  40 mg Subcutaneous Q24H  . feeding supplement (ENSURE ENLIVE)  237 mL Oral BID BM  . finasteride  5 mg Oral Daily  . guaiFENesin  600 mg Oral BID  . haloperidol  2 mg Oral Q8H  . lisinopril  20 mg Oral Daily  . mupirocin ointment  1 application Nasal BID  . sertraline  50 mg Oral Daily  . tamsulosin  0.4 mg Oral QPC supper  . traZODone  50 mg Oral QHS   Continuous Infusions: . cefTRIAXone (ROCEPHIN)  IV 2 g (08/18/17 1419)     LOS: 2 days   Marylu Lund, MD [facility] Pager [phone]  If 7PM-7AM, please contact night-coverage [email or web address] Password [facility] 08/18/2017, 2:45 PM

## 2017-08-19 LAB — CULTURE, BLOOD (ROUTINE X 2): Special Requests: ADEQUATE

## 2017-08-19 LAB — CBC
HEMATOCRIT: 35.9 % — AB (ref 39.0–52.0)
HEMOGLOBIN: 12.2 g/dL — AB (ref 13.0–17.0)
MCH: 32.7 pg (ref 26.0–34.0)
MCHC: 34 g/dL (ref 30.0–36.0)
MCV: 96.2 fL (ref 78.0–100.0)
Platelets: 244 10*3/uL (ref 150–400)
RBC: 3.73 MIL/uL — ABNORMAL LOW (ref 4.22–5.81)
RDW: 14.7 % (ref 11.5–15.5)
WBC: 8.7 10*3/uL (ref 4.0–10.5)

## 2017-08-19 LAB — BASIC METABOLIC PANEL
ANION GAP: 10 (ref 5–15)
BUN: 14 mg/dL (ref 6–20)
CALCIUM: 8.2 mg/dL — AB (ref 8.9–10.3)
CO2: 25 mmol/L (ref 22–32)
Chloride: 102 mmol/L (ref 101–111)
Creatinine, Ser: 0.45 mg/dL — ABNORMAL LOW (ref 0.61–1.24)
GFR calc Af Amer: >60 mL/min (ref >60)
Glucose, Bld: 104 mg/dL — ABNORMAL HIGH (ref 65–99)
POTASSIUM: 3.3 mmol/L — AB (ref 3.5–5.1)
Sodium: 137 mmol/L (ref 135–145)

## 2017-08-19 MED ORDER — CEFDINIR 300 MG PO CAPS: 300.0000 mg | ORAL_CAPSULE | Freq: Two times a day (BID) | ORAL | 0 refills | Status: AC

## 2017-08-19 NOTE — Care Management Note (Signed)
Case Management Note  Patient Details  Name: Joe Dawson MRN: [id number] Date of Birth: 26-Mar-1951  Subjective/Objective:  See previous NCM notes                 Action/Plan:  Received call from [facility] for HHPT/OT orders with [facility]. HH arranged with [facility]. Contacted attending.    Expected Discharge Date:  08/19/17               Expected Discharge Plan:  [place]  In-House Referral:  NA  Discharge planning Services  CM Consult  Post Acute Care Choice:  Home Health Choice offered to:  Spouse  DME Arranged:  N/A DME Agency:  NA  HH Arranged:  PT, OT HH Agency:  [facility]  Status of Service:  Completed, signed off  If discussed at [place] of Stay Meetings, dates discussed:    Additional Comments:  Erenest Rasher, RN 08/19/2017, 1:57 PM

## 2017-08-19 NOTE — Progress Notes (Signed)
Joe Dawson to be D/C'd Home per MD order.  Discussed prescriptions and follow up appointments with the patient. Prescriptions given to patient, medication list explained in detail. Pt verbalized understanding.  Allergies as of 08/19/2017      Reactions   Epinephrine    Legs shake      Medication List    TAKE these medications   acetaminophen 325 MG tablet Commonly known as:  TYLENOL Take 650 mg by mouth 2 (two) times daily.   aspirin 81 MG tablet Take 81 mg by mouth daily.   cefdinir 300 MG capsule Commonly known as:  OMNICEF Take 1 capsule (300 mg total) by mouth 2 (two) times daily for 4 days.   cholecalciferol 1000 units tablet Commonly known as:  VITAMIN D Take 1,000 Units by mouth daily.   finasteride 5 MG tablet Commonly known as:  PROSCAR Take 5 mg by mouth daily.   haloperidol 2 MG tablet Commonly known as:  HALDOL Take 2 mg by mouth 3 (three) times daily.   haloperidol 2 MG/ML solution Commonly known as:  HALDOL Take 2 mg by mouth every 8 (eight) hours.   lisinopril 20 MG tablet Commonly known as:  PRINIVIL,ZESTRIL Take 20 mg by mouth daily.   LORazepam 0.5 MG tablet Commonly known as:  ATIVAN Take 1 tablet (0.5 mg total) by mouth daily as needed for anxiety. What changed:  when to take this   saccharomyces boulardii 250 MG capsule Commonly known as:  FLORASTOR Take 250 mg by mouth daily.   sertraline 50 MG tablet Commonly known as:  ZOLOFT Take 50 mg by mouth daily.   tamsulosin 0.4 MG Caps capsule Commonly known as:  FLOMAX Take 0.4 mg by mouth daily after supper.   traZODone 50 MG tablet Commonly known as:  DESYREL Take 50 mg by mouth at bedtime.       Vitals:   08/18/17 2046 08/19/17 0533  BP: (!) 175/90 (!) 169/89  Pulse: 70 67  Resp: 20 19  Temp: 99.7 F (37.6 C) 98.5 F (36.9 C)  SpO2: 93% 96%    Skin clean, dry and intact without evidence of skin break down, no evidence of skin tears noted. IV catheter discontinued  intact. Site without signs and symptoms of complications. Dressing and pressure applied. Pt denies pain at this time. No complaints noted.  An After Visit Summary was printed and given to the patient. Patient escorted via [place], and D/C home via private auto.  Joe Dawson BSN, RN [employer] [phone]

## 2017-08-19 NOTE — Discharge Summary (Signed)
Physician Discharge Summary  Joe Dawson QIO:[id number] DOB: 14-Oct-1950 DOA: 08/16/2017  PCP: Gayland Curry, DO  Admit date: 08/16/2017 Discharge date: 08/19/2017  Admitted From: Home Disposition:  Home  Recommendations for Outpatient Follow-up:  1. Follow up with PCP in 2-3 weeks  Discharge Condition:Improved CODE STATUS:DNR Diet recommendation: Reguler   Brief/Interim Summary: 67 y.o.malewith medical history significant of hypertension, severe dementia with behavioral changes, blindness and BPH  Presented withconfusion and somnolencefor past 3 days Treated with Augmentin forpast 1 weekfor possible PNA. unable to provide much hx. poorly started to become more confused and lethargic and wife requested him to be transferred to emergency department for further evaluation patient baseline has blindness and dementia. PerReport had low grade fevers at the facility. denies any chest pain. Wife reports cough productive. Denies diarrhea.   Marland Kitchen HCAP (healthcare-associated pneumonia) - patient was admitted and started on and started on empiric vancomycin and  Empiric vancomycin and cefepime.  Cefepime. - blood cultures positive for Enterobacter and Klebsiella species.discussed with pharmacyiscussed with pharmacy. Improved on Rocephin monotherapy.  -leukocytosis normalized. Fevers resolved -Klebsiella multi-drug sensitive, only resistant to ampicillin -Will complete course of omnicef on discharge  . Tobacco abuseno longer smoking given currently in SNF -Remained stable at this time  . Dementia with behavioral disturbance-expect some decline given acute illness monitor for sundowning -Patient presents from skilled nursing level of care.  Patient's wife at bedside who requested patient to be discharged home at time of discharge with home health. -Appears stable at present  . Essential hypertension, benign-remains stable continue home medications as tolerated  . Acute  metabolic encephalopathy-- most likely multifactorial secondary to combination of infection mild dehydration secondary to decreased by mouth intake, Patient had been given IVF hydration this admit. Improved  . Benign prostatic hyperplasia - currently stable.we will continue home medications as tolerated   Discharge Diagnoses:  Active Problems:   Hyperlipidemia LDL goal <100   Blind   Benign prostatic hyperplasia   Tobacco abuse   Essential hypertension, benign   Dementia with behavioral disturbance   HCAP (healthcare-associated pneumonia)   Acute metabolic encephalopathy   Hypokalemia    Discharge Instructions   Allergies as of 08/19/2017      Reactions   Epinephrine    Legs shake      Medication List    TAKE these medications   acetaminophen 325 MG tablet Commonly known as:  TYLENOL Take 650 mg by mouth 2 (two) times daily.   aspirin 81 MG tablet Take 81 mg by mouth daily.   cefdinir 300 MG capsule Commonly known as:  OMNICEF Take 1 capsule (300 mg total) by mouth 2 (two) times daily for 4 days.   cholecalciferol 1000 units tablet Commonly known as:  VITAMIN D Take 1,000 Units by mouth daily.   finasteride 5 MG tablet Commonly known as:  PROSCAR Take 5 mg by mouth daily.   haloperidol 2 MG tablet Commonly known as:  HALDOL Take 2 mg by mouth 3 (three) times daily.   haloperidol 2 MG/ML solution Commonly known as:  HALDOL Take 2 mg by mouth every 8 (eight) hours.   lisinopril 20 MG tablet Commonly known as:  PRINIVIL,ZESTRIL Take 20 mg by mouth daily.   LORazepam 0.5 MG tablet Commonly known as:  ATIVAN Take 1 tablet (0.5 mg total) by mouth daily as needed for anxiety. What changed:  when to take this   saccharomyces boulardii 250 MG capsule Commonly known as:  FLORASTOR Take 250 mg  by mouth daily.   sertraline 50 MG tablet Commonly known as:  ZOLOFT Take 50 mg by mouth daily.   tamsulosin 0.4 MG Caps capsule Commonly known as:   FLOMAX Take 0.4 mg by mouth daily after supper.   traZODone 50 MG tablet Commonly known as:  DESYREL Take 50 mg by mouth at bedtime.      Follow-up Information    [facility]   Specialty:  Home Health Services Why:  For home health physical therapy and RN. Contact information: [place] [address] [phone]        Gayland Curry, DO. Schedule an appointment as soon as possible for a visit in 2 week(s).   Specialty:  Geriatric Medicine Contact information: [place]. [place] [address] [phone]          Allergies  Allergen Reactions  . Epinephrine     Legs shake     Procedures/Studies: Dg Chest 2 View  Result Date: 08/16/2017 CLINICAL DATA:  Increased lethargy.  Cough.  Pneumonia. EXAM: CHEST - 2 VIEW COMPARISON:  Two-view chest x-ray 05/28/2017 FINDINGS: The heart size is normal. Atherosclerotic calcifications are present at the aortic arch. Ill-defined right middle lobe airspace disease is present. Posterior right lower lobe airspace disease is noted as well. No significant left-sided disease is present. Degenerative changes in the thoracolumbar spine and right greater than left shoulders are again noted. IMPRESSION: 1. Right middle and lower lobe pneumonia. 2. Atherosclerosis. 3. Degenerative changes of the thoracolumbar spine and right shoulder. Electronically Signed   By: San Morelle M.D.   On: 08/16/2017 19:17     Subjective: Without complaints  Discharge Exam: Vitals:   08/18/17 2046 08/19/17 0533  BP: (!) 175/90 (!) 169/89  Pulse: 70 67  Resp: 20 19  Temp: 99.7 F (37.6 C) 98.5 F (36.9 C)  SpO2: 93% 96%   Vitals:   08/18/17 1213 08/18/17 1458 08/18/17 2046 08/19/17 0533  BP:  (!) 162/90 (!) 175/90 (!) 169/89  Pulse:   70 67  Resp:  (!) 22 20 19   Temp: 99.2 F (37.3 C) 99.7 F (37.6 C) 99.7 F (37.6 C) 98.5 F (36.9 C)  TempSrc: Oral Oral Oral Oral  SpO2:  96% 93%  96%  Weight:      Height:        General: Pt is alert, awake, not in acute distress Cardiovascular: RRR, S1/S2 +, no rubs, no gallops Respiratory: CTA bilaterally, no wheezing, no rhonchi Abdominal: Soft, NT, ND, bowel sounds + Extremities: no edema, no cyanosis   The results of significant diagnostics from this hospitalization (including imaging, microbiology, ancillary and laboratory) are listed below for reference.     Microbiology: Recent Results (from the past 240 hour(s))  Blood Culture (routine x 2)     Status: Abnormal   Collection Time: 08/16/17  6:10 PM  Result Value Ref Range Status   Specimen Description   Final    BLOOD LEFT FOREARM Performed at [facility] [address] [place], [place] [address]    Special Requests   Final    BOTTLES DRAWN AEROBIC AND ANAEROBIC Blood Culture results may not be optimal due to an excessive volume of blood received in culture bottles Performed at [facility] [address] [place], [place] [address]    Culture  Setup Time   Final    GRAM NEGATIVE RODS ANAEROBIC BOTTLE ONLY CRITICAL RESULT CALLED TO,  READ BACK BY AND VERIFIED WITH: Guadlupe Spanish PharmD 12:30 08/17/17 ([email or web address]) Performed at [facility], [place] [address], [place], [place] [address]    Culture KLEBSIELLA PNEUMONIAE (A)  Final   Report Status 08/19/2017 FINAL  Final   Organism ID, Bacteria KLEBSIELLA PNEUMONIAE  Final      Susceptibility   Klebsiella pneumoniae - MIC*    AMPICILLIN >=32 RESISTANT Resistant     CEFAZOLIN <=4 SENSITIVE Sensitive     CEFEPIME <=1 SENSITIVE Sensitive     CEFTAZIDIME <=1 SENSITIVE Sensitive     CEFTRIAXONE <=1 SENSITIVE Sensitive     CIPROFLOXACIN <=0.25 SENSITIVE Sensitive     GENTAMICIN <=1 SENSITIVE Sensitive     IMIPENEM <=0.25 SENSITIVE Sensitive     TRIMETH/SULFA <=20 SENSITIVE Sensitive     AMPICILLIN/SULBACTAM 8 SENSITIVE Sensitive     PIP/TAZO <=4 SENSITIVE Sensitive      Extended ESBL NEGATIVE Sensitive     * KLEBSIELLA PNEUMONIAE  Blood Culture ID Panel (Reflexed)     Status: Abnormal   Collection Time: 08/16/17  6:10 PM  Result Value Ref Range Status   Enterococcus species NOT DETECTED NOT DETECTED Final   Listeria monocytogenes NOT DETECTED NOT DETECTED Final   Staphylococcus species NOT DETECTED NOT DETECTED Final   Staphylococcus aureus NOT DETECTED NOT DETECTED Final   Streptococcus species NOT DETECTED NOT DETECTED Final   Streptococcus agalactiae NOT DETECTED NOT DETECTED Final   Streptococcus pneumoniae NOT DETECTED NOT DETECTED Final   Streptococcus pyogenes NOT DETECTED NOT DETECTED Final   Acinetobacter baumannii NOT DETECTED NOT DETECTED Final   Enterobacteriaceae species DETECTED (A) NOT DETECTED Final    Comment: Enterobacteriaceae represent a large family of gram-negative bacteria, not a single organism. CRITICAL RESULT CALLED TO, READ BACK BY AND VERIFIED WITH: Guadlupe Spanish PharmD 12:30 08/17/17 ([email or web address])    Enterobacter cloacae complex NOT DETECTED NOT DETECTED Final   Escherichia coli NOT DETECTED NOT DETECTED Final   Klebsiella oxytoca NOT DETECTED NOT DETECTED Final   Klebsiella pneumoniae DETECTED (A) NOT DETECTED Final    Comment: CRITICAL RESULT CALLED TO, READ BACK BY AND VERIFIED WITH: Guadlupe Spanish PharmD 12:30 08/17/17 ([email or web address])    Proteus species NOT DETECTED NOT DETECTED Final   Serratia marcescens NOT DETECTED NOT DETECTED Final   Carbapenem resistance NOT DETECTED NOT DETECTED Final   Haemophilus influenzae NOT DETECTED NOT DETECTED Final   Neisseria meningitidis NOT DETECTED NOT DETECTED Final   Pseudomonas aeruginosa NOT DETECTED NOT DETECTED Final   Candida albicans NOT DETECTED NOT DETECTED Final   Candida glabrata NOT DETECTED NOT DETECTED Final   Candida krusei NOT DETECTED NOT DETECTED Final   Candida parapsilosis NOT DETECTED NOT DETECTED Final   Candida tropicalis NOT DETECTED NOT DETECTED Final    Comment:  Performed at [facility], [place] [address] [place], [place] [address]  Blood Culture (routine x 2)     Status: Abnormal   Collection Time: 08/16/17  6:27 PM  Result Value Ref Range Status   Specimen Description   Final    BLOOD RIGHT FOREARM Performed at [facility] [address] [place], [place] [address]    Special Requests   Final    BOTTLES DRAWN AEROBIC AND ANAEROBIC Blood Culture adequate volume Performed at [facility] [address] [place], [place] [address]    Culture  Setup Time   Final    GRAM NEGATIVE RODS IN BOTH AEROBIC AND ANAEROBIC BOTTLES CRITICAL VALUE NOTED.  VALUE IS CONSISTENT WITH PREVIOUSLY REPORTED AND CALLED VALUE.    Culture (A)  Final    KLEBSIELLA PNEUMONIAE SUSCEPTIBILITIES PERFORMED ON PREVIOUS CULTURE WITHIN THE LAST 5 DAYS. Performed at [facility], [place] [address], [place], [place] [address]    Report Status 08/19/2017 FINAL  Final  MRSA PCR Screening     Status: Abnormal   Collection Time: 08/16/17 10:13 PM  Result Value Ref Range Status   MRSA by PCR POSITIVE (A) NEGATIVE Final    Comment:        The GeneXpert MRSA Assay (FDA approved for NASAL specimens only), is one component of a comprehensive MRSA colonization surveillance program. It is not intended to diagnose MRSA infection nor to guide or monitor treatment for MRSA infections. RESULT CALLED TO, READ BACK BY AND VERIFIED WITH: GRANDY,B RN 3.28.19 @0025  ZANDO,C Performed at [facility] [address], [place], [place] [address]      Labs: BNP (last 3 results) No results for input(s): BNP in the last 8760 hours. Basic Metabolic Panel: Recent Labs  Lab 08/16/17 1810 08/16/17 1817 08/17/17 0505 08/18/17 0549 08/19/17 0541  NA 140  --  137 137 137  K 3.1*  --  3.3* 3.2* 3.3*  CL 103  --  104 103 102  CO2 26  --  24 25 25   GLUCOSE 122*  --  103* 112* 104*  BUN 21*  --  15 12 14   CREATININE  0.60*  --  0.59* 0.52* 0.45*  CALCIUM 9.0  --  8.2* 8.3* 8.2*  MG  --  1.9 1.8  --   --   PHOS  --  2.4* 2.2*  --   --    Liver Function Tests: Recent Labs  Lab 08/16/17 1810 08/17/17 0505  AST 18 14*  ALT 19 16*  ALKPHOS 63 57  BILITOT 0.9 0.9  PROT 7.1 6.1*  ALBUMIN 2.9* 2.4*   No results for input(s): LIPASE, AMYLASE in the last 168 hours. No results for input(s): AMMONIA in the last 168 hours. CBC: Recent Labs  Lab 08/16/17 1810 08/17/17 0505 08/18/17 0549 08/19/17 0541  WBC 20.9* 13.4* 9.8 8.7  NEUTROABS 18.5*  --   --   --   HGB 12.4* 11.4* 12.3* 12.2*  HCT 35.9* 34.1* 35.8* 35.9*  MCV 96.2 98.6 96.0 96.2  PLT 282 251 252 244   Cardiac Enzymes: No results for input(s): CKTOTAL, CKMB, CKMBINDEX, TROPONINI in the last 168 hours. BNP: Invalid input(s): POCBNP CBG: No results for input(s): GLUCAP in the last 168 hours. D-Dimer No results for input(s): DDIMER in the last 72 hours. Hgb A1c No results for input(s): HGBA1C in the last 72 hours. Lipid Profile No results for input(s): CHOL, HDL, LDLCALC, TRIG, CHOLHDL, LDLDIRECT in the last 72 hours. Thyroid function studies Recent Labs    08/17/17 0505  TSH 0.318*   Anemia work up No results for input(s): VITAMINB12, FOLATE, FERRITIN, TIBC, IRON, RETICCTPCT in the last 72 hours. Urinalysis    Component Value Date/Time   COLORURINE YELLOW 08/16/2017 1827   APPEARANCEUR CLOUDY (A) 08/16/2017 1827   LABSPEC 1.020 08/16/2017 1827   PHURINE 6.0 08/16/2017 1827   GLUCOSEU NEGATIVE 08/16/2017 1827   HGBUR MODERATE (A) 08/16/2017 1827   BILIRUBINUR NEGATIVE 08/16/2017 1827   KETONESUR 20 (A) 08/16/2017 1827   PROTEINUR 100 (A) 08/16/2017 1827   NITRITE POSITIVE (A) 08/16/2017 1827   LEUKOCYTESUR MODERATE (A) 08/16/2017 1827   Sepsis Labs Invalid  input(s): PROCALCITONIN,  WBC,  LACTICIDVEN Microbiology Recent Results (from the past 240 hour(s))  Blood Culture (routine x 2)     Status: Abnormal   Collection  Time: 08/16/17  6:10 PM  Result Value Ref Range Status   Specimen Description   Final    BLOOD LEFT FOREARM Performed at [facility] [address] [place], [place] [address]    Special Requests   Final    BOTTLES DRAWN AEROBIC AND ANAEROBIC Blood Culture results may not be optimal due to an excessive volume of blood received in culture bottles Performed at [facility] [address] [place], [place] [address]    Culture  Setup Time   Final    GRAM NEGATIVE RODS ANAEROBIC BOTTLE ONLY CRITICAL RESULT CALLED TO, READ BACK BY AND VERIFIED WITH: Guadlupe Spanish PharmD 12:30 08/17/17 ([email or web address]) Performed at [facility], [address] [place], [place] [address]    Culture KLEBSIELLA PNEUMONIAE (A)  Final   Report Status 08/19/2017 FINAL  Final   Organism ID, Bacteria KLEBSIELLA PNEUMONIAE  Final      Susceptibility   Klebsiella pneumoniae - MIC*    AMPICILLIN >=32 RESISTANT Resistant     CEFAZOLIN <=4 SENSITIVE Sensitive     CEFEPIME <=1 SENSITIVE Sensitive     CEFTAZIDIME <=1 SENSITIVE Sensitive     CEFTRIAXONE <=1 SENSITIVE Sensitive     CIPROFLOXACIN <=0.25 SENSITIVE Sensitive     GENTAMICIN <=1 SENSITIVE Sensitive     IMIPENEM <=0.25 SENSITIVE Sensitive     TRIMETH/SULFA <=20 SENSITIVE Sensitive     AMPICILLIN/SULBACTAM 8 SENSITIVE Sensitive     PIP/TAZO <=4 SENSITIVE Sensitive     Extended ESBL NEGATIVE Sensitive     * KLEBSIELLA PNEUMONIAE  Blood Culture ID Panel (Reflexed)     Status: Abnormal   Collection Time: 08/16/17  6:10 PM  Result Value Ref Range Status   Enterococcus species NOT DETECTED NOT DETECTED Final   Listeria monocytogenes NOT DETECTED NOT DETECTED Final   Staphylococcus species NOT DETECTED NOT DETECTED Final   Staphylococcus aureus NOT DETECTED NOT DETECTED Final   Streptococcus species NOT DETECTED NOT DETECTED Final   Streptococcus agalactiae NOT DETECTED NOT DETECTED Final   Streptococcus pneumoniae NOT  DETECTED NOT DETECTED Final   Streptococcus pyogenes NOT DETECTED NOT DETECTED Final   Acinetobacter baumannii NOT DETECTED NOT DETECTED Final   Enterobacteriaceae species DETECTED (A) NOT DETECTED Final    Comment: Enterobacteriaceae represent a large family of gram-negative bacteria, not a single organism. CRITICAL RESULT CALLED TO, READ BACK BY AND VERIFIED WITH: Guadlupe Spanish PharmD 12:30 08/17/17 ([email or web address])    Enterobacter cloacae complex NOT DETECTED NOT DETECTED Final   Escherichia coli NOT DETECTED NOT DETECTED Final   Klebsiella oxytoca NOT DETECTED NOT DETECTED Final   Klebsiella pneumoniae DETECTED (A) NOT DETECTED Final    Comment: CRITICAL RESULT CALLED TO, READ BACK BY AND VERIFIED WITH: Guadlupe Spanish PharmD 12:30 08/17/17 ([email or web address])    Proteus species NOT DETECTED NOT DETECTED Final   Serratia marcescens NOT DETECTED NOT DETECTED Final   Carbapenem resistance NOT DETECTED NOT DETECTED Final   Haemophilus influenzae NOT DETECTED NOT DETECTED Final   Neisseria meningitidis NOT DETECTED NOT DETECTED Final   Pseudomonas aeruginosa NOT DETECTED NOT DETECTED Final   Candida albicans NOT DETECTED NOT DETECTED Final   Candida glabrata NOT DETECTED NOT DETECTED Final   Candida krusei NOT DETECTED NOT DETECTED Final   Candida parapsilosis NOT DETECTED NOT DETECTED Final  Candida tropicalis NOT DETECTED NOT DETECTED Final    Comment: Performed at [facility], [place] [address], [place], [place] [address]  Blood Culture (routine x 2)     Status: Abnormal   Collection Time: 08/16/17  6:27 PM  Result Value Ref Range Status   Specimen Description   Final    BLOOD RIGHT FOREARM Performed at [facility] [address], [place], [place] [address]    Special Requests   Final    BOTTLES DRAWN AEROBIC AND ANAEROBIC Blood Culture adequate volume Performed at [facility] [address] [place], [place] [address]    Culture  Setup Time   Final     GRAM NEGATIVE RODS IN BOTH AEROBIC AND ANAEROBIC BOTTLES CRITICAL VALUE NOTED.  VALUE IS CONSISTENT WITH PREVIOUSLY REPORTED AND CALLED VALUE.    Culture (A)  Final    KLEBSIELLA PNEUMONIAE SUSCEPTIBILITIES PERFORMED ON PREVIOUS CULTURE WITHIN THE LAST 5 DAYS. Performed at [facility], [place] [address], [place], [place] [address]    Report Status 08/19/2017 FINAL  Final  MRSA PCR Screening     Status: Abnormal   Collection Time: 08/16/17 10:13 PM  Result Value Ref Range Status   MRSA by PCR POSITIVE (A) NEGATIVE Final    Comment:        The GeneXpert MRSA Assay (FDA approved for NASAL specimens only), is one component of a comprehensive MRSA colonization surveillance program. It is not intended to diagnose MRSA infection nor to guide or monitor treatment for MRSA infections. RESULT CALLED TO, READ BACK BY AND VERIFIED WITH: GRANDY,B RN 3.28.19 @0025  ZANDO,C Performed at [facility], [place] [address] [place] [address], [place], [place] [address]      SIGNED:   Marylu Lund, MD  [facility] 08/19/2017, 1:11 PM  If 7PM-7AM, please contact night-coverage [email or web address] Password [email or web address]

## 2017-08-21 ENCOUNTER — Telehealth: Payer: Self-pay

## 2017-08-21 ENCOUNTER — Telehealth: Payer: Self-pay | Admitting: *Deleted

## 2017-08-21 NOTE — Telephone Encounter (Signed)
Spoke with wife and she stated pt is at home with her for now, pt is trying to get home health per the wife for at least 3 days per week. appt scheduled for 09/07/17.

## 2017-08-21 NOTE — Telephone Encounter (Signed)
Barney, thanks.  I hope things go well at home.

## 2017-08-21 NOTE — Telephone Encounter (Signed)
Called in error.    .

## 2017-08-21 NOTE — Telephone Encounter (Signed)
Gayland Curry, DO  Despina Hidden, CMA        Please call Mrs. Pearlie Lafosse and find out if patient did go home with her or back to the facility where he'd been after his behavioral health discharge?  She was saying he was going to be staying in a facility for long-term care when I last saw her. He then became ill and was hospitalized again and discharge summary suggests he went back home with his wife.   TReed

## 2017-08-21 NOTE — Telephone Encounter (Signed)
Transition Care Management Follow-Up Telephone Call   Date discharged and where:  How have you been since you were released from the hospital?   Any patient concerns?   Items Reviewed:   Meds:   Allergies:  Dietary Changes Reviewed:  Functional Questionnaire:  Independent-I Dependent-D  ADLs:   Dressing-     Eating-   Maintaining continence-   Transferring-   Transportation-   Meal Prep-   Managing Meds-   Confirmed importance and Date/Time of follow-up visits scheduled:    Confirmed with patient if condition worsens to call PCP or go to the Emergency Dept. Patient was given office number and encouraged to call back with questions or concerns:    

## 2017-08-21 NOTE — Telephone Encounter (Signed)
Transition Care Management Follow-Up Telephone Call   Date discharged and where: [facility] on 08/19/2017  How have you been since you were released from the hospital? Pretty well per caregiver. No temperature and on oral abx for pneumonia. They are addressing toileting, bathing and respite care needs.  Any patient concerns? None  Items Reviewed:   Meds: Y  Allergies: Y  Dietary Changes Reviewed: Y  Functional Questionnaire:  Independent-I Dependent-D  ADLs:   Dressing- I w/ assist    Eating- I   Maintaining continence- D, Wears depends   Transferring-chairlift, wheelchair, walker   Transportation- D   Meal Prep- D   Managing Meds- D  Confirmed importance and Date/Time of follow-up visits scheduled: Yes, 09/07/2017 @ 3pm Dr. Mariea Clonts   Confirmed with patient if condition worsens to call PCP or go to the Emergency Dept. Patient was given office number and encouraged to call back with questions or concerns: Yes

## 2017-08-22 ENCOUNTER — Telehealth: Payer: Self-pay | Admitting: *Deleted

## 2017-08-22 DIAGNOSIS — N400 Enlarged prostate without lower urinary tract symptoms: Secondary | ICD-10-CM | POA: Diagnosis not present

## 2017-08-22 DIAGNOSIS — I10 Essential (primary) hypertension: Secondary | ICD-10-CM | POA: Diagnosis not present

## 2017-08-22 DIAGNOSIS — N390 Urinary tract infection, site not specified: Secondary | ICD-10-CM | POA: Diagnosis not present

## 2017-08-22 DIAGNOSIS — F0391 Unspecified dementia with behavioral disturbance: Secondary | ICD-10-CM | POA: Diagnosis not present

## 2017-08-22 DIAGNOSIS — J189 Pneumonia, unspecified organism: Secondary | ICD-10-CM | POA: Diagnosis not present

## 2017-08-22 DIAGNOSIS — Z87891 Personal history of nicotine dependence: Secondary | ICD-10-CM | POA: Diagnosis not present

## 2017-08-22 DIAGNOSIS — H548 Legal blindness, as defined in USA: Secondary | ICD-10-CM | POA: Diagnosis not present

## 2017-08-22 DIAGNOSIS — B961 Klebsiella pneumoniae [K. pneumoniae] as the cause of diseases classified elsewhere: Secondary | ICD-10-CM | POA: Diagnosis not present

## 2017-08-22 NOTE — Telephone Encounter (Signed)
Joe Dawson with [facility] called requesting verbal orders for Home Health PT 2X3wks. Is this ok to give verbal. Please Advise.

## 2017-08-22 NOTE — Telephone Encounter (Signed)
Ok to approve home health orders.

## 2017-08-23 NOTE — Telephone Encounter (Signed)
Erline Levine, Nurse notified and agreed.

## 2017-08-31 ENCOUNTER — Telehealth: Payer: Self-pay | Admitting: *Deleted

## 2017-08-31 NOTE — Telephone Encounter (Signed)
Noted.  He will see me next week.  If he has any change in condition, they should call us back

## 2017-08-31 NOTE — Telephone Encounter (Signed)
Call from [facility], Physical Therapy was out to see pt today and per Physical Therapy pt fell off the toilet, she accessed pt and he only had a small cut to his right cheek.

## 2017-09-01 NOTE — Telephone Encounter (Signed)
.  left message to have patient return my call.  

## 2017-09-07 ENCOUNTER — Encounter: Payer: Self-pay | Admitting: Internal Medicine

## 2017-09-11 ENCOUNTER — Telehealth: Payer: Self-pay | Admitting: *Deleted

## 2017-09-11 NOTE — Telephone Encounter (Signed)
Joe Dawson with [facility] called and requested a verbal order to continue PT. Verbal order given.

## 2017-09-12 ENCOUNTER — Telehealth: Payer: Self-pay | Admitting: *Deleted

## 2017-09-12 NOTE — Telephone Encounter (Signed)
Esther with [facility] called and requested verbal order for OT 2X3wks and a Education officer, museum. Verbal orders given.

## 2017-09-13 ENCOUNTER — Other Ambulatory Visit: Payer: Self-pay | Admitting: Internal Medicine

## 2017-09-15 ENCOUNTER — Other Ambulatory Visit: Payer: Self-pay | Admitting: *Deleted

## 2017-09-15 ENCOUNTER — Encounter: Payer: Self-pay | Admitting: *Deleted

## 2017-09-15 NOTE — Patient Outreach (Signed)
[facility] High Risk Screening call. I spoke with Mrs. Kristion today, Mr. Ariel has Alzheimer's and is unable to communicate for this assessment. Mrs. Kreed was in the grocery so I only spoke to her for a few minutes. She says she is taking care of Mr. Antwann at home now and she hopes she can continue to do so. She has a family member who assists one day a week and a privately paid caregiver that comes two times a week for 4 hours each time. [facility].  I advised Mrs. Garrick, she should look into the [facility] program of [facility] as an option if 24/7 caregiving becomes too overwhelming. This is a good option if she desires for him to stay at home part of the time. Once this option is exhausted and she can no longer care for him at home then they can consider a permanent placement.  She was appreciative of this information and I will send her our information for future reference.  Eulah Pont. Myrtie Neither, MSN, [facility] Gerontological Nurse Practitioner [facility] Care Management [phone]

## 2017-09-19 ENCOUNTER — Telehealth: Payer: Self-pay | Admitting: *Deleted

## 2017-09-19 NOTE — Telephone Encounter (Signed)
It's very hard for me to advise on this since I have not seen him since before his first hospitalization for his dementia.  I was shocked he went back home with her after the previous hospitalizations.  I'm sorry she is struggling with this. The sooner I can see him the better, but I also do not want either of them getting injured trying to transport him.  Perhaps home health can give her an idea of when he's safe to be transported?

## 2017-09-19 NOTE — Telephone Encounter (Signed)
Wife called and stated that patient has an appointment on Thursday and she cannot get him here due to limited mobility. Has Home Health 4 times a week coming to his home to help with strength and checking his vitals. Stated things are getting better but he is not walking at all right now. She thought her brother was going to be able to help her but he can't come down to help and she thinks it is too dangerous for her to do it by herself. She feels like patient is getting better and stronger.   Wife is wanting to know how long is it ok to wait to bring patient in, so he can build up his strength. She does not have a way to get him into the car at this time and canceled Thursday appointment. Please Advise.

## 2017-09-20 NOTE — Telephone Encounter (Signed)
Records have been received from [facility]. These have been abstracted and placed in provider's folder for review. No upcoming appointments.

## 2017-09-20 NOTE — Telephone Encounter (Signed)
Patient wife notified and agreed. Stated she will give [place] a call back to schedule appointment.

## 2017-09-21 ENCOUNTER — Encounter: Payer: Self-pay | Admitting: Internal Medicine

## 2017-10-07 ENCOUNTER — Other Ambulatory Visit: Payer: Self-pay | Admitting: Internal Medicine

## 2017-10-09 NOTE — Telephone Encounter (Signed)
Spoke with wife and she stated she can't bring pt in because he requires 24hour care. Wife stated Dr. Mariea Clonts filled his prescriptions last time and that she could tell me what he's taking over the phone. I advised that's not good care and Dr. Mariea Clonts would need to see him. Wife states that's the best she can do, please advise

## 2017-10-11 ENCOUNTER — Other Ambulatory Visit: Payer: Self-pay | Admitting: *Deleted

## 2017-10-11 NOTE — Patient Outreach (Signed)
Follow up telephone call for HTA High Risk Patient. I spoke with [name], pt's wife this am. She reports she did get the information that I sent and she and Tavish talked about the PACE like program in [place] but he did not want to do it. She continues to struggle to care for him, but he is so afraid of being placed again. She is doing the best that she can.  Today she says he is running a temperature and he has become more confused. She is thinking of calling for an ambulance and take him to the hospital. I asked if she can get him to the MD office and she says she cannot, he has to go by ambulance because of his behaviors and inabilility to walk. I asked if she could collect a urine specimen and take it to his MD office and she said she could. I encouraged her to do so to avoid him having to go to the hospital.   I was able to get her to agree to participate in our care management program. I am sending a referral for a [employer] today.  Eulah Pont. Myrtie Neither, MSN, [facility] Gerontological Nurse Practitioner [facility] Care Management [phone]

## 2017-10-13 ENCOUNTER — Encounter: Payer: Self-pay | Admitting: *Deleted

## 2017-10-13 ENCOUNTER — Telehealth: Payer: Self-pay | Admitting: *Deleted

## 2017-10-13 ENCOUNTER — Other Ambulatory Visit: Payer: Self-pay | Admitting: *Deleted

## 2017-10-13 NOTE — Patient Outreach (Signed)
[facility]) Care Management [place] Telephone Outreach  10/13/2017  Joe Dawson 11-07-1950 [id number]  Successful telephone outreach to Joe Dawson, spouse/ caregiver for Joe Dawson, 67 y/o male referred to [place] on 10/11/17 by insurance plan; patient has had multiple hospitalizations and ED visits.  Patient has history including, but not limited to, bilateral eye legal blindness; bilateral hearing loss; severe dementia with behavioral disturbance; BPH; HTN/ HLD.  HIPAA/ Identity verified with patient's caregiver/ spouse today.  [place] services were discussed with caregiver, who provides verbal consent for [facility] CCM inolvement in patent's care.  Today, [name] reports that she "is trying very hard" to take care of patient at home, but "is struggling;" states that she has limited assistance from family members as allowed and from paid in-home caregivers which sit with patient twice weekly for 4 hours at a time so she can run errands, and she states that this "is working pretty good;" but adds that "she could use as much help as she can get.'  Reports patient "is never alone, and has someone with him 24-7."  [name] denies that patient is in pain/ distress.  Caregiver further reports:  -- "Biggest issue" is obtaining patient's medications-- reports that she is unable to physically take patient to his PCP due to limited mobility/ use of wheelchair at all times; states that she would get patient to PCP "if she could," but reports that she can not manage getting patient in and out of vehicle due to her own limitations.  [name] verbalizes that she fears that patient is going to eventually run out of his medications because he can not get to PCP to have them renewed and refilled.  -- denies concerns around having enough medications at present-- concern is for the "future."  Reports that she manages patient's medications for him, and administers to him; states patient is  currently able to swallow medications once they are administered; declined medication review review today, stating time constraints, as patient needs care assistance in getting into and out of bathroom during our phone call.  -- States that patient requires "pretty much total care," and assistance with all care and hygiene needs; occasionally patient is able to feed self, if it is finger food; otherwise requires assistance in feeding, bathing, dressing, tolieting.  Reports patient went to short-term SNF for rehabilitation after a recent hospitalization, and patient and caregiver both wished for patient to return home, as they believe patient has more attention at home  -- reports was running a 'slight fever" last week, that is currently resolved  -- Advanced Directive Planning:  Reports patient has current HCPOA Engineer, materials, wife), a living will, and posted DNR/ MOST in home; declines desire to make changes  -- Mobility issues including that patient is unable to ambulate, but can stand and is only able to pivot from bed to wheelchair; otherwise wheelchair bound  Discussed possibility of my placing a [facility] CSW referral with caregiver, who at first was hesistant, stating that she had previously spoken with a home health CSW, "who only told me what she could NOT do-- she never told me anything she COULD do around helping with transportation; nothing came of talking to her."  Encouraged her to give [facility] CSW a chance to speak with her, and she eventually agreed to do so.  We also discussed possibility of placing referral to [facility] Palliative care team through [place], and caregiver was very interested in this  option-- caregiver provided request that I place this referral as soon as possible.  Patient's caregiver denies further issues, concerns, or problems today, and states that she needs to assist patient in toileting, so our conversation was completed.  I provided/ confirmed that caregiver  has my direct phone number, the main [facility] CM office phone number, and the [facility] CM 24-hour nurse advice phone number should issues arise prior to next scheduled [place] outreach, which we agreed would be in 2 weeks.  Encouraged caregiver to contact me directly if needs, questions, issues, or concerns arise prior to next scheduled outreach; she agreed to do so.  Plan:  I will make patient's PCP aware of [facility] Community CM involvement in patient's care-- will send barriers letter  I will place [facility] CSW and [facility] referral today, and will notify patient's PCP of both  Patient will take medications as prescribed  Caregiver will promptly notify care providers of any concerns/ issues/ problems that arise  [facility] Community CM outreach to continue with scheduled call in 2 weeks  [facility] CM Care Plan Problem One     Most Recent Value  Care Plan Problem One  Need for patient care assistance and community resources at home in patient with dementia, as evidenced by caregiver reporting  Role Documenting the Problem One  Care Management [place] for Problem One  Active  [facility] Long Term Goal   Over the next 60 days, patient's caregiver will be able to verbalize plan for patient care support at home, as evidenced by caregiver reporting during [place] RN CM outreach  [place] Term Goal Start Date  10/13/17  Interventions for Problem One Long Term Goal  Discussed with patient's caregiver current home situation and needs for patient and caregiver resources/ support,  discussed possibility of placing [facility] CSW referral and referral to [facility] Palliative Care program, and placed referrals for both today  [facility] CM Short Term Goal #1   Over the next 14 days, patient's caregiver will speak to [facility] CSW regarding need for transportation and in-home care assistance resources and support, as evidenced by caregiver reporting and collaboration with [facility] CSW as indicated  [facility] CM Short Term Goal #1  Start Date  10/13/17  Interventions for Short Term Goal #1  Discussed role of [facility] CSW in identifying resources for transportation and care assistance options at home and placed [facility] CSW referral  [facility] CM Short Term Goal #2   Over the 14 days, patient's caregiver will speak to [place] team to discuss their program, as evidenced by caregiver reporting and collaboration with care Connections team, as indicated  [facility] CM Short Term Goal #2 Start Date  10/13/17  Interventions for Short Term Goal #2  Discussed [facility] Palliative Care program with patient's caregiver and placed referral today for same,  discussed difference between palliative care and hospice with caregiver     I appreciate the opportunity to participate in Uzziel's care,  [name], RN, BSN, [employer] Coordinator [facility] Care Management  [phone]

## 2017-10-13 NOTE — Telephone Encounter (Signed)
[facility] nurse calling stating that she seen patient today and they have put in a palliative care referral and just wanted you to be aware.

## 2017-10-13 NOTE — Patient Outreach (Signed)
[facility] [facility]) Care Management [place] Telephone Outreach Care Coordination  10/13/2017  Joe Dawson 1950/11/30 [id number]  Successful telephone outreach for care coordination to "[facility]" with Dr. Hollace Kinnier, PCP [phone]) for/ re: Joe Dawson, 67 y/o male referred to [place] on 10/11/17 by insurance plan; patient has had multiple hospitalizations and ED visits.  Patient has history including, but not limited to, bilateral eye legal blindness; bilateral hearing loss; severe dementia with behavioral disturbance; BPH; HTN/ HLD.    Discussed patient's and caregiver's current barriers with [facility], and explained that I had placed referral today for [facility] Palliative care team through [place]; explained that this program is a community based program that can visit patient at his home (nursing, NP, MD) and work with his PCP to assist with care needs/ resources.  Encouraged PCP or staff to consider and to contact me as indicated regarding referral placed today.  Plan:  Will collaborate with patient's PCP as indicated to facilitate patient's care needs  [name], RN, BSN, [place] Care Management  [phone]

## 2017-10-13 NOTE — Telephone Encounter (Signed)
I suppose that's appropriate.  I haven't seen him so hard to know.

## 2017-10-13 NOTE — Patient Outreach (Signed)
[place] [facility]) Care Management [place] Telephone [facility] Coordination  10/13/2017  Jaice EFREN KROSS 01-12-1951 [id number]  2:55 pm:  Care Coordination telephone outreach placed to "Marcelino Scot" at [place] through [place] [phone]) re:  Joe Dawson, 67 y/o male referred to [place] on 10/11/17 by insurance plan; patient has had multiple hospitalizations and ED visits.  Patient has history including, but not limited to, bilateral eye legal blindness; bilateral hearing loss; severe dementia with behavioral disturbance; BPH; HTN/ HLD.    Marcelino Scot reported that care Connections staff were currently all unavailable, but took message, requesting call back.  3:25 pm:  Received call back from Ronnell Guadalajara with [facility]; discussed patient referral, and confirmed with Manus Gunning that I would also notify patient's PCP regarding this referral.  Plan:  Will collaborate as indicated with [facility] Palliative care team  [name], RN, BSN, [place] Care Management  [phone]

## 2017-10-17 ENCOUNTER — Telehealth: Payer: Self-pay | Admitting: *Deleted

## 2017-10-17 ENCOUNTER — Other Ambulatory Visit: Payer: Self-pay

## 2017-10-17 NOTE — Telephone Encounter (Signed)
She will be sending paperwork to be signed.

## 2017-10-17 NOTE — Telephone Encounter (Signed)
Manus Gunning with [facility] called and stated that patient was referred by [facility] for Palliative Care, [facility]. They are wanting to know if you will be the attending. Please Advise.

## 2017-10-17 NOTE — Patient Outreach (Signed)
[place] [facility]) Care Management  10/17/2017  Dallis MARCOANTONIO LEGAULT Sep 07, 1950 [id number]  BSW has contacted the following and left voice messages request a return call:  Michel Harrow with [facility], Independent Living Coordinator [phone]  Oliver Pila, CAP/DA coordinator [phone]  Elby Showers, [place] worker for the blind [phone]  BSW to outreach resources in the next week if no return call received to discuss patient needs for an in-home aide and determine program eligibility.  Daneen Schick, Arita Miss, CDP Social Worker [phone]

## 2017-10-17 NOTE — Patient Outreach (Signed)
[place] [facility]) Care Management  10/17/2017  Joe Dawson 1950-08-03 [id number]   BSW contacted patients wife, Argie Applegate, to follow up on social work referral for patient transportation and needs for an in-home aide. Mrs. Sox was able to state HIPAA identifiers of patient. Patient unable to participate in discussion with this BSW due to recent dementia diagnosis and cognitive decline.   Mrs. Hoak able to communicate that patient has had several hospitalizations since January and indicated a "big decline" since April. During the call BSW discussed patients cognitive status. Mrs. Amer stated "He does not have Alzheimer's. He is very lucid, he knows who I am". Mrs. Hinde continued to inform BSW that patient had a UTI which caused dementia. Although Mrs. Winchell states patient's dementia is linked to UTI, she denies patients cognition returning to baseline since the infection was resolved.   BSW spoke with patients wife about transportation resources within [employer]. BSW also informed Mrs. Korver that [place] Management will be able to assist in arranging transportation for patient to be seen by his primary physician, Dr. Mariea Clonts. Mrs. Summerson denied needing transportation due to recent referral to [facility].   Mrs. Habib indicated that patient responds better in the home and she felt it is best for him to be seen by [facility] who can collaborate with Dr. Mariea Clonts. BSW provided Mrs. Huebert with contact information in the event transportation may be needed in the future. Mrs. Griffie aware that due to patients ambulation status and need for wheelchair transport, [facility] Care Management will need advanced notice of at least 3 days prior to arranging transportation.  Patient currently needs assistance with all ADL's. Patient is capable of brushing own teeth and bathing with limited assistance and set-up. Patient requires total assistance with transfers, toileting, and feeding.  Patient currently has an in-home aide who assists with care on Wednesday and Friday for 4 hours each day. Mrs. Sayler's brother assists on Monday's by performing yard work and sitting with patient. Mrs. Catalfamo is interested in more in-home assistance but unable to afford more private-pay hours.  Patient applied for Medicaid in February and was denied due to income and assets. Mrs. Borunda states patient was approved for assistance through Medicaid if patient were to move into an ALF. Mrs. Krauser indicates the goal is to provide care at home for patient. Patient had a bad experience at a rehab center and not willing to move somewhere else. Patients wife agrees he needs to stay home.  BSW introduced idea of patient attending an adult day center but due to patients vision impairment Mrs. Chio feels he would feel lost and not benefit from any services. BSW spoke to Mrs. Hemmer about [employer] and suggested she contact them for caregiver support. Mrs. Matousek interested in learning more.   Plan: BSW to reach out to other community agencies to inquire possible services/eligibility BSW to mail patients wife information on [employer] [place] BSW to contact Mrs. Hoogendoorn in the next two weeks with updates.  Daneen Schick, Arita Miss, CDP Social Worker [phone]

## 2017-10-18 ENCOUNTER — Other Ambulatory Visit: Payer: Self-pay

## 2017-10-18 NOTE — Patient Outreach (Signed)
[place] [facility]) Care Management  10/18/2017  Eduard OAKES MCCREADY [birth year] [id number]  BSW recevied a return call from both [facility] and [place]. Oliver Pila, CAP/DA coordinator informed this BSW that patient is over the income limit to qualify for this program. Baird Kay, [facility] Social Worker for the Blind informed this BSW that they do not have any resources to assist with in-home aide services. Reuben Likes offered this BSW contact information to private pay agencies in the event patient and patients wife are able to afford more care through private pay.  BSW awaiting return call from Michel Harrow, [facility] Independent Living Coordinator. BSW will plan to follow-up with patients wife as previously planned.  Daneen Schick, Arita Miss, CDP Social Worker [phone]

## 2017-10-18 NOTE — Telephone Encounter (Signed)
Yes I will

## 2017-10-19 NOTE — Telephone Encounter (Signed)
Fran Notified and agreed.  ?

## 2017-10-23 ENCOUNTER — Emergency Department (HOSPITAL_COMMUNITY): Payer: PPO

## 2017-10-23 ENCOUNTER — Inpatient Hospital Stay (HOSPITAL_COMMUNITY): Payer: PPO

## 2017-10-23 ENCOUNTER — Other Ambulatory Visit: Payer: Self-pay | Admitting: *Deleted

## 2017-10-23 ENCOUNTER — Encounter (HOSPITAL_COMMUNITY): Payer: Self-pay

## 2017-10-23 ENCOUNTER — Inpatient Hospital Stay (HOSPITAL_COMMUNITY)
Admission: EM | Admit: 2017-10-23 | Discharge: 2017-10-26 | DRG: 871 | Disposition: A | Discharge status: Home Health | Payer: PPO | Attending: Internal Medicine | Admitting: Internal Medicine

## 2017-10-23 DIAGNOSIS — Z809 Family history of malignant neoplasm, unspecified: Secondary | ICD-10-CM

## 2017-10-23 DIAGNOSIS — R05 Cough: Secondary | ICD-10-CM | POA: Diagnosis not present

## 2017-10-23 DIAGNOSIS — N400 Enlarged prostate without lower urinary tract symptoms: Secondary | ICD-10-CM | POA: Diagnosis present

## 2017-10-23 DIAGNOSIS — K8309 Other cholangitis: Secondary | ICD-10-CM | POA: Diagnosis not present

## 2017-10-23 DIAGNOSIS — I959 Hypotension, unspecified: Secondary | ICD-10-CM

## 2017-10-23 DIAGNOSIS — Z6820 Body mass index (BMI) 20.0-20.9, adult: Secondary | ICD-10-CM | POA: Diagnosis not present

## 2017-10-23 DIAGNOSIS — A419 Sepsis, unspecified organism: Secondary | ICD-10-CM | POA: Diagnosis present

## 2017-10-23 DIAGNOSIS — Z888 Allergy status to other drugs, medicaments and biological substances status: Secondary | ICD-10-CM

## 2017-10-23 DIAGNOSIS — R740 Nonspecific elevation of levels of transaminase and lactic acid dehydrogenase [LDH]: Secondary | ICD-10-CM

## 2017-10-23 DIAGNOSIS — B961 Klebsiella pneumoniae [K. pneumoniae] as the cause of diseases classified elsewhere: Secondary | ICD-10-CM | POA: Diagnosis present

## 2017-10-23 DIAGNOSIS — E785 Hyperlipidemia, unspecified: Secondary | ICD-10-CM | POA: Diagnosis present

## 2017-10-23 DIAGNOSIS — E440 Moderate protein-calorie malnutrition: Secondary | ICD-10-CM | POA: Diagnosis not present

## 2017-10-23 DIAGNOSIS — R7401 Elevation of levels of liver transaminase levels: Secondary | ICD-10-CM | POA: Diagnosis present

## 2017-10-23 DIAGNOSIS — H547 Unspecified visual loss: Secondary | ICD-10-CM | POA: Diagnosis present

## 2017-10-23 DIAGNOSIS — F0391 Unspecified dementia with behavioral disturbance: Secondary | ICD-10-CM

## 2017-10-23 DIAGNOSIS — I714 Abdominal aortic aneurysm, without rupture: Secondary | ICD-10-CM | POA: Diagnosis not present

## 2017-10-23 DIAGNOSIS — M1990 Unspecified osteoarthritis, unspecified site: Secondary | ICD-10-CM | POA: Diagnosis not present

## 2017-10-23 DIAGNOSIS — K7200 Acute and subacute hepatic failure without coma: Secondary | ICD-10-CM | POA: Diagnosis not present

## 2017-10-23 DIAGNOSIS — E872 Acidosis: Secondary | ICD-10-CM | POA: Diagnosis not present

## 2017-10-23 DIAGNOSIS — N179 Acute kidney failure, unspecified: Secondary | ICD-10-CM | POA: Diagnosis present

## 2017-10-23 DIAGNOSIS — G9341 Metabolic encephalopathy: Secondary | ICD-10-CM | POA: Diagnosis not present

## 2017-10-23 DIAGNOSIS — N1330 Unspecified hydronephrosis: Secondary | ICD-10-CM | POA: Diagnosis present

## 2017-10-23 DIAGNOSIS — G92 Toxic encephalopathy: Secondary | ICD-10-CM | POA: Diagnosis not present

## 2017-10-23 DIAGNOSIS — A4159 Other Gram-negative sepsis: Secondary | ICD-10-CM | POA: Diagnosis not present

## 2017-10-23 DIAGNOSIS — R6520 Severe sepsis without septic shock: Secondary | ICD-10-CM | POA: Diagnosis present

## 2017-10-23 DIAGNOSIS — R748 Abnormal levels of other serum enzymes: Secondary | ICD-10-CM

## 2017-10-23 DIAGNOSIS — J181 Lobar pneumonia, unspecified organism: Secondary | ICD-10-CM | POA: Diagnosis not present

## 2017-10-23 DIAGNOSIS — Z7982 Long term (current) use of aspirin: Secondary | ICD-10-CM

## 2017-10-23 DIAGNOSIS — Z8249 Family history of ischemic heart disease and other diseases of the circulatory system: Secondary | ICD-10-CM

## 2017-10-23 DIAGNOSIS — N390 Urinary tract infection, site not specified: Secondary | ICD-10-CM | POA: Diagnosis not present

## 2017-10-23 DIAGNOSIS — E876 Hypokalemia: Secondary | ICD-10-CM | POA: Diagnosis not present

## 2017-10-23 DIAGNOSIS — Z66 Do not resuscitate: Secondary | ICD-10-CM | POA: Diagnosis present

## 2017-10-23 DIAGNOSIS — Y95 Nosocomial condition: Secondary | ICD-10-CM | POA: Diagnosis present

## 2017-10-23 DIAGNOSIS — Z7401 Bed confinement status: Secondary | ICD-10-CM | POA: Diagnosis not present

## 2017-10-23 DIAGNOSIS — Z79899 Other long term (current) drug therapy: Secondary | ICD-10-CM

## 2017-10-23 DIAGNOSIS — D649 Anemia, unspecified: Secondary | ICD-10-CM | POA: Diagnosis present

## 2017-10-23 DIAGNOSIS — Z8781 Personal history of (healed) traumatic fracture: Secondary | ICD-10-CM | POA: Diagnosis not present

## 2017-10-23 DIAGNOSIS — R000 Tachycardia, unspecified: Secondary | ICD-10-CM | POA: Diagnosis not present

## 2017-10-23 DIAGNOSIS — R918 Other nonspecific abnormal finding of lung field: Secondary | ICD-10-CM | POA: Diagnosis not present

## 2017-10-23 DIAGNOSIS — R0689 Other abnormalities of breathing: Secondary | ICD-10-CM | POA: Diagnosis not present

## 2017-10-23 DIAGNOSIS — N289 Disorder of kidney and ureter, unspecified: Secondary | ICD-10-CM | POA: Diagnosis not present

## 2017-10-23 DIAGNOSIS — J189 Pneumonia, unspecified organism: Secondary | ICD-10-CM

## 2017-10-23 DIAGNOSIS — R0902 Hypoxemia: Secondary | ICD-10-CM | POA: Diagnosis not present

## 2017-10-23 DIAGNOSIS — G35 Multiple sclerosis: Secondary | ICD-10-CM | POA: Diagnosis present

## 2017-10-23 DIAGNOSIS — R0602 Shortness of breath: Secondary | ICD-10-CM

## 2017-10-23 DIAGNOSIS — I10 Essential (primary) hypertension: Secondary | ICD-10-CM | POA: Diagnosis not present

## 2017-10-23 DIAGNOSIS — F03918 Unspecified dementia, unspecified severity, with other behavioral disturbance: Secondary | ICD-10-CM | POA: Diagnosis present

## 2017-10-23 DIAGNOSIS — F17210 Nicotine dependence, cigarettes, uncomplicated: Secondary | ICD-10-CM | POA: Diagnosis present

## 2017-10-23 DIAGNOSIS — R402441 Other coma, without documented Glasgow coma scale score, or with partial score reported, in the field [EMT or ambulance]: Secondary | ICD-10-CM | POA: Diagnosis not present

## 2017-10-23 DIAGNOSIS — E871 Hypo-osmolality and hyponatremia: Secondary | ICD-10-CM | POA: Diagnosis present

## 2017-10-23 DIAGNOSIS — M2550 Pain in unspecified joint: Secondary | ICD-10-CM | POA: Diagnosis not present

## 2017-10-23 DIAGNOSIS — Z87891 Personal history of nicotine dependence: Secondary | ICD-10-CM

## 2017-10-23 LAB — COMPREHENSIVE METABOLIC PANEL
ALBUMIN: 1.9 g/dL — AB (ref 3.5–5.0)
ALK PHOS: 285 U/L — AB (ref 38–126)
ALT: 57 U/L (ref 17–63)
ALT: 68 U/L — ABNORMAL HIGH (ref 17–63)
ANION GAP: 17 — AB (ref 5–15)
AST: 97 U/L — ABNORMAL HIGH (ref 15–41)
AST: 107 U/L — AB (ref 15–41)
Albumin: 2.5 g/dL — ABNORMAL LOW (ref 3.5–5.0)
Alkaline Phosphatase: 216 U/L — ABNORMAL HIGH (ref 38–126)
Anion gap: 11 (ref 5–15)
BILIRUBIN TOTAL: 0.9 mg/dL (ref 0.3–1.2)
BUN: 37 mg/dL — ABNORMAL HIGH (ref 6–20)
BUN: 36 mg/dL — AB (ref 6–20)
CALCIUM: 8.5 mg/dL — AB (ref 8.9–10.3)
CHLORIDE: 97 mmol/L — AB (ref 101–111)
CO2: 17 mmol/L — ABNORMAL LOW (ref 22–32)
CO2: 17 mmol/L — AB (ref 22–32)
Calcium: 7.6 mg/dL — ABNORMAL LOW (ref 8.9–10.3)
Chloride: 86 mmol/L — ABNORMAL LOW (ref 101–111)
Creatinine, Ser: 1.79 mg/dL — ABNORMAL HIGH (ref 0.61–1.24)
Creatinine, Ser: 1.61 mg/dL — ABNORMAL HIGH (ref 0.61–1.24)
GFR calc Af Amer: 49 mL/min — ABNORMAL LOW (ref >60)
GFR, EST AFRICAN AMERICAN: 43 mL/min — AB (ref >60)
GFR, EST NON AFRICAN AMERICAN: 37 mL/min — AB (ref >60)
GFR, EST NON AFRICAN AMERICAN: 42 mL/min — AB (ref >60)
GLUCOSE: 119 mg/dL — AB (ref 65–99)
Glucose, Bld: 124 mg/dL — ABNORMAL HIGH (ref 65–99)
POTASSIUM: 4.4 mmol/L (ref 3.5–5.1)
POTASSIUM: 3.9 mmol/L (ref 3.5–5.1)
SODIUM: 125 mmol/L — AB (ref 135–145)
Sodium: 120 mmol/L — ABNORMAL LOW (ref 135–145)
TOTAL PROTEIN: 7.4 g/dL (ref 6.5–8.1)
Total Bilirubin: 1 mg/dL (ref 0.3–1.2)
Total Protein: 5.6 g/dL — ABNORMAL LOW (ref 6.5–8.1)

## 2017-10-23 LAB — URINALYSIS, ROUTINE W REFLEX MICROSCOPIC
BILIRUBIN URINE: NEGATIVE
Glucose, UA: NEGATIVE mg/dL
Ketones, ur: NEGATIVE mg/dL
NITRITE: NEGATIVE
PH: 7 (ref 5.0–8.0)
Protein, ur: 100 mg/dL — AB
SPECIFIC GRAVITY, URINE: 1.009 (ref 1.005–1.030)

## 2017-10-23 LAB — BLOOD CULTURE ID PANEL (REFLEXED)
Acinetobacter baumannii: NOT DETECTED
CANDIDA GLABRATA: NOT DETECTED
CANDIDA KRUSEI: NOT DETECTED
Candida albicans: NOT DETECTED
Candida parapsilosis: NOT DETECTED
Candida tropicalis: NOT DETECTED
Carbapenem resistance: NOT DETECTED
ENTEROCOCCUS SPECIES: NOT DETECTED
ESCHERICHIA COLI: NOT DETECTED
Enterobacter cloacae complex: NOT DETECTED
Enterobacteriaceae species: DETECTED — AB
Haemophilus influenzae: NOT DETECTED
KLEBSIELLA OXYTOCA: NOT DETECTED
Klebsiella pneumoniae: DETECTED — AB
LISTERIA MONOCYTOGENES: NOT DETECTED
Neisseria meningitidis: NOT DETECTED
PSEUDOMONAS AERUGINOSA: NOT DETECTED
Proteus species: NOT DETECTED
SERRATIA MARCESCENS: NOT DETECTED
STAPHYLOCOCCUS SPECIES: NOT DETECTED
STREPTOCOCCUS PNEUMONIAE: NOT DETECTED
STREPTOCOCCUS PYOGENES: NOT DETECTED
Staphylococcus aureus (BCID): NOT DETECTED
Streptococcus agalactiae: NOT DETECTED
Streptococcus species: NOT DETECTED

## 2017-10-23 LAB — CBC WITH DIFFERENTIAL/PLATELET
BASOS ABS: 0 10*3/uL (ref 0.0–0.1)
Basophils Relative: 0 %
EOS ABS: 0.1 10*3/uL (ref 0.0–0.7)
Eosinophils Relative: 1 %
HCT: 33.3 % — ABNORMAL LOW (ref 39.0–52.0)
HEMOGLOBIN: 11.3 g/dL — AB (ref 13.0–17.0)
LYMPHS PCT: 1 %
Lymphs Abs: 0.1 10*3/uL — ABNORMAL LOW (ref 0.7–4.0)
MCH: 31.1 pg (ref 26.0–34.0)
MCHC: 33.9 g/dL (ref 30.0–36.0)
MCV: 91.7 fL (ref 78.0–100.0)
MONOS PCT: 0 %
Monocytes Absolute: 0 10*3/uL — ABNORMAL LOW (ref 0.1–1.0)
NEUTROS ABS: 12.9 10*3/uL — AB (ref 1.7–7.7)
NEUTROS PCT: 98 %
Platelets: 415 10*3/uL — ABNORMAL HIGH (ref 150–400)
RBC: 3.63 MIL/uL — AB (ref 4.22–5.81)
RDW: 15.1 % (ref 11.5–15.5)
WBC: 13.1 10*3/uL — AB (ref 4.0–10.5)

## 2017-10-23 LAB — PROTIME-INR
INR: 1.39
Prothrombin Time: 16.9 seconds — ABNORMAL HIGH (ref 11.4–15.2)

## 2017-10-23 LAB — I-STAT CG4 LACTIC ACID, ED
Lactic Acid, Venous: 4.99 mmol/L (ref 0.5–1.9)
Lactic Acid, Venous: 1.9 mmol/L (ref 0.5–1.9)

## 2017-10-23 LAB — LACTIC ACID, PLASMA
LACTIC ACID, VENOUS: 1 mmol/L (ref 0.5–1.9)
LACTIC ACID, VENOUS: 1.2 mmol/L (ref 0.5–1.9)

## 2017-10-23 LAB — HIV ANTIBODY (ROUTINE TESTING W REFLEX): HIV Screen 4th Generation wRfx: NONREACTIVE

## 2017-10-23 LAB — MRSA PCR SCREENING: MRSA BY PCR: NEGATIVE

## 2017-10-23 MED ORDER — ONDANSETRON HCL 4 MG/2ML IJ SOLN: 4.0000 mg | Freq: Four times a day (QID) | INTRAMUSCULAR | Status: DC | PRN

## 2017-10-23 MED ORDER — SODIUM CHLORIDE 0.9 % IV BOLUS (SEPSIS)
500.0000 mL | Freq: Once | INTRAVENOUS | Status: AC
  Administered 2017-10-23: 500 mL via INTRAVENOUS

## 2017-10-23 MED ORDER — ACETAMINOPHEN 325 MG PO TABS
650.0000 mg | ORAL_TABLET | Freq: Two times a day (BID) | ORAL | Status: DC
  Administered 2017-10-23: 650 mg via ORAL
  Filled 2017-10-23: qty 2

## 2017-10-23 MED ORDER — SODIUM CHLORIDE 0.9 % IV SOLN
2.0000 g | Freq: Two times a day (BID) | INTRAVENOUS | Status: DC
  Administered 2017-10-23 – 2017-10-24 (×2): 2 g via INTRAVENOUS
  Filled 2017-10-23 (×2): qty 2

## 2017-10-23 MED ORDER — VANCOMYCIN HCL IN DEXTROSE 1-5 GM/200ML-% IV SOLN
1000.0000 mg | Freq: Once | INTRAVENOUS | Status: AC
  Administered 2017-10-23: 1000 mg via INTRAVENOUS
  Filled 2017-10-23: qty 200

## 2017-10-23 MED ORDER — IBUPROFEN 400 MG PO TABS
400.0000 mg | ORAL_TABLET | Freq: Once | ORAL | Status: AC
  Administered 2017-10-23: 400 mg via ORAL

## 2017-10-23 MED ORDER — ENOXAPARIN SODIUM 40 MG/0.4ML ~~LOC~~ SOLN
40.0000 mg | SUBCUTANEOUS | Status: DC
  Administered 2017-10-23 – 2017-10-25 (×3): 40 mg via SUBCUTANEOUS
  Filled 2017-10-23 (×3): qty 0.4

## 2017-10-23 MED ORDER — PIPERACILLIN-TAZOBACTAM 3.375 G IVPB 30 MIN
3.3750 g | Freq: Once | INTRAVENOUS | Status: AC
  Administered 2017-10-23: 3.375 g via INTRAVENOUS
  Filled 2017-10-23: qty 50

## 2017-10-23 MED ORDER — ONDANSETRON HCL 4 MG PO TABS: 4.0000 mg | ORAL_TABLET | Freq: Four times a day (QID) | ORAL | Status: DC | PRN

## 2017-10-23 MED ORDER — ACETAMINOPHEN 650 MG RE SUPP: 650.0000 mg | Freq: Four times a day (QID) | RECTAL | Status: DC | PRN

## 2017-10-23 MED ORDER — SODIUM CHLORIDE 0.9 % IV SOLN
1.0000 g | INTRAVENOUS | Status: DC
  Filled 2017-10-23: qty 1

## 2017-10-23 MED ORDER — DIVALPROEX SODIUM 125 MG PO CSDR
500.0000 mg | DELAYED_RELEASE_CAPSULE | Freq: Every day | ORAL | Status: DC
  Administered 2017-10-23 – 2017-10-25 (×3): 500 mg via ORAL
  Filled 2017-10-23 (×5): qty 4

## 2017-10-23 MED ORDER — SODIUM CHLORIDE 0.9 % IV SOLN
1.0000 g | Freq: Once | INTRAVENOUS | Status: AC
  Administered 2017-10-23: 1 g via INTRAVENOUS
  Filled 2017-10-23: qty 1

## 2017-10-23 MED ORDER — ASPIRIN 81 MG PO CHEW
81.0000 mg | CHEWABLE_TABLET | Freq: Every day | ORAL | Status: DC
  Administered 2017-10-23 – 2017-10-26 (×4): 81 mg via ORAL
  Filled 2017-10-23 (×4): qty 1

## 2017-10-23 MED ORDER — SODIUM CHLORIDE 0.9 % IV SOLN
2.0000 g | Freq: Two times a day (BID) | INTRAVENOUS | Status: DC
  Filled 2017-10-23 (×2): qty 2

## 2017-10-23 MED ORDER — VANCOMYCIN HCL 500 MG IV SOLR
500.0000 mg | INTRAVENOUS | Status: DC
  Administered 2017-10-23: 500 mg via INTRAVENOUS
  Filled 2017-10-23 (×3): qty 500

## 2017-10-23 MED ORDER — ACETAMINOPHEN 325 MG PO TABS
650.0000 mg | ORAL_TABLET | Freq: Once | ORAL | Status: AC
  Administered 2017-10-23: 650 mg via ORAL
  Filled 2017-10-23: qty 2

## 2017-10-23 MED ORDER — FINASTERIDE 5 MG PO TABS
5.0000 mg | ORAL_TABLET | Freq: Every day | ORAL | Status: DC
  Administered 2017-10-23 – 2017-10-26 (×4): 5 mg via ORAL
  Filled 2017-10-23 (×5): qty 1

## 2017-10-23 MED ORDER — CITALOPRAM HYDROBROMIDE 20 MG PO TABS
10.0000 mg | ORAL_TABLET | Freq: Every day | ORAL | Status: DC
  Administered 2017-10-23 – 2017-10-26 (×4): 10 mg via ORAL
  Filled 2017-10-23 (×4): qty 1

## 2017-10-23 MED ORDER — LORAZEPAM 0.5 MG PO TABS: 0.5000 mg | ORAL_TABLET | Freq: Every day | ORAL | Status: DC | PRN

## 2017-10-23 MED ORDER — DIVALPROEX SODIUM 125 MG PO CSDR
250.0000 mg | DELAYED_RELEASE_CAPSULE | Freq: Every day | ORAL | Status: DC
  Administered 2017-10-23 – 2017-10-26 (×4): 250 mg via ORAL
  Filled 2017-10-23 (×4): qty 2

## 2017-10-23 MED ORDER — SODIUM CHLORIDE 0.9 % IV BOLUS
1000.0000 mL | Freq: Once | INTRAVENOUS | Status: AC
  Administered 2017-10-23: 1000 mL via INTRAVENOUS

## 2017-10-23 MED ORDER — TRAZODONE HCL 50 MG PO TABS
50.0000 mg | ORAL_TABLET | Freq: Every evening | ORAL | Status: DC | PRN
  Administered 2017-10-24: 50 mg via ORAL
  Filled 2017-10-23: qty 1

## 2017-10-23 MED ORDER — ACETAMINOPHEN 325 MG PO TABS
650.0000 mg | ORAL_TABLET | Freq: Four times a day (QID) | ORAL | Status: DC | PRN
  Administered 2017-10-26: 650 mg via ORAL
  Filled 2017-10-23: qty 2

## 2017-10-23 MED ORDER — SODIUM CHLORIDE 0.9 % IV BOLUS (SEPSIS)
1000.0000 mL | Freq: Once | INTRAVENOUS | Status: AC
  Administered 2017-10-23: 1000 mL via INTRAVENOUS

## 2017-10-23 MED ORDER — SODIUM CHLORIDE 0.9 % IV BOLUS (SEPSIS)
250.0000 mL | Freq: Once | INTRAVENOUS | Status: AC
  Administered 2017-10-23: 250 mL via INTRAVENOUS

## 2017-10-23 NOTE — Progress Notes (Addendum)
[place] TEAM 1 - Stepdown/ICU TEAM  Joe Dawson  NTI:[id number] DOB: May 04, 1951 DOA: 10/23/2017 PCP: Gayland Curry, DO    Brief Narrative:  67yo M w/ a hx of Advanced dementia, MS, and HTN who was brought to the ED for fever and chills.    In the ED he was found to have a temp of 104.3, WBC 13, and Lactate 4.99.  CXR suggested a RLL PNA v/s atx.  UA suggested a UTI.  Significant Events: 6/3 admit   Subjective: Pt is sedate.  I spoke w/ his wife at bedside at length.    I find no clinical evidence of PNA, and feel his sx are all due to Pyelo w/ sepsis.  I have changed his abx accordingly.    Assessment & Plan:  Sepsis due to gram neg rod Bacteremia and Pyelonephritis   Hyponatremia  Transamitinitis   Severe dementia   DVT prophylaxis: lovenox  Code Status: DNR - NO CODE Family Communication:  Disposition Plan:   Consultants:  none  Antimicrobials:  Cefepime 6/2 Zosyn 6/2  Vanc 6/3 >  Objective: Blood pressure 108/73, pulse 67, temperature (S) 100 F (37.8 C), temperature source (S) Rectal, resp. rate 16, height 5\' 6"  (1.676 m), weight 57.6 kg (127 lb), SpO2 97 %.  Intake/Output Summary (Last 24 hours) at 10/23/2017 1602 Last data filed at 10/23/2017 0302 Gross per 24 hour  Intake 3250 ml  Output 1400 ml  Net 1850 ml   Filed Weights   10/23/17 0128 10/23/17 0135  Weight: 57.6 kg (127 lb) 57.6 kg (127 lb)    Examination: Pt seen for a f/u exam.    CBC: Recent Labs  Lab 10/23/17 0113  WBC 13.1*  NEUTROABS 12.9*  HGB 11.3*  HCT 33.3*  MCV 91.7  PLT 867*   Basic Metabolic Panel: Recent Labs  Lab 10/23/17 0113 10/23/17 0502  NA 120* 125*  K 4.4 3.9  CL 86* 97*  CO2 17* 17*  GLUCOSE 119* 124*  BUN 37* 36*  CREATININE 1.79* 1.61*  CALCIUM 8.5* 7.6*   GFR: Estimated Creatinine Clearance: 35.8 mL/min (A) (by C-G formula based on SCr of 1.61 mg/dL (H)).  Liver Function Tests: Recent Labs  Lab 10/23/17 0113 10/23/17 0502  AST 97* 107*    ALT 57 68*  ALKPHOS 285* 216*  BILITOT 0.9 1.0  PROT 7.4 5.6*  ALBUMIN 2.5* 1.9*    Coagulation Profile: Recent Labs  Lab 10/23/17 0113  INR 1.39    Recent Results (from the past 240 hour(s))  Culture, blood (Routine x 2)     Status: None (Preliminary result)   Collection Time: 10/23/17  1:20 AM  Result Value Ref Range Status   Specimen Description BLOOD LEFT WRIST  Final   Special Requests   Final    BOTTLES DRAWN AEROBIC AND ANAEROBIC Blood Culture results may not be optimal due to an inadequate volume of blood received in culture bottles   Culture  Setup Time   Final    GRAM NEGATIVE RODS ANAEROBIC BOTTLE ONLY Organism ID to follow Performed at [facility], [place] [address], [place], [place] [address]    Culture GRAM NEGATIVE RODS  Final   Report Status PENDING  Incomplete     Scheduled Meds: . acetaminophen  650 mg Oral BID  . aspirin  81 mg Oral Daily  . citalopram  10 mg Oral Daily  . divalproex  250 mg Oral Daily  . divalproex  500  mg Oral QHS  . enoxaparin (LOVENOX) injection  40 mg Subcutaneous Q24H  . finasteride  5 mg Oral Daily     LOS: 0 days   Cherene Altes, MD [facility] Office  [phone] Pager - Text Page per Amion as per below:  On-Call/Text Page:      [email or web address]      password [email or web address]  If 7PM-7AM, please contact night-coverage [email or web address] Password [email or web address] 10/23/2017, 4:02 PM

## 2017-10-23 NOTE — Progress Notes (Addendum)
Pharmacy Antibiotic Note  Goldman B Klingbeil is a 67 y.o. male admitted on 10/23/2017 with Klebsiella bacteremia.  Pharmacy has been consulted for Merrem dosing. Spoke with Dr. Thereasa Solo as lab did not detect resistance, but he would like to continue with ESBL coverage for now.   Plan: D/C Cefepime and Vancomycin per Dr. Thereasa Solo.  Change to Merrem 2g IV every 12 hours based on current renal function.  Monitor for ability to narrow quickly.   Height: 5\' 6"  (167.6 cm) Weight: 127 lb (57.6 kg) IBW/kg (Calculated) : 63.8  Temp (24hrs), Avg:101.1 F (38.4 C), Min:98.7 F (37.1 C), Max:104.3 F (40.2 C)  Recent Labs  Lab 10/23/17 0113 10/23/17 0130 10/23/17 0343 10/23/17 0502 10/23/17 0648 10/23/17 0910  WBC 13.1*  --   --   --   --   --   CREATININE 1.79*  --   --  1.61*  --   --   LATICACIDVEN  --  4.99* 1.90  --  1.0 1.2    Estimated Creatinine Clearance: 35.8 mL/min (A) (by C-G formula based on SCr of 1.61 mg/dL (H)).    Allergies  Allergen Reactions  . Epinephrine     Legs shake    Antimicrobials this admission: Vancomycin 6/3 >> 6/3 Cefepime 6/3 >> 6/3 Merrem 6/3 >>  Dose adjustments this admission:   Microbiology results: 6/3 BCx: sent 6/3 UCx: sent  Thank you for allowing pharmacy to be a part of this patient's care.  Sloan Leiter, PharmD, BCPS, BCCCP Clinical Pharmacist Clinical phone 10/23/2017 until 3:30PM [phone] After hours, please call [phone] 10/23/2017 6:11 PM

## 2017-10-23 NOTE — H&P (Addendum)
History and Physical    Joe Dawson XIP:[id number] DOB: 1951/03/05 DOA: 10/23/2017  PCP: Gayland Curry, DO  Patient coming from: Home  I have personally briefly reviewed patient's old medical records in [place]  Chief Complaint: Fever  HPI: Joe Dawson is a 67 y.o. male with medical history significant of Advanced dementia, MS, HTN.  Patient brought in to ED for fever and chills.  Patient has been living at home with wife despite advanced dementia following a hospital stay in March of this year.  Wife pulled him out of SNF due to him loosing weight in SNF apparently.  Patient has had 3 hospital stays over last 6 months for sepsis due to UTIs or PNAs (today will be his 4th).  Patient apparently running low grade fever last night up to 100.  Treated with tylenol.  During the day today he was his normal self and at baseline.  This evening awoke with fever and chills.  Given tylenol again by wife and transported to ED.   ED Course: Tm 104.x improved to 101.x after tylenol and Ibuprofen.  WBC 13.1.  Lactate initially 4.99 but clears to 1.9 after IVF bolus.  Slight LFT elevation with AST 97, ALK 285.  Sodium 120, bicarb 17.  CXR suggests RLL PNA.  UA suggests UTI.  No respiratory distress, cough, nor O2 requirement.   Review of Systems: Unable to perform due to dementia.  Past Medical History:  Diagnosis Date  . AAA (abdominal aortic aneurysm) (HCC)    3 cm infrarenal partically visualized on CT hip 06/02/17  . Arthritis   . Atherosclerosis    mild- noted on CTabdomen/pelvis 06/18/17  . Blindness - both eyes   . BPH (benign prostatic hyperplasia)   . Dementia with behavioral disturbance   . History of vertebral fracture    moderate to severe superior endplate compression fracture of L4 vertebralbody-CT hip 06/02/17  . Hyperlipidemia   . Hypertension   . MS (multiple sclerosis) ([place])   . Osteoarthritis    moderate to severe right hip-noted on CT hip 06/02/17  . Tobacco  abuse   . Urogenital candidiasis     Past Surgical History:  Procedure Laterality Date  . bilateral inguinal hernia  1974   Dr. Viona Gilmore  . broken foot bone    . broken hand  1998     reports that he has been smoking cigarettes.  He has been smoking about 0.50 packs per day. He has never used smokeless tobacco. He reports that he has current or past drug history. Drug: Marijuana. He reports that he does not drink alcohol.  Allergies  Allergen Reactions  . Epinephrine     Legs shake    Family History  Problem Relation Age of Onset  . Cancer Mother        Breast  . Heart disease Father 52  . Colon cancer Neg Hx   . Stomach cancer Neg Hx      Prior to Admission medications   Medication Sig Start Date End Date Taking? Authorizing Provider  acetaminophen (TYLENOL) 325 MG tablet Take 650 mg by mouth 2 (two) times daily.   Yes [provider]  aspirin 81 MG tablet Take 81 mg by mouth daily.    Yes [provider]  citalopram (CELEXA) 10 MG tablet TAKE 1 TABLET BY MOUTH EVERY DAY 10/09/17  Yes Reed, Tiffany L, DO  divalproex (DEPAKOTE SPRINKLE) 125 MG capsule TAKE 2 CAPSULES BY MOUTH EVERY  DAY AND 4 CAPSULES EVERY EVENING AT BEDTIME 10/09/17  Yes Reed, Tiffany L, DO  finasteride (PROSCAR) 5 MG tablet TAKE 1 TABLET BY MOUTH EVERY DAY 10/09/17  Yes Reed, Tiffany L, DO  lisinopril (PRINIVIL,ZESTRIL) 20 MG tablet Take 20 mg by mouth daily.   Yes [provider]  LORazepam (ATIVAN) 0.5 MG tablet Take 1 tablet (0.5 mg total) by mouth daily as needed for anxiety. 07/09/17  Yes Theodis Blaze, MD  tamsulosin (FLOMAX) 0.4 MG CAPS capsule TAKE ONE CAPSULE BY MOUTH EVERY EVENING AT BEDTIME 10/09/17  Yes Reed, Tiffany L, DO  traZODone (DESYREL) 50 MG tablet Take 50 mg by mouth at bedtime as needed for sleep.    Yes [provider]    Physical Exam: Vitals:   10/23/17 0418 10/23/17 0421 10/23/17 0422 10/23/17 0430  BP: 91/68  (!) 86/64 (!) 79/58  Pulse: (!) 101   99 96  Resp: (!) 22  20 15   Temp:  (!) 101.1 F (38.4 C)    TempSrc:  Rectal    SpO2: 96%  97% 95%  Weight:      Height:        Constitutional: NAD, calm, comfortable Eyes: PERRL, lids and conjunctivae normal ENMT: Mucous membranes are moist. Posterior pharynx clear of any exudate or lesions.Normal dentition.  Neck: normal, supple, no masses, no thyromegaly Respiratory: Maybe few rales on the R side but good air movement, O2 sat, and mostly clear, clear on left Cardiovascular: Regular rate and rhythm, no murmurs / rubs / gallops. No extremity edema. 2+ pedal pulses. No carotid bruits.  Abdomen: no tenderness, no masses palpated. No hepatosplenomegaly. Bowel sounds positive.  Musculoskeletal: no clubbing / cyanosis. No joint deformity upper and lower extremities. Good ROM, no contractures. Normal muscle tone.  Skin: no rashes, lesions, ulcers. No induration Neurologic: Oriented to person, but not place nor time (apparently baseline per wife).   Labs on Admission: I have personally reviewed following labs and imaging studies  CBC: Recent Labs  Lab 10/23/17 0113  WBC 13.1*  NEUTROABS 12.9*  HGB 11.3*  HCT 33.3*  MCV 91.7  PLT 160*   Basic Metabolic Panel: Recent Labs  Lab 10/23/17 0113  NA 120*  K 4.4  CL 86*  CO2 17*  GLUCOSE 119*  BUN 37*  CREATININE 1.79*  CALCIUM 8.5*   GFR: Estimated Creatinine Clearance: 32.2 mL/min (A) (by C-G formula based on SCr of 1.79 mg/dL (H)). Liver Function Tests: Recent Labs  Lab 10/23/17 0113  AST 97*  ALT 57  ALKPHOS 285*  BILITOT 0.9  PROT 7.4  ALBUMIN 2.5*   No results for input(s): LIPASE, AMYLASE in the last 168 hours. No results for input(s): AMMONIA in the last 168 hours. Coagulation Profile: Recent Labs  Lab 10/23/17 0113  INR 1.39   Cardiac Enzymes: No results for input(s): CKTOTAL, CKMB, CKMBINDEX, TROPONINI in the last 168 hours. BNP (last 3 results) No results for input(s): PROBNP in the last 8760  hours. HbA1C: No results for input(s): HGBA1C in the last 72 hours. CBG: No results for input(s): GLUCAP in the last 168 hours. Lipid Profile: No results for input(s): CHOL, HDL, LDLCALC, TRIG, CHOLHDL, LDLDIRECT in the last 72 hours. Thyroid Function Tests: No results for input(s): TSH, T4TOTAL, FREET4, T3FREE, THYROIDAB in the last 72 hours. Anemia Panel: No results for input(s): VITAMINB12, FOLATE, FERRITIN, TIBC, IRON, RETICCTPCT in the last 72 hours. Urine analysis:    Component Value Date/Time   COLORURINE AMBER (A)  10/23/2017 0301   APPEARANCEUR CLOUDY (A) 10/23/2017 0301   LABSPEC 1.009 10/23/2017 0301   PHURINE 7.0 10/23/2017 0301   GLUCOSEU NEGATIVE 10/23/2017 0301   HGBUR MODERATE (A) 10/23/2017 0301   BILIRUBINUR NEGATIVE 10/23/2017 0301   KETONESUR NEGATIVE 10/23/2017 0301   PROTEINUR 100 (A) 10/23/2017 0301   NITRITE NEGATIVE 10/23/2017 0301   LEUKOCYTESUR LARGE (A) 10/23/2017 0301    Radiological Exams on Admission: Dg Chest Port 1 View  Result Date: 10/23/2017 CLINICAL DATA:  Acute onset of fever. Dark urine. EXAM: PORTABLE CHEST 1 VIEW COMPARISON:  Chest radiograph performed 08/16/2017 FINDINGS: The lungs are hypoexpanded. Mild right basilar opacity may reflect pneumonia. There is no evidence of pleural effusion or pneumothorax. The cardiomediastinal silhouette is borderline normal in size. No acute osseous abnormalities are seen. IMPRESSION: Lungs hypoexpanded. Mild right basilar airspace opacity may reflect pneumonia. This is in a similar location to the prior opacity. Followup PA and lateral chest X-ray is recommended in 3-4 weeks following trial of antibiotic therapy to ensure resolution and exclude underlying malignancy. Electronically Signed   By: Garald Balding M.D.   On: 10/23/2017 01:57    EKG: Independently reviewed.  Assessment/Plan Principal Problem:   Severe sepsis with acute organ dysfunction (HCC) Active Problems:   Blind   Dementia with  behavioral disturbance   HCAP (healthcare-associated pneumonia)   Acute metabolic encephalopathy   Sepsis secondary to UTI ([place])   Sepsis associated hypotension (HCC)   Hyponatremia   Transaminitis    1. Severe sepsis with AKI, hypotension - 1. UTI vs PNA as source. 2. Empiric cefepime and vanc 3. UCx, BCx, sputum Cx 4. IVF: 2750 L NS bolus thus far, giving another 500cc bolus now then 125 cc/hr 5. Hold all home BP meds 6. Tylenol PRN fever 7. Repeat CBC/BMP tomorrow AM 8. Repeat lactate Q3H 9. Strict intake and output 2. Hyponatremia - 1. Stat repeat CMP now 2. IVF: NS as above 3. Transaminitis - Mild 1. CT abd/pelvis w/o contrast ordered stat to r/o 1. Ascending cholangitis 2. Hydronephrosis / infected stone 4. Acute metabolic encephalopathy - secondary to #1 above 5. Chronic dementia - 1. Im not entirely clear if due to MS vs Alzheimer's, seems like Alzheimer's based on historical notes? 2. But regardless is very severe at baseline 3. Continue home meds  DVT prophylaxis: Lovenox Code Status: DNR - yellow form at bedside Family Communication: No family in room Disposition Plan: Home after admit Consults called: None Admission status: Admit to inpatient - IP status for treatment of severe sepsis with hypotension.   Etta Quill DO [facility] Pager [phone]  If 7AM-7PM, please contact day team taking care of patient [email or web address] Password [facility]  10/23/2017, 4:36 AM

## 2017-10-23 NOTE — ED Notes (Signed)
Admitting MD text paged.   Joe Dawson D34 has received 750Ml of fluids BP 78/50. Fluids are still running at this time.   Carmelina Paddock' RN

## 2017-10-23 NOTE — Patient Outreach (Signed)
[place]) Care Management [place] Telephone Outreach Care Coordination  10/23/2017  [name] [birth year] [id number]  [place] CM care coordination outreach re: Joe Dawson, 67 y/o male referred to [place] on 10/11/17 by insurance plan; patient has had multiple hospitalizations and ED visits.  Patient has history including, but not limited to, bilateral eye legal blindness; bilateral hearing loss; severe dementia with behavioral disturbance; BPH; HTN/ HLD.   Noted through review of EMR that patient had been admitted to hospital this afternoon after ED visit for fever of UKO, sepsis suspicious for UTI vs pneumonia.   Secure communication via EMR sent to [place] [facility] [place], and [place], notifying of patient's hospital admission  Plan:  [place] RN CM will follow patient's progress while hospitalized and collaborate with [facility] liaison's for discharge disposition.  Joe Rack, RN, BSN, [employer] [facility] Care Management  [phone]

## 2017-10-23 NOTE — ED Triage Notes (Signed)
Vancomycin due at 0141

## 2017-10-23 NOTE — ED Triage Notes (Signed)
Pt. Arrived via EMS with reports of fever for the past 4 days. Pt. Took 1000 mg of tylenol at 2300. Wife reports strong odor urine with dark color. Hx of dementia, COPD, and pneumonia.  CBG 153

## 2017-10-23 NOTE — ED Notes (Signed)
Admiting Ed at bedside aware of BP

## 2017-10-23 NOTE — Progress Notes (Signed)
ANTIBIOTIC CONSULT NOTE - INITIAL  Pharmacy Consult for vancomycin and cefepime Indication: pneumonia  Allergies  Allergen Reactions  . Epinephrine     Legs shake    Patient Measurements: Height: 5\' 6"  (167.6 cm) Weight: 127 lb (57.6 kg) IBW/kg (Calculated) : 63.8   Vital Signs: Temp: 101.1 F (38.4 C) (06/03 0421) Temp Source: Rectal (06/03 0421) BP: 91/71 (06/03 0445) Pulse Rate: 101 (06/03 0445) Intake/Output from previous day: 06/02 0701 - 06/03 0700 In: 3250 [IV Piggyback:3250] Out: 1400 [Urine:1400] Intake/Output from this shift: Total I/O In: 3250 [IV Piggyback:3250] Out: 1400 [Urine:1400]  Labs: Recent Labs    10/23/17 0113  WBC 13.1*  HGB 11.3*  PLT 415*  CREATININE 1.79*   Estimated Creatinine Clearance: 32.2 mL/min (A) (by C-G formula based on SCr of 1.79 mg/dL (H)). No results for input(s): VANCOTROUGH, VANCOPEAK, VANCORANDOM, GENTTROUGH, GENTPEAK, GENTRANDOM, TOBRATROUGH, TOBRAPEAK, TOBRARND, AMIKACINPEAK, AMIKACINTROU, AMIKACIN in the last 72 hours.   Microbiology: No results found for this or any previous visit (from the past 720 hour(s)).  Medical History: Past Medical History:  Diagnosis Date  . AAA (abdominal aortic aneurysm) (HCC)    3 cm infrarenal partically visualized on CT hip 06/02/17  . Arthritis   . Atherosclerosis    mild- noted on CTabdomen/pelvis 06/18/17  . Blindness - both eyes   . BPH (benign prostatic hyperplasia)   . Dementia with behavioral disturbance   . History of vertebral fracture    moderate to severe superior endplate compression fracture of L4 vertebralbody-CT hip 06/02/17  . Hyperlipidemia   . Hypertension   . MS (multiple sclerosis) ([place])   . Osteoarthritis    moderate to severe right hip-noted on CT hip 06/02/17  . Tobacco abuse   . Urogenital candidiasis     Medications:  See medication history Assessment: 67 yo man to start vancomycin and zosyn for PNA.  He received 1gm of vanc and 3.375 gm of cefepime  in the ED  Goal of Therapy:  Vancomycin trough level 15-20 mcg/ml  Plan:  Continue vancomycin 500 mg IV q24 hours Cefepime 1gm IV q24 hours F/u renal function, cultures and clinical course  Haddy Mullinax [place] 10/23/2017,5:04 AM

## 2017-10-23 NOTE — ED Provider Notes (Signed)
[facility] EMERGENCY DEPARTMENT Provider Note   CSN: [id number] Arrival date & time: 10/23/17  0109     History   Chief Complaint Chief Complaint  Patient presents with  . Fever    HPI Joe Dawson is a 67 y.o. male.  The history is provided by the spouse. The history is limited by the condition of the patient (Dementia).  He has history of dementia, hypertension, hyperlipidemia, multiple sclerosis, abdominal aneurysm and is brought in tonight because of fever and chills.  He ran a low-grade fever last night up to 100, treated with acetaminophen.  During the day today, he was his normal self.  This evening, he woke up with chills and fever.  His wife gave him acetaminophen, and he was transported to the ED.  He has a chronic cough which is not changed.  He has been admitted to the hospital 3 other times in the last 6 months with sepsis from either pneumonia or urinary tract infection.  Past Medical History:  Diagnosis Date  . AAA (abdominal aortic aneurysm) (HCC)    3 cm infrarenal partically visualized on CT hip 06/02/17  . Arthritis   . Atherosclerosis    mild- noted on CTabdomen/pelvis 06/18/17  . Blindness - both eyes   . BPH (benign prostatic hyperplasia)   . Dementia with behavioral disturbance   . History of vertebral fracture    moderate to severe superior endplate compression fracture of L4 vertebralbody-CT hip 06/02/17  . Hyperlipidemia   . Hypertension   . MS (multiple sclerosis) ([place])   . Osteoarthritis    moderate to severe right hip-noted on CT hip 06/02/17  . Tobacco abuse   . Urogenital candidiasis     Patient Active Problem List   Diagnosis Date Noted  . HCAP (healthcare-associated pneumonia) 08/16/2017  . Acute metabolic encephalopathy [id number]/6734  . Hypokalemia 08/16/2017  . UTI (urinary tract infection) 06/25/2017  . Dementia with behavioral disturbance 05/28/2017  . 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 Memory loss 11/21/2016  . Essential hypertension, benign 11/21/2014  . 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 Hyperlipidemia LDL goal <100 08/09/2013  . Blind 08/09/2013  . Vitamin D insufficiency 08/09/2013  . Benign prostatic hyperplasia 08/09/2013  . Hyperglycemia 08/09/2013  . Tobacco abuse 08/09/2013    Past Surgical History:  Procedure Laterality Date  . bilateral inguinal hernia  1974   Dr. Viona Gilmore  . broken foot bone    . broken hand  1998        Home Medications    Prior to Admission medications   Medication Sig Start Date End Date Taking? Authorizing Provider  acetaminophen (TYLENOL) 325 MG tablet Take 650 mg by mouth 2 (two) times daily.    [provider]  aspirin 81 MG tablet Take 81 mg by mouth daily.     [provider]  cholecalciferol (VITAMIN D) 1000 units tablet Take 1,000 Units by mouth daily.    [provider]  citalopram (CELEXA) 10 MG tablet TAKE 1 TABLET BY MOUTH EVERY DAY 10/09/17   Reed, Tiffany L, DO  divalproex (DEPAKOTE SPRINKLE) 125 MG capsule TAKE 2 CAPSULES BY MOUTH EVERY DAY AND 4 CAPSULES EVERY EVENING AT BEDTIME 10/09/17   Reed, Tiffany L, DO  finasteride (PROSCAR) 5 MG tablet TAKE 1 TABLET BY MOUTH EVERY DAY 10/09/17   Reed, Tiffany L, DO  haloperidol (HALDOL) 2 MG tablet Take 2 mg by mouth 3 (three) times  daily.    [provider]  haloperidol (HALDOL) 2 MG/ML solution Take 2 mg by mouth every 8 (eight) hours. 07/25/17   [provider]  lisinopril (PRINIVIL,ZESTRIL) 20 MG tablet Take 20 mg by mouth daily.    [provider]  LORazepam (ATIVAN) 0.5 MG tablet Take 1 tablet (0.5 mg total) by mouth daily as needed for anxiety. Patient taking differently: Take 0.5 mg by mouth every 8 (eight) hours as needed for anxiety.  07/09/17   Theodis Blaze, MD  saccharomyces boulardii (FLORASTOR) 250 MG capsule Take 250 mg by mouth daily.    [provider]  sertraline (ZOLOFT) 50 MG  tablet Take 50 mg by mouth daily. 07/25/17   [provider]  tamsulosin (FLOMAX) 0.4 MG CAPS capsule TAKE ONE CAPSULE BY MOUTH EVERY EVENING AT BEDTIME 10/09/17   Reed, Tiffany L, DO  traZODone (DESYREL) 50 MG tablet Take 50 mg by mouth at bedtime.    [provider]    Family History Family History  Problem Relation Age of Onset  . Cancer Mother        Breast  . Heart disease Father 42  . Colon cancer Neg Hx   . Stomach cancer Neg Hx     Social History Social History   Tobacco Use  . Smoking status: Current Every Day Smoker    Packs/day: 0.50    Types: Cigarettes  . Smokeless tobacco: Never Used  Substance Use Topics  . Alcohol use: No  . Drug use: Yes    Types: Marijuana    Comment: Quit back in College      Allergies   Epinephrine   Review of Systems Review of Systems  Unable to perform ROS: Dementia     Physical Exam Updated Vital Signs BP 138/88   Pulse (!) 146   Temp (!) 104.3 F (40.2 C) (Rectal)   Resp (!) 33   SpO2 96%   Physical Exam  Nursing note and vitals reviewed.  Cachectic 67 year old male, resting comfortably and in no acute distress. Vital signs are significant for fever, tachycardia, tachypnea. Oxygen saturation is 96%, which is normal. Head is normocephalic and atraumatic. PERRLA, EOMI. Oropharynx is clear. Neck is nontender and supple without adenopathy or JVD. Back is nontender and there is no CVA tenderness. Lungs are clear without rales, wheezes, or rhonchi. Chest is nontender. Heart has regular rate and rhythm without murmur. Abdomen is soft, flat, nontender without masses or hepatosplenomegaly and peristalsis is normoactive. Genitalia: Circumcised penis, testicles descended without masses. Extremities have no cyanosis or edema, full range of motion is present. Skin is warm and dry without rash. Neurologic: Awake and alert and oriented to person but not place or time (per his wife, this is his baseline), cranial  nerves are intact, there are no motor or sensory deficits.  ED Treatments / Results  Labs (all labs ordered are listed, but only abnormal results are displayed) Labs Reviewed  COMPREHENSIVE METABOLIC PANEL - Abnormal; Notable for the following components:      Result Value   Sodium 120 (*)    Chloride 86 (*)    CO2 17 (*)    Glucose, Bld 119 (*)    BUN 37 (*)    Creatinine, Ser 1.79 (*)    Calcium 8.5 (*)    Albumin 2.5 (*)    AST 97 (*)    Alkaline Phosphatase 285 (*)    GFR calc non Af Amer 37 (*)  GFR calc Af Amer 43 (*)    Anion gap 17 (*)    All other components within normal limits  CBC WITH DIFFERENTIAL/PLATELET - Abnormal; Notable for the following components:   WBC 13.1 (*)    RBC 3.63 (*)    Hemoglobin 11.3 (*)    HCT 33.3 (*)    Platelets 415 (*)    All other components within normal limits  PROTIME-INR - Abnormal; Notable for the following components:   Prothrombin Time 16.9 (*)    All other components within normal limits  I-STAT CG4 LACTIC ACID, ED - Abnormal; Notable for the following components:   Lactic Acid, Venous 4.99 (*)    All other components within normal limits  CULTURE, BLOOD (ROUTINE X 2)  CULTURE, BLOOD (ROUTINE X 2)  URINE CULTURE  URINALYSIS, ROUTINE W REFLEX MICROSCOPIC    EKG EKG Interpretation  Date/Time:  Monday October 23 2017 01:32:33 EDT Ventricular Rate:  110 PR Interval:    QRS Duration: 87 QT Interval:  314 QTC Calculation: 425 R Axis:   72 Text Interpretation:  Sinus tachycardia Probable left atrial enlargement Probable LVH with secondary repol abnrm When compared with ECG of 05/28/2017, HEART RATE has increased Confirmed by Delora Fuel ([id number]) on 10/23/2017 1:40:55 AM   Radiology Dg Chest Port 1 View  Result Date: 10/23/2017 CLINICAL DATA:  Acute onset of fever. Dark urine. EXAM: PORTABLE CHEST 1 VIEW COMPARISON:  Chest radiograph performed 08/16/2017 FINDINGS: The lungs are hypoexpanded. Mild right basilar opacity may  reflect pneumonia. There is no evidence of pleural effusion or pneumothorax. The cardiomediastinal silhouette is borderline normal in size. No acute osseous abnormalities are seen. IMPRESSION: Lungs hypoexpanded. Mild right basilar airspace opacity may reflect pneumonia. This is in a similar location to the prior opacity. Followup PA and lateral chest X-ray is recommended in 3-4 weeks following trial of antibiotic therapy to ensure resolution and exclude underlying malignancy. Electronically Signed   By: Garald Balding M.D.   On: 10/23/2017 01:57    Procedures Procedures  CRITICAL CARE Performed by: Delora Fuel Total critical care time: 50 minutes Critical care time was exclusive of separately billable procedures and treating other patients. Critical care was necessary to treat or prevent imminent or life-threatening deterioration. Critical care was time spent personally by me on the following activities: development of treatment plan with patient and/or surrogate as well as nursing, discussions with consultants, evaluation of patient's response to treatment, examination of patient, obtaining history from patient or surrogate, ordering and performing treatments and interventions, ordering and review of laboratory studies, ordering and review of radiographic studies, pulse oximetry and re-evaluation of patient's condition.  Medications Ordered in ED Medications  vancomycin (VANCOCIN) IVPB 1000 mg/200 mL premix (1,000 mg Intravenous New Bag/Given 10/23/17 0141)  ibuprofen (ADVIL,MOTRIN) tablet 400 mg (400 mg Oral Given 10/23/17 0127)  piperacillin-tazobactam (ZOSYN) IVPB 3.375 g (0 g Intravenous Stopped 10/23/17 0214)  sodium chloride 0.9 % bolus 1,000 mL (0 mLs Intravenous Stopped 10/23/17 0214)    And  sodium chloride 0.9 % bolus 500 mL (0 mLs Intravenous Stopped 10/23/17 0214)    And  sodium chloride 0.9 % bolus 250 mL (0 mLs Intravenous Stopped 10/23/17 0214)  acetaminophen (TYLENOL) tablet 650 mg (650  mg Oral Given 10/23/17 0238)     Initial Impression / Assessment and Plan / ED Course  I have reviewed the triage vital signs and the nursing notes.  Pertinent labs & imaging results that were available during my care of  the patient were reviewed by me and considered in my medical decision making (see chart for details).  Sepsis with likely source is pneumonia versus urinary tract infection.  He is not hypotensive, but he is tachycardic.  He is started on IV fluids and started on antibiotics for sepsis of undetermined origin.  Old records are reviewed confirming hospital admissions for UTI and pneumonia over the last 54months.  Sepsis work-up has been initiated including chest x-ray and urinalysis.  Initial lactic acid level has come back at 4.99, consistent with sepsis.  Chest x-ray has very faint density at the right base consistent with pneumonia.  Urinalysis is still pending.  Labs show severe hyponatremia with sodium 120, acute kidney injury with creatinine 1.79 compared with 0.5 on March 30.  Also, alkaline phosphatase and AST are elevated and they had been normal previously.  Mild anemia is present with hemoglobin slightly lower than value from March 30, but not significantly changed compared with a value from March 28.  At this point, I question whether the pneumonia is sufficient to account for his lactate and other findings.  However, he has been started on antibiotics which would be appropriate whether he has pneumonia or UTI or both.  Case is discussed with Dr. Alcario Drought of [facility], who agrees to admit the patient.  Final Clinical Impressions(s) / ED Diagnoses   Final diagnoses:  Sepsis due to undetermined organism [facility])  Community acquired pneumonia of right lower lobe of lung ([place])  Acute kidney injury (nontraumatic) ([place])  Hyponatremia  Elevated serum alkaline phosphatase level  Elevated AST (SGOT)  Normochromic normocytic anemia    ED Discharge Orders    None        Delora Fuel, MD 35/00/93 [phone]

## 2017-10-23 NOTE — ED Notes (Signed)
Admitting at bedside. Admitting aroused patient, patient BP increased to 95/66

## 2017-10-23 NOTE — ED Notes (Signed)
Admitting MD aware of patient BP

## 2017-10-23 NOTE — ED Triage Notes (Signed)
Wife in room at Pt bedside . Wife reported when Pt was at [facility] he had urinary retention.

## 2017-10-23 NOTE — ED Notes (Signed)
Patient sleeping at this time. Patient response to name.,

## 2017-10-23 NOTE — Progress Notes (Signed)
Patient arrived from [facility] to 4E room19 with urosepsis. Telemetry monitor applied and CCMD notified. Patient oriented to unit and room to include call light and phone. Will continue to monitor.

## 2017-10-23 NOTE — Progress Notes (Signed)
PHARMACY - PHYSICIAN COMMUNICATION CRITICAL VALUE ALERT - BLOOD CULTURE IDENTIFICATION (BCID)  Joe Dawson is an 67 y.o. male who presented to [facility] on 10/23/2017 with a chief complaint of fever.  Assessment: Patient has 2/2 blood cultures with GNR. BCID positive for Klebsiella -no resistance detected. Patient has had recent hospitalizations and is on broad spectrum antibiotics with Vancomycin and Cefepime at this time. Source suspected as pneumonia versus UTI. Sputum and Urine cultures are pending. CXR suggests RLL PNA.  Name of physician (or Provider) Contacted: Dr. Thereasa Solo  Current antibiotics: Vancomycin and Cefepime  Changes to prescribed antibiotics recommended:  Patient is on recommended antibiotics - No changes needed  Follow-up sputum culture to rule out if Vancomycin is needed - consider MRSA PCR to help.  Results for orders placed or performed during the hospital encounter of 10/23/17  Blood Culture ID Panel (Reflexed) (Collected: 10/23/2017  1:20 AM)  Result Value Ref Range   Enterococcus species NOT DETECTED NOT DETECTED   Listeria monocytogenes NOT DETECTED NOT DETECTED   Staphylococcus species NOT DETECTED NOT DETECTED   Staphylococcus aureus NOT DETECTED NOT DETECTED   Streptococcus species NOT DETECTED NOT DETECTED   Streptococcus agalactiae NOT DETECTED NOT DETECTED   Streptococcus pneumoniae NOT DETECTED NOT DETECTED   Streptococcus pyogenes NOT DETECTED NOT DETECTED   Acinetobacter baumannii NOT DETECTED NOT DETECTED   Enterobacteriaceae species DETECTED (A) NOT DETECTED   Enterobacter cloacae complex NOT DETECTED NOT DETECTED   Escherichia coli NOT DETECTED NOT DETECTED   Klebsiella oxytoca NOT DETECTED NOT DETECTED   Klebsiella pneumoniae DETECTED (A) NOT DETECTED   Proteus species NOT DETECTED NOT DETECTED   Serratia marcescens NOT DETECTED NOT DETECTED   Carbapenem resistance NOT DETECTED NOT DETECTED   Haemophilus influenzae NOT DETECTED NOT DETECTED   Neisseria meningitidis NOT DETECTED NOT DETECTED   Pseudomonas aeruginosa NOT DETECTED NOT DETECTED   Candida albicans NOT DETECTED NOT DETECTED   Candida glabrata NOT DETECTED NOT DETECTED   Candida krusei NOT DETECTED NOT DETECTED   Candida parapsilosis NOT DETECTED NOT DETECTED   Candida tropicalis NOT DETECTED NOT DETECTED    Brain Hilts 10/23/2017  5:08 PM

## 2017-10-23 NOTE — ED Notes (Signed)
Admitting MD advised give 500ML NS Bouls then Ns at 139ml/hr due to BP

## 2017-10-24 ENCOUNTER — Other Ambulatory Visit: Payer: Self-pay | Admitting: *Deleted

## 2017-10-24 DIAGNOSIS — E440 Moderate protein-calorie malnutrition: Secondary | ICD-10-CM

## 2017-10-24 LAB — COMPREHENSIVE METABOLIC PANEL
ALBUMIN: 1.7 g/dL — AB (ref 3.5–5.0)
ALK PHOS: 163 U/L — AB (ref 38–126)
ALT: 59 U/L (ref 17–63)
AST: 59 U/L — ABNORMAL HIGH (ref 15–41)
Anion gap: 9 (ref 5–15)
BUN: 29 mg/dL — ABNORMAL HIGH (ref 6–20)
CHLORIDE: 101 mmol/L (ref 101–111)
CO2: 20 mmol/L — AB (ref 22–32)
CREATININE: 1.12 mg/dL (ref 0.61–1.24)
Calcium: 8 mg/dL — ABNORMAL LOW (ref 8.9–10.3)
GFR calc non Af Amer: >60 mL/min (ref >60)
GLUCOSE: 90 mg/dL (ref 65–99)
Potassium: 3.5 mmol/L (ref 3.5–5.1)
SODIUM: 130 mmol/L — AB (ref 135–145)
Total Bilirubin: 0.8 mg/dL (ref 0.3–1.2)
Total Protein: 5.4 g/dL — ABNORMAL LOW (ref 6.5–8.1)

## 2017-10-24 LAB — CBC
HCT: 27.7 % — ABNORMAL LOW (ref 39.0–52.0)
HEMOGLOBIN: 9.4 g/dL — AB (ref 13.0–17.0)
MCH: 30.9 pg (ref 26.0–34.0)
MCHC: 33.9 g/dL (ref 30.0–36.0)
MCV: 91.1 fL (ref 78.0–100.0)
PLATELETS: 309 10*3/uL (ref 150–400)
RBC: 3.04 MIL/uL — AB (ref 4.22–5.81)
RDW: 15.4 % (ref 11.5–15.5)
WBC: 22.1 10*3/uL — ABNORMAL HIGH (ref 4.0–10.5)

## 2017-10-24 LAB — MAGNESIUM: Magnesium: 2 mg/dL (ref 1.7–2.4)

## 2017-10-24 MED ORDER — BOOST / RESOURCE BREEZE PO LIQD CUSTOM
1.0000 | Freq: Three times a day (TID) | ORAL | Status: DC
  Administered 2017-10-24 – 2017-10-25 (×4): 1 via ORAL

## 2017-10-24 MED ORDER — SODIUM CHLORIDE 0.9 % IV SOLN
2.0000 g | Freq: Three times a day (TID) | INTRAVENOUS | Status: DC
  Filled 2017-10-24: qty 2

## 2017-10-24 MED ORDER — SODIUM CHLORIDE 0.9 % IV SOLN
INTRAVENOUS | Status: DC
  Administered 2017-10-24 – 2017-10-25 (×3): via INTRAVENOUS

## 2017-10-24 MED ORDER — SODIUM CHLORIDE 0.9 % IV SOLN
1.0000 g | Freq: Three times a day (TID) | INTRAVENOUS | Status: DC
  Administered 2017-10-24 – 2017-10-25 (×3): 1 g via INTRAVENOUS
  Filled 2017-10-24 (×4): qty 1

## 2017-10-24 NOTE — Progress Notes (Signed)
[facility] TEAM 1 - Stepdown/ICU TEAM  Joe Dawson  OMV:[id number] DOB: [birth year] DOA: 10/23/2017 PCP: Gayland Curry, DO    Brief Narrative:  67yo M w/ a hx of Advanced dementia, MS, and HTN who was brought to the ED for fever and chills.    In the ED he was found to have a temp of 104.3, WBC 13, and Lactate 4.99.  CXR suggested a RLL PNA v/s atx.  UA suggested a UTI.  Significant Events: 6/3 admit   Subjective: Much more alert and interactive.  Asks some appropriate questions and appears to understand responses.  Does not appear uncomfortable.  Denies cp, sob, n/v, or abdom pain.    Assessment & Plan:  Sepsis due to Klebsiella pneumoniae Bacteremia and Pyelonephritis Cont empiric merem for now while awaiting sensitivities - BCID reportedly states this is not an ESBL organism, but this is not available for me to confirm so will tx as if it is until actual sensitivities have confirmed this - cont volume expansion and advance diet   Hyponatremia Improving w/ volume expansion - follow  Transamitinitis  Mild shock liver - f/u in AM w/ ongoing volume support  Severe dementia - acute delirium due to toxic metabolic encephalopathy  Mental status improving w/ tx of sepsis   DVT prophylaxis: lovenox  Code Status: DNR - NO CODE Family Communication: spoke w/ wife and brother at bedside  Disposition Plan: transfer to tele bed status - advance diet - begin PT/OT 6/5  Consultants:  none  Antimicrobials:  Cefepime 6/2 Zosyn 6/2  Vanc 6/2 > 6/3 Merem 6/3 >  Objective: Blood pressure 139/85, pulse 74, temperature 98.5 F (36.9 C), temperature source Oral, resp. rate 15, height 5' 7.5" (1.715 m), weight 59.7 kg (131 lb 9.8 oz), SpO2 93 %.  Intake/Output Summary (Last 24 hours) at 10/24/2017 1434 Last data filed at 10/24/2017 1203 Gross per 24 hour  Intake 820 ml  Output 2400 ml  Net -1580 ml   Filed Weights   10/23/17 0135 10/23/17 2020 10/24/17 0521  Weight: 57.6 kg (127 lb)  60.1 kg (132 lb 7.9 oz) 59.7 kg (131 lb 9.8 oz)    Examination: General: No acute respiratory distress Lungs: Clear to auscultation bilaterally without wheezes or crackles Cardiovascular: Regular rate and rhythm without murmur gallop or rub normal S1 and S2 Abdomen: Nontender, nondistended, soft, bowel sounds positive, no rebound, no ascites, no appreciable mass Extremities: No significant cyanosis, clubbing, or edema bilateral lower extremities    CBC: Recent Labs  Lab 10/23/17 0113 10/24/17 0334  WBC 13.1* 22.1*  NEUTROABS 12.9*  --   HGB 11.3* 9.4*  HCT 33.3* 27.7*  MCV 91.7 91.1  PLT 415* 628   Basic Metabolic Panel: Recent Labs  Lab 10/23/17 0113 10/23/17 0502 10/24/17 0334  NA 120* 125* 130*  K 4.4 3.9 3.5  CL 86* 97* 101  CO2 17* 17* 20*  GLUCOSE 119* 124* 90  BUN 37* 36* 29*  CREATININE 1.79* 1.61* 1.12  CALCIUM 8.5* 7.6* 8.0*  MG  --   --  2.0   GFR: Estimated Creatinine Clearance: 53.3 mL/min (by C-G formula based on SCr of 1.12 mg/dL).  Liver Function Tests: Recent Labs  Lab 10/23/17 0113 10/23/17 0502 10/24/17 0334  AST 97* 107* 59*  ALT 57 68* 59  ALKPHOS 285* 216* 163*  BILITOT 0.9 1.0 0.8  PROT 7.4 5.6* 5.4*  ALBUMIN 2.5* 1.9* 1.7*    Coagulation Profile: Recent Labs  Lab  10/23/17 0113  INR 1.39    Recent Results (from the past 240 hour(s))  Culture, blood (Routine x 2)     Status: Abnormal (Preliminary result)   Collection Time: 10/23/17  1:20 AM  Result Value Ref Range Status   Specimen Description BLOOD LEFT WRIST  Final   Special Requests   Final    BOTTLES DRAWN AEROBIC AND ANAEROBIC Blood Culture results may not be optimal due to an inadequate volume of blood received in culture bottles   Culture  Setup Time   Final    GRAM NEGATIVE RODS IN BOTH AEROBIC AND ANAEROBIC BOTTLES Organism ID to follow CRITICAL RESULT CALLED TO, READ BACK BY AND VERIFIED WITH: Hyman Bible PharmD 17:05 10/23/17 ([email or web address])    Culture (A)  Final     KLEBSIELLA PNEUMONIAE SUSCEPTIBILITIES TO FOLLOW Performed at [facility], [place] [address], [place], [place] [address]    Report Status PENDING  Incomplete  Blood Culture ID Panel (Reflexed)     Status: Abnormal   Collection Time: 10/23/17  1:20 AM  Result Value Ref Range Status   Enterococcus species NOT DETECTED NOT DETECTED Final   Listeria monocytogenes NOT DETECTED NOT DETECTED Final   Staphylococcus species NOT DETECTED NOT DETECTED Final   Staphylococcus aureus NOT DETECTED NOT DETECTED Final   Streptococcus species NOT DETECTED NOT DETECTED Final   Streptococcus agalactiae NOT DETECTED NOT DETECTED Final   Streptococcus pneumoniae NOT DETECTED NOT DETECTED Final   Streptococcus pyogenes NOT DETECTED NOT DETECTED Final   Acinetobacter baumannii NOT DETECTED NOT DETECTED Final   Enterobacteriaceae species DETECTED (A) NOT DETECTED Final    Comment: Enterobacteriaceae represent a large family of gram-negative bacteria, not a single organism. CRITICAL RESULT CALLED TO, READ BACK BY AND VERIFIED WITH: Hyman Bible PharmD 17:05 10/23/17 ([email or web address])    Enterobacter cloacae complex NOT DETECTED NOT DETECTED Final   Escherichia coli NOT DETECTED NOT DETECTED Final   Klebsiella oxytoca NOT DETECTED NOT DETECTED Final   Klebsiella pneumoniae DETECTED (A) NOT DETECTED Final    Comment: CRITICAL RESULT CALLED TO, READ BACK BY AND VERIFIED WITH: Hyman Bible PharmD 17:05 10/23/17 ([email or web address])    Proteus species NOT DETECTED NOT DETECTED Final   Serratia marcescens NOT DETECTED NOT DETECTED Final   Carbapenem resistance NOT DETECTED NOT DETECTED Final   Haemophilus influenzae NOT DETECTED NOT DETECTED Final   Neisseria meningitidis NOT DETECTED NOT DETECTED Final   Pseudomonas aeruginosa NOT DETECTED NOT DETECTED Final   Candida albicans NOT DETECTED NOT DETECTED Final   Candida glabrata NOT DETECTED NOT DETECTED Final   Candida krusei NOT DETECTED NOT DETECTED Final   Candida parapsilosis NOT  DETECTED NOT DETECTED Final   Candida tropicalis NOT DETECTED NOT DETECTED Final    Comment: Performed at [facility], [place] [address] [place], [place] [address]  Culture, blood (Routine x 2)     Status: Abnormal (Preliminary result)   Collection Time: 10/23/17  1:25 AM  Result Value Ref Range Status   Specimen Description BLOOD BLOOD RIGHT FOREARM  Final   Special Requests   Final    BOTTLES DRAWN AEROBIC AND ANAEROBIC Blood Culture adequate volume   Culture  Setup Time   Final    GRAM NEGATIVE RODS IN BOTH AEROBIC AND ANAEROBIC BOTTLES CRITICAL VALUE NOTED.  VALUE IS CONSISTENT WITH PREVIOUSLY REPORTED AND CALLED VALUE. Performed at [facility], [place] [address] [place], [place] [address]    Culture KLEBSIELLA PNEUMONIAE (A)  Final  Report Status PENDING  Incomplete  Urine culture     Status: Abnormal (Preliminary result)   Collection Time: 10/23/17  3:01 AM  Result Value Ref Range Status   Specimen Description URINE, RANDOM  Final   Special Requests   Final    NONE Performed at [facility], [address], [place], [place] [address]    Culture >=100,000 COLONIES/mL KLEBSIELLA PNEUMONIAE (A)  Final   Report Status PENDING  Incomplete  MRSA PCR Screening     Status: None   Collection Time: 10/23/17  8:40 PM  Result Value Ref Range Status   MRSA by PCR NEGATIVE NEGATIVE Final    Comment:        The GeneXpert MRSA Assay (FDA approved for NASAL specimens only), is one component of a comprehensive MRSA colonization surveillance program. It is not intended to diagnose MRSA infection nor to guide or monitor treatment for MRSA infections. Performed at [facility], [place] [address] [place],  [address]      Scheduled Meds: . aspirin  81 mg Oral Daily  . citalopram  10 mg Oral Daily  . divalproex  250 mg Oral Daily  . divalproex  500 mg Oral QHS  . enoxaparin (LOVENOX) injection  40 mg Subcutaneous Q24H  . finasteride  5 mg Oral Daily      LOS: 1 day   Cherene Altes, MD [facility] Office  [phone] Pager - Text Page per Amion as per below:  On-Call/Text Page:      [email or web address]      password [email or web address]  If 7PM-7AM, please contact night-coverage [email or web address] Password [facility] 10/24/2017, 2:34 PM

## 2017-10-24 NOTE — Plan of Care (Signed)
  Problem: Education: Goal: Knowledge of General Education information will improve Outcome: Not Progressing   Problem: Health Behavior/Discharge Planning: Goal: Ability to manage health-related needs will improve Outcome: Not Progressing   Problem: Clinical Measurements: Goal: Will remain free from infection Outcome: Not Progressing   Problem: Nutrition: Goal: Adequate nutrition will be maintained Outcome: Not Progressing

## 2017-10-24 NOTE — Consult Note (Signed)
   [facility] CM Inpatient Consult   10/24/2017  Wil URIJAH ARKO January 02, 1951 [id number]  Alert by [place] of the patient's hospitalization.  Patient is a high risk score for unplanned re-admission. Patient is currently active with [facility] for chronic disease management services with [employer]. Patient admitted with Sepsis currently at [facility] level of care.  Patient has been engaged by a [facility] and CSW.  Our community based plan of care has focused on disease management and community resource support.  Patient will receive a post discharge transition of care call and will be evaluated for monthly home visits for assessments and disease process education.  Will follow up with  Inpatient Case Manager aware that [facility] following and follow for disposition and post hospital needs. Of note, [facility] Care Management services does not replace or interfere with any services that are needed or arranged by inpatient case management or social work.  For additional questions or referrals please contact:  Natividad Brood, RN BSN [facility] Liaison  [phone] business mobile phone Toll free office [phone]

## 2017-10-24 NOTE — Progress Notes (Addendum)
Pharmacy Antibiotic Note  Joe Dawson is a 67 y.o. male admitted on 10/23/2017 with Klebsiella bacteremia.  Pharmacy has been consulted for Merrem dosing. Pharmacist spoke with Dr. Thereasa Solo on 6/3 as lab did not detect resistance. MD would like to continue with ESBL coverage for now. 6/3 blood and urine cultures detected klebsiella pneumoniae. Susceptibilities pending.  Plan: Change to Merrem 1g IV every 8 hours based on current renal function.  Monitor for ability to narrow quickly.   Height: 5' 7.5" (171.5 cm) Weight: 131 lb 9.8 oz (59.7 kg) IBW/kg (Calculated) : 67.25  Temp (24hrs), Avg:98.3 F (36.8 C), Min:97.7 F (36.5 C), Max:99.1 F (37.3 C)  Recent Labs  Lab 10/23/17 0113 10/23/17 0130 10/23/17 0343 10/23/17 0502 10/23/17 0648 10/23/17 0910 10/24/17 0334  WBC 13.1*  --   --   --   --   --  22.1*  CREATININE 1.79*  --   --  1.61*  --   --  1.12  LATICACIDVEN  --  4.99* 1.90  --  1.0 1.2  --     Estimated Creatinine Clearance: 53.3 mL/min (by C-G formula based on SCr of 1.12 mg/dL).    Allergies  Allergen Reactions  . Epinephrine     Legs shake    Antimicrobials this admission: Vancomycin 6/3 >> 6/3 Cefepime 6/3 >> 6/3 Merrem 6/3 >>  Dose adjustments this admission:   Microbiology results: 6/3 BCx: klebsiella pneumoniae 6/3 UCx: klebsiella pneumoniae  Thank you for allowing pharmacy to be a part of this patient's care.  Angus Seller, PharmD Pharmacy Resident Clinical Phone for 10/24/2017 until 3:30pm: [phone] If after 3:30pm, please call main pharmacy at [phone] 10/24/2017 11:04 AM

## 2017-10-24 NOTE — Progress Notes (Signed)
Initial Nutrition Assessment  DOCUMENTATION CODES:   Non-severe (moderate) malnutrition in context of chronic illness  INTERVENTION:  Boost Breeze po TID, each supplement provides 250 kcal and 9 grams of protein  When diet is advanced, d/c boost breeze and provide Ensure Enlive BID  NUTRITION DIAGNOSIS:   Moderate Malnutrition related to chronic illness as evidenced by moderate fat depletion, moderate muscle depletion.  GOAL:   Patient will meet greater than or equal to 90% of their needs  MONITOR:   PO intake, Supplement acceptance, Diet advancement  REASON FOR ASSESSMENT:   Malnutrition Screening Tool    ASSESSMENT:   Patient with past medical history significant of Advanced dementia, MS, HTN.  Patient brought in to ED for fever and chills.  Patient has been living at home with wife despite advanced dementia following a hospital stay in March of this year.  Wife pulled him out of SNF due to him loosing weight in SNF apparently. Presents with Severe Sepsis and multiorgan dysfunction secondary to pyleonephritis, AKI, hypotension, hyponatremia, and acute metabolic encephelopathy  RD drawn to patient for positive MST Spoke with patient at bedside. Patient has a history of advanced dementia which seemed to be reflected in the history he provided.  He reports sipping on soup "all day," but when this RD asked patient to clarify further he states he eats soup once or twice a day. Reports poor appetite and that he is not really hungry. Denies weight loss, reports UBW of 145 pounds, but he was 131 pounds upon admission. It appears he was last 145 pounds as of 06/25/2017 and his weight has trended downwards from there, indicating a 14 pound/9.6% severe weight loss over 4 months. Patient consumed about 25% of breakfast tray. He was agreeable to drinking ensure but diet is currently clear liquids.  Will try boost and change with diet advancement.  Labs reviewed:  Na 130 BUN 29, AST  59  Medications reviewed  NUTRITION - FOCUSED PHYSICAL EXAM:    Most Recent Value  Orbital Region  Moderate depletion  Upper Arm Region  Moderate depletion  Thoracic and Lumbar Region  Moderate depletion  Buccal Region  Mild depletion  Temple Region  Mild depletion  Clavicle Bone Region  Moderate depletion  Clavicle and Acromion Bone Region  Moderate depletion  Scapular Bone Region  Moderate depletion  Dorsal Hand  Severe depletion  Patellar Region  Moderate depletion  Anterior Thigh Region  Moderate depletion  Posterior Calf Region  Moderate depletion       Diet Order:   Diet Order           Diet clear liquid Room service appropriate? Yes; Fluid consistency: Thin  Diet effective now          EDUCATION NEEDS:   Education needs have been addressed  Skin:  Skin Assessment: Reviewed RN Assessment  Last BM:  PTA  Height:   Ht Readings from Last 1 Encounters:  10/23/17 5' 7.5" (1.715 m)    Weight:   Wt Readings from Last 1 Encounters:  10/24/17 131 lb 9.8 oz (59.7 kg)    Ideal Body Weight:  68.63 kg  BMI:  Body mass index is 20.31 kg/m.  Estimated Nutritional Needs:   Kcal:  5784-6962 calories (30-35 cal/kg)  Protein:  89-101 grams  Fluid:  >1.5L  Joe Anis. Willie, MS, RD LDN Inpatient Clinical Dietitian Pager [phone]

## 2017-10-24 NOTE — Plan of Care (Signed)
  Problem: Clinical Measurements: Goal: Respiratory complications will improve Outcome: Progressing Goal: Cardiovascular complication will be avoided Outcome: Progressing   Problem: Nutrition: Goal: Adequate nutrition will be maintained Outcome: Progressing   Problem: Coping: Goal: Level of anxiety will decrease Outcome: Progressing   Problem: Education: Goal: Knowledge of General Education information will improve Outcome: Not Progressing   Problem: Health Behavior/Discharge Planning: Goal: Ability to manage health-related needs will improve Outcome: Not Progressing   Problem: Clinical Measurements: Goal: Will remain free from infection Outcome: Not Progressing   Problem: Elimination: Goal: Will not experience complications related to bowel motility Outcome: Not Progressing Goal: Will not experience complications related to urinary retention Outcome: Not Progressing

## 2017-10-25 ENCOUNTER — Ambulatory Visit: Payer: Self-pay

## 2017-10-25 DIAGNOSIS — N179 Acute kidney failure, unspecified: Secondary | ICD-10-CM

## 2017-10-25 DIAGNOSIS — R6520 Severe sepsis without septic shock: Secondary | ICD-10-CM

## 2017-10-25 DIAGNOSIS — H547 Unspecified visual loss: Secondary | ICD-10-CM

## 2017-10-25 DIAGNOSIS — G9341 Metabolic encephalopathy: Secondary | ICD-10-CM

## 2017-10-25 DIAGNOSIS — A419 Sepsis, unspecified organism: Secondary | ICD-10-CM

## 2017-10-25 DIAGNOSIS — J181 Lobar pneumonia, unspecified organism: Secondary | ICD-10-CM

## 2017-10-25 LAB — CULTURE, BLOOD (ROUTINE X 2): SPECIAL REQUESTS: ADEQUATE

## 2017-10-25 LAB — BASIC METABOLIC PANEL
Anion gap: 6 (ref 5–15)
BUN: 20 mg/dL (ref 6–20)
CO2: 22 mmol/L (ref 22–32)
CREATININE: 0.8 mg/dL (ref 0.61–1.24)
Calcium: 7.7 mg/dL — ABNORMAL LOW (ref 8.9–10.3)
Chloride: 105 mmol/L (ref 101–111)
GFR calc Af Amer: >60 mL/min (ref >60)
Glucose, Bld: 104 mg/dL — ABNORMAL HIGH (ref 65–99)
Potassium: 3.2 mmol/L — ABNORMAL LOW (ref 3.5–5.1)
SODIUM: 133 mmol/L — AB (ref 135–145)

## 2017-10-25 LAB — CBC
HCT: 29.3 % — ABNORMAL LOW (ref 39.0–52.0)
Hemoglobin: 10 g/dL — ABNORMAL LOW (ref 13.0–17.0)
MCH: 30.7 pg (ref 26.0–34.0)
MCHC: 34.1 g/dL (ref 30.0–36.0)
MCV: 89.9 fL (ref 78.0–100.0)
PLATELETS: 361 10*3/uL (ref 150–400)
RBC: 3.26 MIL/uL — ABNORMAL LOW (ref 4.22–5.81)
RDW: 15.5 % (ref 11.5–15.5)
WBC: 17.1 10*3/uL — AB (ref 4.0–10.5)

## 2017-10-25 MED ORDER — POTASSIUM CHLORIDE CRYS ER 20 MEQ PO TBCR
40.0000 meq | EXTENDED_RELEASE_TABLET | Freq: Once | ORAL | Status: AC
  Administered 2017-10-25: 40 meq via ORAL
  Filled 2017-10-25: qty 2

## 2017-10-25 MED ORDER — POLYETHYLENE GLYCOL 3350 17 G PO PACK: 17.0000 g | PACK | Freq: Every day | ORAL | Status: DC | PRN

## 2017-10-25 MED ORDER — CEFAZOLIN SODIUM-DEXTROSE 2-4 GM/100ML-% IV SOLN
2.0000 g | Freq: Three times a day (TID) | INTRAVENOUS | Status: DC
Start: 2017-10-25 — End: 2017-10-27
  Administered 2017-10-25 – 2017-10-26 (×3): 2 g via INTRAVENOUS
  Filled 2017-10-25 (×6): qty 100

## 2017-10-25 MED ORDER — SENNOSIDES-DOCUSATE SODIUM 8.6-50 MG PO TABS
1.0000 | ORAL_TABLET | Freq: Two times a day (BID) | ORAL | Status: DC
  Administered 2017-10-25 – 2017-10-26 (×3): 1 via ORAL
  Filled 2017-10-25 (×3): qty 1

## 2017-10-25 NOTE — Progress Notes (Signed)
[facility]                                                                              Patient Demographics  Joe Dawson, is a 67 y.o. male, DOB - [birth year], YPP:[id number]  Admit date - 10/23/2017   Admitting Physician Etta Quill, DO  Outpatient Primary MD for the patient is Gayland Curry, DO  Outpatient specialists:   LOS - 2  days   Medical records reviewed and are as summarized below:    Chief Complaint  Patient presents with  . Fever       Brief summary   Patient is a 67 year old male with advanced dementia, MS, hypertension presented with fevers and chills.  In ED was found to have temp of 104.3, WBC 13, lactate 4.9, chest x-ray showed right lower lung pneumonia versus atelectasis.  UA suggested UTI.   Assessment & Plan    Principal Problem:   Severe sepsis with acute organ dysfunction ([place]) -Patient met sepsis criteria at the time of admission with fever, leukocytosis, lactic acidosis, source likely UTI  -Continue antibiotics per sensitivities -Leukocytosis improving, repeat blood cultures today  Active Problems:  Klebsiella pneumonia bacteremia and pyelonephritis, UTI -Patient was placed on IV meropenem -Per pharmacology, not ESBL, recommended change antibiotics to Ancef .  Will need 2 weeks of antibiotics, transition to oral when patient medically ready for discharge.    Blind, Dementia with behavioral disturbance with acute metabolic encephalopathy -History of severe dementia, acute delirium due to toxic metabolic encephalopathy -Mental status improving     HCAP (healthcare-associated pneumonia) -Chest x-ray on admission showed mild right basilar airspace opacity may reflect pneumonia Continue IV antibiotics, follow-up PA lateral chest x-ray recommended in 3 to 4 weeks.   Acute kidney injury -Likely due to severe sepsis, creatinine 1.61 at the time of admission with lactic acidosis -Continue IV fluid hydration, creatinine  improving to 0.8 today    Hyponatremia Likely due to severe sepsis, sodium 125 at the time of admission with-acute kidney injury -Continue IV fluid hydration, sodium improving 133 today    Transaminitis Possibly worsened due to sepsis, improving    Malnutrition of moderate degree -Nutrition consulted  Hypokalemia -Replaced  Code Status: DNR DVT Prophylaxis:  Lovenox Family Communication: Discussed in detail with the patient, all imaging results, lab results explained to the patient   Disposition Plan: Not medically ready Time Spent in minutes 35 minutes  Procedures:  Chest x-ray  Consultants:   None  Antimicrobials:   IV cefazolin 6/5   Medications  Scheduled Meds: . aspirin  81 mg Oral Daily  . citalopram  10 mg Oral Daily  . divalproex  250 mg Oral Daily  . divalproex  500 mg Oral QHS  . enoxaparin (LOVENOX) injection  40 mg Subcutaneous Q24H  . feeding supplement  1 Container Oral TID BM  . finasteride  5 mg Oral Daily  . senna-docusate  1 tablet Oral BID   Continuous Infusions: . sodium chloride 75 mL/hr at 10/25/17 0915  .  ceFAZolin (ANCEF) IV     PRN Meds:.acetaminophen **OR** acetaminophen, LORazepam, ondansetron **OR** ondansetron (ZOFRAN) IV, polyethylene  glycol, traZODone   Antibiotics   Anti-infectives (From admission, onward)   Start     Dose/Rate Route Frequency Ordered Stop   10/25/17 1700  ceFAZolin (ANCEF) IVPB 2g/100 mL premix     2 g 200 mL/hr over 30 Minutes Intravenous Every 8 hours 10/25/17 1319     10/24/17 1800  meropenem (MERREM) 2 g in sodium chloride 0.9 % 100 mL IVPB  Status:  Discontinued     2 g 200 mL/hr over 30 Minutes Intravenous Every 8 hours 10/24/17 1106 10/24/17 1424   10/24/17 1800  meropenem (MERREM) 1 g in sodium chloride 0.9 % 100 mL IVPB  Status:  Discontinued     1 g 200 mL/hr over 30 Minutes Intravenous Every 8 hours 10/24/17 1424 10/25/17 1319   10/24/17 0600  ceFEPIme (MAXIPIME) 1 g in sodium chloride 0.9 %  100 mL IVPB  Status:  Discontinued     1 g 200 mL/hr over 30 Minutes Intravenous Every 24 hours 10/23/17 0756 10/23/17 1754   10/23/17 2030  meropenem (MERREM) 2 g in sodium chloride 0.9 % 100 mL IVPB  Status:  Discontinued     2 g 200 mL/hr over 30 Minutes Intravenous Every 12 hours 10/23/17 2018 10/24/17 1106   10/23/17 1830  meropenem (MERREM) 2 g in sodium chloride 0.9 % 100 mL IVPB  Status:  Discontinued     2 g 200 mL/hr over 30 Minutes Intravenous Every 12 hours 10/23/17 1821 10/23/17 2018   10/23/17 1300  vancomycin (VANCOCIN) 500 mg in sodium chloride 0.9 % 100 mL IVPB  Status:  Discontinued     500 mg 100 mL/hr over 60 Minutes Intravenous Every 24 hours 10/23/17 0512 10/23/17 1756   10/23/17 0530  ceFEPIme (MAXIPIME) 1 g in sodium chloride 0.9 % 100 mL IVPB     1 g 200 mL/hr over 30 Minutes Intravenous  Once 10/23/17 0512 10/23/17 0629   10/23/17 0130  piperacillin-tazobactam (ZOSYN) IVPB 3.375 g     3.375 g 100 mL/hr over 30 Minutes Intravenous  Once 10/23/17 0129 10/23/17 0214   10/23/17 0130  vancomycin (VANCOCIN) IVPB 1000 mg/200 mL premix     1,000 mg 200 mL/hr over 60 Minutes Intravenous  Once 10/23/17 0129 10/23/17 0242        Subjective:   Joe Dawson was seen and examined today.  Still somewhat confused, difficult to obtain review of system from the patient.  No fevers or chills.  No acute issues overnight.  No nausea vomiting, chest pain.  Objective:   Vitals:   10/24/17 1151 10/24/17 1626 10/24/17 1957 10/25/17 0418  BP: 139/85 128/84 (!) 166/94 (!) 151/91  Pulse: 74 66 77 64  Resp: 15 17 18 13   Temp: 98.5 F (36.9 C) 98.1 F (36.7 C) 98.3 F (36.8 C) 98.2 F (36.8 C)  TempSrc: Oral Oral Oral Oral  SpO2: 93% 97% 95% 96%  Weight:      Height:        Intake/Output Summary (Last 24 hours) at 10/25/2017 1325 Last data filed at 10/25/2017 1045 Gross per 24 hour  Intake 1151.25 ml  Output 1675 ml  Net -523.75 ml     Wt Readings from Last 3  Encounters:  10/24/17 59.7 kg (131 lb 9.8 oz)  08/16/17 57.6 kg (127 lb)  06/25/17 65.8 kg (145 lb)     Exam  General: Alert and oriented x 1, NAD  Eyes  HEENT:  Atraumatic, normocephalic  Cardiovascular: S1 S2 auscultated, Regular  rate and rhythm.  Respiratory: Clear to auscultation bilaterally, no wheezing, rales or rhonchi  Gastrointestinal: Soft, nontender, nondistended, + bowel sounds  Ext: no pedal edema bilaterally  Neuro: no new deficit  Musculoskeletal: No digital cyanosis, clubbing  Skin: No rashes  Psych: dementia   Data Reviewed:  I have personally reviewed following labs and imaging studies  Micro Results Recent Results (from the past 240 hour(s))  Culture, blood (Routine x 2)     Status: Abnormal   Collection Time: 10/23/17  1:20 AM  Result Value Ref Range Status   Specimen Description BLOOD LEFT WRIST  Final   Special Requests   Final    BOTTLES DRAWN AEROBIC AND ANAEROBIC Blood Culture results may not be optimal due to an inadequate volume of blood received in culture bottles   Culture  Setup Time   Final    GRAM NEGATIVE RODS IN BOTH AEROBIC AND ANAEROBIC BOTTLES Organism ID to follow CRITICAL RESULT CALLED TO, READ BACK BY AND VERIFIED WITH: Hyman Bible PharmD 17:05 10/23/17 ([email or web address]) Performed at [facility], [address], [place], [place] [address]    Culture KLEBSIELLA PNEUMONIAE (A)  Final   Report Status 10/25/2017 FINAL  Final   Organism ID, Bacteria KLEBSIELLA PNEUMONIAE  Final      Susceptibility   Klebsiella pneumoniae - MIC*    AMPICILLIN >=32 RESISTANT Resistant     CEFAZOLIN <=4 SENSITIVE Sensitive     CEFEPIME <=1 SENSITIVE Sensitive     CEFTAZIDIME <=1 SENSITIVE Sensitive     CEFTRIAXONE <=1 SENSITIVE Sensitive     CIPROFLOXACIN <=0.25 SENSITIVE Sensitive     GENTAMICIN <=1 SENSITIVE Sensitive     IMIPENEM <=0.25 SENSITIVE Sensitive     TRIMETH/SULFA <=20 SENSITIVE Sensitive     AMPICILLIN/SULBACTAM 8 SENSITIVE  Sensitive     PIP/TAZO <=4 SENSITIVE Sensitive     Extended ESBL NEGATIVE Sensitive     * KLEBSIELLA PNEUMONIAE  Blood Culture ID Panel (Reflexed)     Status: Abnormal   Collection Time: 10/23/17  1:20 AM  Result Value Ref Range Status   Enterococcus species NOT DETECTED NOT DETECTED Final   Listeria monocytogenes NOT DETECTED NOT DETECTED Final   Staphylococcus species NOT DETECTED NOT DETECTED Final   Staphylococcus aureus NOT DETECTED NOT DETECTED Final   Streptococcus species NOT DETECTED NOT DETECTED Final   Streptococcus agalactiae NOT DETECTED NOT DETECTED Final   Streptococcus pneumoniae NOT DETECTED NOT DETECTED Final   Streptococcus pyogenes NOT DETECTED NOT DETECTED Final   Acinetobacter baumannii NOT DETECTED NOT DETECTED Final   Enterobacteriaceae species DETECTED (A) NOT DETECTED Final    Comment: Enterobacteriaceae represent a large family of gram-negative bacteria, not a single organism. CRITICAL RESULT CALLED TO, READ BACK BY AND VERIFIED WITH: Hyman Bible PharmD 17:05 10/23/17 ([email or web address])    Enterobacter cloacae complex NOT DETECTED NOT DETECTED Final   Escherichia coli NOT DETECTED NOT DETECTED Final   Klebsiella oxytoca NOT DETECTED NOT DETECTED Final   Klebsiella pneumoniae DETECTED (A) NOT DETECTED Final    Comment: CRITICAL RESULT CALLED TO, READ BACK BY AND VERIFIED WITH: Hyman Bible PharmD 17:05 10/23/17 ([email or web address])    Proteus species NOT DETECTED NOT DETECTED Final   Serratia marcescens NOT DETECTED NOT DETECTED Final   Carbapenem resistance NOT DETECTED NOT DETECTED Final   Haemophilus influenzae NOT DETECTED NOT DETECTED Final   Neisseria meningitidis NOT DETECTED NOT DETECTED Final   Pseudomonas aeruginosa NOT DETECTED NOT DETECTED Final   Candida albicans NOT DETECTED  NOT DETECTED Final   Candida glabrata NOT DETECTED NOT DETECTED Final   Candida krusei NOT DETECTED NOT DETECTED Final   Candida parapsilosis NOT DETECTED NOT DETECTED Final   Candida tropicalis  NOT DETECTED NOT DETECTED Final    Comment: Performed at [facility], [place] [address], [place], [place] [address]  Culture, blood (Routine x 2)     Status: Abnormal   Collection Time: 10/23/17  1:25 AM  Result Value Ref Range Status   Specimen Description BLOOD BLOOD RIGHT FOREARM  Final   Special Requests   Final    BOTTLES DRAWN AEROBIC AND ANAEROBIC Blood Culture adequate volume   Culture  Setup Time   Final    GRAM NEGATIVE RODS IN BOTH AEROBIC AND ANAEROBIC BOTTLES CRITICAL VALUE NOTED.  VALUE IS CONSISTENT WITH PREVIOUSLY REPORTED AND CALLED VALUE.    Culture (A)  Final    KLEBSIELLA PNEUMONIAE SUSCEPTIBILITIES PERFORMED ON PREVIOUS CULTURE WITHIN THE LAST 5 DAYS. Performed at [facility], [place] [address], [place], [place] [address]    Report Status 10/25/2017 FINAL  Final  Urine culture     Status: Abnormal (Preliminary result)   Collection Time: 10/23/17  3:01 AM  Result Value Ref Range Status   Specimen Description URINE, RANDOM  Final   Special Requests NONE  Final   Culture (A)  Final    >=100,000 COLONIES/mL KLEBSIELLA PNEUMONIAE CULTURE REINCUBATED FOR BETTER GROWTH Performed at [facility], [place] [address] [place], [place] [address]    Report Status PENDING  Incomplete   Organism ID, Bacteria KLEBSIELLA PNEUMONIAE (A)  Final      Susceptibility   Klebsiella pneumoniae - MIC*    AMPICILLIN RESISTANT Resistant     CEFAZOLIN <=4 SENSITIVE Sensitive     CEFTRIAXONE <=1 SENSITIVE Sensitive     CIPROFLOXACIN <=0.25 SENSITIVE Sensitive     GENTAMICIN <=1 SENSITIVE Sensitive     IMIPENEM <=0.25 SENSITIVE Sensitive     NITROFURANTOIN 128 RESISTANT Resistant     TRIMETH/SULFA <=20 SENSITIVE Sensitive     AMPICILLIN/SULBACTAM <=2 SENSITIVE Sensitive     PIP/TAZO <=4 SENSITIVE Sensitive     Extended ESBL NEGATIVE Sensitive     * >=100,000 COLONIES/mL KLEBSIELLA PNEUMONIAE  MRSA PCR Screening     Status: None   Collection Time: 10/23/17  8:40 PM    Result Value Ref Range Status   MRSA by PCR NEGATIVE NEGATIVE Final    Comment:        The GeneXpert MRSA Assay (FDA approved for NASAL specimens only), is one component of a comprehensive MRSA colonization surveillance program. It is not intended to diagnose MRSA infection nor to guide or monitor treatment for MRSA infections. Performed at [facility], [place] [address], [place],  [address]     Radiology Reports Ct Abdomen Pelvis Wo Contrast  Result Date: 10/23/2017 CLINICAL DATA:  Acute onset of severe sepsis. Renal insufficiency and altered mental status. Fever. EXAM: CT ABDOMEN AND PELVIS WITHOUT CONTRAST TECHNIQUE: Multidetector CT imaging of the abdomen and pelvis was performed following the standard protocol without IV contrast. COMPARISON:  Lumbar spine and pelvic radiographs performed 07/18/2011 FINDINGS: Lower chest: Mild right basilar airspace opacity may reflect atelectasis or possibly mild pneumonia. Scattered coronary artery calcifications are seen. Hepatobiliary: The liver is unremarkable in appearance. The gallbladder is unremarkable in appearance. The common bile duct remains normal in caliber. Pancreas: The pancreas is within normal limits. Spleen: The spleen is unremarkable in  appearance. Adrenals/Urinary Tract: The adrenal glands are unremarkable. Asymmetric left-sided perinephric stranding is noted. No significant hydronephrosis is seen. Left-sided pyelonephritis cannot be excluded. Mild fluid is noted tracking about the Gerota's fascia bilaterally. No renal or ureteral stones are identified. Stomach/Bowel: The stomach is unremarkable in appearance. The small bowel is within normal limits. The appendix is normal in caliber, without evidence of appendicitis. The colon is unremarkable in appearance. Fluid is seen tracking along the paracolic gutters bilaterally. Vascular/Lymphatic: Scattered calcification is seen along the abdominal aorta and its branches. The  abdominal aorta is otherwise grossly unremarkable. The inferior vena cava is grossly unremarkable. No retroperitoneal lymphadenopathy is seen. No pelvic sidewall lymphadenopathy is identified. Reproductive: Bladder wall thickening may reflect cystitis. The prostate remains normal in size. Other: No additional soft tissue abnormalities are seen. Musculoskeletal: No acute osseous abnormalities are identified. There is chronic loss of height at vertebral body L4. The visualized musculature is unremarkable in appearance. IMPRESSION: 1. Mild right basilar airspace opacity may reflect atelectasis or possibly mild pneumonia. 2. Asymmetric left-sided perinephric stranding noted. Left-sided pyelonephritis cannot be excluded. 3. Bladder wall thickening may reflect cystitis. 4. Fluid tracks along the paracolic gutters bilaterally, of uncertain significance. 5. Scattered coronary artery calcifications seen. Aortic Atherosclerosis (ICD10-I70.0). Electronically Signed   By: Garald Balding M.D.   On: 10/23/2017 05:39   Dg Chest Port 1 View  Result Date: 10/23/2017 CLINICAL DATA:  Acute onset of fever. Dark urine. EXAM: PORTABLE CHEST 1 VIEW COMPARISON:  Chest radiograph performed 08/16/2017 FINDINGS: The lungs are hypoexpanded. Mild right basilar opacity may reflect pneumonia. There is no evidence of pleural effusion or pneumothorax. The cardiomediastinal silhouette is borderline normal in size. No acute osseous abnormalities are seen. IMPRESSION: Lungs hypoexpanded. Mild right basilar airspace opacity may reflect pneumonia. This is in a similar location to the prior opacity. Followup PA and lateral chest X-ray is recommended in 3-4 weeks following trial of antibiotic therapy to ensure resolution and exclude underlying malignancy. Electronically Signed   By: Garald Balding M.D.   On: 10/23/2017 01:57    Lab Data:  CBC: Recent Labs  Lab 10/23/17 0113 10/24/17 0334 10/25/17 0439  WBC 13.1* 22.1* 17.1*  NEUTROABS 12.9*   --   --   HGB 11.3* 9.4* 10.0*  HCT 33.3* 27.7* 29.3*  MCV 91.7 91.1 89.9  PLT 415* 309 272   Basic Metabolic Panel: Recent Labs  Lab 10/23/17 0113 10/23/17 0502 10/24/17 0334 10/25/17 0439  NA 120* 125* 130* 133*  K 4.4 3.9 3.5 3.2*  CL 86* 97* 101 105  CO2 17* 17* 20* 22  GLUCOSE 119* 124* 90 104*  BUN 37* 36* 29* 20  CREATININE 1.79* 1.61* 1.12 0.80  CALCIUM 8.5* 7.6* 8.0* 7.7*  MG  --   --  2.0  --    GFR: Estimated Creatinine Clearance: 74.6 mL/min (by C-G formula based on SCr of 0.8 mg/dL). Liver Function Tests: Recent Labs  Lab 10/23/17 0113 10/23/17 0502 10/24/17 0334  AST 97* 107* 59*  ALT 57 68* 59  ALKPHOS 285* 216* 163*  BILITOT 0.9 1.0 0.8  PROT 7.4 5.6* 5.4*  ALBUMIN 2.5* 1.9* 1.7*   No results for input(s): LIPASE, AMYLASE in the last 168 hours. No results for input(s): AMMONIA in the last 168 hours. Coagulation Profile: Recent Labs  Lab 10/23/17 0113  INR 1.39   Cardiac Enzymes: No results for input(s): CKTOTAL, CKMB, CKMBINDEX, TROPONINI in the last 168 hours. BNP (last 3 results) No results for  input(s): PROBNP in the last 8760 hours. HbA1C: No results for input(s): HGBA1C in the last 72 hours. CBG: No results for input(s): GLUCAP in the last 168 hours. Lipid Profile: No results for input(s): CHOL, HDL, LDLCALC, TRIG, CHOLHDL, LDLDIRECT in the last 72 hours. Thyroid Function Tests: No results for input(s): TSH, T4TOTAL, FREET4, T3FREE, THYROIDAB in the last 72 hours. Anemia Panel: No results for input(s): VITAMINB12, FOLATE, FERRITIN, TIBC, IRON, RETICCTPCT in the last 72 hours. Urine analysis:    Component Value Date/Time   COLORURINE AMBER (A) 10/23/2017 0301   APPEARANCEUR CLOUDY (A) 10/23/2017 0301   LABSPEC 1.009 10/23/2017 0301   PHURINE 7.0 10/23/2017 0301   GLUCOSEU NEGATIVE 10/23/2017 0301   HGBUR MODERATE (A) 10/23/2017 0301   BILIRUBINUR NEGATIVE 10/23/2017 0301   KETONESUR NEGATIVE 10/23/2017 0301   PROTEINUR 100  (A) 10/23/2017 0301   NITRITE NEGATIVE 10/23/2017 0301   LEUKOCYTESUR LARGE (A) 10/23/2017 0301     Jazzmine Kleiman M.D. [facility] 10/25/2017, 1:25 PM  Pager: [phone] Between 7am to 7pm - call Pager - [phone]  After 7pm go to [email or web address] - password [email or web address]  Call night coverage person covering after 7pm

## 2017-10-25 NOTE — Evaluation (Addendum)
Physical Therapy Evaluation Patient Details Name: Joe Dawson MRN: [id number] DOB: [birth year] Today's Date: 10/25/2017   History of Present Illness  67yo M w/ a hx ofAdvanced dementia, MS, and HTN who was brought to the ED for fever and chills.   Clinical Impression  Pt admitted with above diagnosis. Pt currently with functional limitations due to the deficits listed below (see PT Problem List). Pt refused to get OOB and resisting all mobility.  Pt hitting PT's hands repeatedly when mobility attempted.  Assisted pt back to supine per pt request.  Will follow acutely as able.   Pt will benefit from skilled PT to increase their independence and safety with mobility to allow discharge to the venue listed below.      Follow Up Recommendations Home health PT;Supervision/Assistance - 24 hour(if wife can take him home, max HH support)    Equipment Recommendations  Other (comment)(TBA)    Recommendations for Other Services       Precautions / Restrictions Precautions Precautions: Fall Restrictions Weight Bearing Restrictions: No      Mobility  Bed Mobility Overal bed mobility: Needs Assistance Bed Mobility: Supine to Sit     Supine to sit: +2 for physical assistance;Max assist;Total assist     General bed mobility comments: As PT attempting to assist pt to EOB assisting by moving LEs off bed, pt reached to hold onto PT's hands and then proceeded to slap PT's hands repeatedly and pushing away.  Resisting all movement.  Pt then said, " I want to stay in bed."  Assisted pt back to bed.   Transfers                 General transfer comment: unable as pt would not cooperate with mobility.   Ambulation/Gait                Stairs            Wheelchair Mobility    Modified Rankin (Stroke Patients Only)       Balance Overall balance assessment: Needs assistance Sitting-balance support: Bilateral upper extremity supported;Feet supported Sitting balance-Leahy  Scale: Poor Sitting balance - Comments: unable to achieve.  Leans posterior and resists sitting EOB.  Postural control: Posterior lean                                   Pertinent Vitals/Pain Pain Assessment: No/denies pain    Home Living Family/patient expects to be discharged to:: Private residence Living Arrangements: Spouse/significant other Available Help at Discharge: Family Type of Home: House Home Access: Ramped entrance     Home Layout: One level Home Equipment: Environmental consultant - 2 wheels;Wheelchair - manual;Bedside commode Additional Comments: Wife had been caring for pt per chart after she pulled him out of SNF.      Prior Function Level of Independence: Needs assistance   Gait / Transfers Assistance Needed: walking ~20 feet with a RW and PT  ADL's / Homemaking Assistance Needed: wife assists        Hand Dominance        Extremity/Trunk Assessment   Upper Extremity Assessment Upper Extremity Assessment: Defer to OT evaluation    Lower Extremity Assessment Lower Extremity Assessment: Generalized weakness    Cervical / Trunk Assessment Cervical / Trunk Assessment: Kyphotic  Communication   Communication: (soft spoken)  Cognition Arousal/Alertness: Awake/alert Behavior During Therapy: Flat affect Overall Cognitive Status: History of cognitive impairments -  at baseline                                 General Comments: Advanced dementia. confused and unable to follow questioning and commands      General Comments      Exercises     Assessment/Plan    PT Assessment Patient needs continued PT services  PT Problem List Decreased activity tolerance;Decreased balance;Decreased mobility;Decreased knowledge of use of DME;Decreased safety awareness;Decreased knowledge of precautions;Decreased cognition       PT Treatment Interventions DME instruction;Gait training;Functional mobility training;Therapeutic activities;Therapeutic  exercise;Balance training;Patient/family education    PT Goals (Current goals can be found in the Care Plan section)  Acute Rehab PT Goals Patient Stated Goal: unable to state PT Goal Formulation: Patient unable to participate in goal setting Time For Goal Achievement: 11/08/17 Potential to Achieve Goals: Good    Frequency Min 2X/week   Barriers to discharge        Co-evaluation               AM-PAC PT "6 Clicks" Daily Activity  Outcome Measure Difficulty turning over in bed (including adjusting bedclothes, sheets and blankets)?: Unable Difficulty moving from lying on back to sitting on the side of the bed? : Unable Difficulty sitting down on and standing up from a chair with arms (e.g., wheelchair, bedside commode, etc,.)?: Unable Help needed moving to and from a bed to chair (including a wheelchair)?: Total Help needed walking in hospital room?: Total Help needed climbing 3-5 steps with a railing? : Total 6 Click Score: 6    End of Session   Activity Tolerance: Patient limited by fatigue(self limiting) Patient left: in bed;with call bell/phone within reach;with bed alarm set Nurse Communication: Mobility status PT Visit Diagnosis: Unsteadiness on feet (R26.81);Muscle weakness (generalized) (M62.81)    Time: 5027-7412 PT Time Calculation (min) (ACUTE ONLY): 19 min   Charges:   PT Evaluation $PT Eval Moderate Complexity: 1 Mod     PT G Codes:        Jameela Michna,PT Acute Rehabilitation [phone] (pager)   Denice Paradise 10/25/2017, 11:49 AM

## 2017-10-25 NOTE — Evaluation (Signed)
Occupational Therapy Evaluation Patient Details Name: Joe Dawson MRN: [id number] DOB: 08/28/1950 Today's Date: 10/25/2017    History of Present Illness 67yo M w/ a hx ofAdvanced dementia, MS, and HTN who was brought to the ED for fever and chills.    Clinical Impression   PTA, pt was living at home with his wife who was assisting with all ADLs and functional transfer with assistance from a PCA 8 hours per week. Pt currently requiring Max-Total A for ADLs. Pt agreeable to washing his face at bed level. Once attempting to perform bed mobility to EOB, pt beginning to become frustrated so returned to supine. Discussed with pt's wife and needs for home and her goals. Pt would benefit from further acute OT to facilitate safe dc. Recommend dc to home with HHOT for further OT to optimize safety, independence with ADLs, and return to PLOF.      Follow Up Recommendations  Home health OT;Supervision/Assistance - 24 hour    Equipment Recommendations  Hospital bed    Recommendations for Other Services PT consult     Precautions / Restrictions Precautions Precautions: Fall Restrictions Weight Bearing Restrictions: No      Mobility Bed Mobility Overal bed mobility: Needs Assistance Bed Mobility: Supine to Sit           General bed mobility comments: Pt slowing moving towards EOB with Min A, but then wanting to return to bed. Not attempting bed mobility for safety  Transfers                 General transfer comment: Not attempted    Balance                                           ADL either performed or assessed with clinical judgement   ADL Overall ADL's : Needs assistance/impaired Eating/Feeding: Maximal assistance;With caregiver independent assisting;Bed level Eating/Feeding Details (indicate cue type and reason): Sister assisting pt to drink from cup Grooming: Bed level;Min guard;Wash/dry face Grooming Details (indicate cue type and reason): Pt  washing his face at bed level with MI ncues Upper Body Bathing: Total assistance;Bed level   Lower Body Bathing: Total assistance;Bed level   Upper Body Dressing : Total assistance;Bed level   Lower Body Dressing: Total assistance;Bed level                 General ADL Comments: Pt requiring total A for ADLs     Vision Baseline Vision/History: Legally blind Patient Visual Report: No change from baseline       Perception     Praxis      Pertinent Vitals/Pain Pain Assessment: Faces Faces Pain Scale: No hurt Pain Intervention(s): Monitored during session     Hand Dominance     Extremity/Trunk Assessment Upper Extremity Assessment Upper Extremity Assessment: Generalized weakness   Lower Extremity Assessment Lower Extremity Assessment: Defer to PT evaluation   Cervical / Trunk Assessment Cervical / Trunk Assessment: Kyphotic   Communication Communication Communication: (soft spoken)   Cognition Arousal/Alertness: Awake/alert Behavior During Therapy: Flat affect Overall Cognitive Status: History of cognitive impairments - at baseline                                 General Comments: Advanced dementia. confused. Inconsistantly answering yes no questions.    General  Comments  Wife present throughout session providing home set up and PLOF    Exercises     Shoulder Instructions      Home Living Family/patient expects to be discharged to:: Private residence Living Arrangements: Spouse/significant other Available Help at Discharge: Family;Available 24 hours/day Type of Home: House Home Access: Ramped entrance     Home Layout: One level     Bathroom Shower/Tub: Teacher, early years/pre: Standard     Home Equipment: Environmental consultant - 2 wheels;Wheelchair - [employer];Tub bench;Grab bars - tub/shower;Hand held shower head   Additional Comments: Wife providing home set up information      Prior Functioning/Environment Level  of Independence: Needs assistance  Gait / Transfers Assistance Needed: Stand pivot with assistance ADL's / Homemaking Assistance Needed: wife and aide assist   Comments: 8 hours PCA each week.         OT Problem List: Decreased strength;Decreased range of motion;Decreased activity tolerance;Impaired balance (sitting and/or standing);Impaired vision/perception;Decreased cognition;Decreased safety awareness;Decreased knowledge of precautions;Decreased knowledge of use of DME or AE      OT Treatment/Interventions: Self-care/ADL training;Therapeutic exercise;Energy conservation;DME and/or AE instruction;Therapeutic activities;Patient/family education    OT Goals(Current goals can be found in the care plan section) Acute Rehab OT Goals Patient Stated Goal: Wife "Stand pivot transfer so we can go home"  OT Frequency: Min 2X/week   Barriers to D/C:            Co-evaluation              AM-PAC PT "6 Clicks" Daily Activity     Outcome Measure Help from another person eating meals?: Total Help from another person taking care of personal grooming?: Total Help from another person toileting, which includes using toliet, bedpan, or urinal?: Total Help from another person bathing (including washing, rinsing, drying)?: Total Help from another person to put on and taking off regular upper body clothing?: Total Help from another person to put on and taking off regular lower body clothing?: Total 6 Click Score: 6   End of Session Nurse Communication: Mobility status  Activity Tolerance: Patient tolerated treatment well Patient left: in bed;with call bell/phone within reach;with bed alarm set;with family/visitor present  OT Visit Diagnosis: Unsteadiness on feet (R26.81);Other abnormalities of gait and mobility (R26.89);Muscle weakness (generalized) (M62.81);Other symptoms and signs involving cognitive function                Time: 6160-7371 OT Time Calculation (min): 26 min Charges:  OT  General Charges $OT Visit: 1 Visit OT Evaluation $OT Eval Moderate Complexity: 1 Mod OT Treatments $Self Care/Home Management : 8-22 mins G-Codes:     Tamura Lasky MSOT, OTR/L Acute Rehab Pager: [phone] Office: [place] 10/25/2017, 3:50 PM

## 2017-10-26 LAB — URINE CULTURE

## 2017-10-26 LAB — CBC
HCT: 30.1 % — ABNORMAL LOW (ref 39.0–52.0)
Hemoglobin: 10.1 g/dL — ABNORMAL LOW (ref 13.0–17.0)
MCH: 31 pg (ref 26.0–34.0)
MCHC: 33.6 g/dL (ref 30.0–36.0)
MCV: 92.3 fL (ref 78.0–100.0)
PLATELETS: 344 10*3/uL (ref 150–400)
RBC: 3.26 MIL/uL — ABNORMAL LOW (ref 4.22–5.81)
RDW: 15.7 % — AB (ref 11.5–15.5)
WBC: 12.9 10*3/uL — ABNORMAL HIGH (ref 4.0–10.5)

## 2017-10-26 LAB — BASIC METABOLIC PANEL
Anion gap: 8 (ref 5–15)
BUN: 14 mg/dL (ref 6–20)
CALCIUM: 8 mg/dL — AB (ref 8.9–10.3)
CO2: 23 mmol/L (ref 22–32)
CREATININE: 0.73 mg/dL (ref 0.61–1.24)
Chloride: 107 mmol/L (ref 101–111)
GFR calc Af Amer: >60 mL/min (ref >60)
GLUCOSE: 101 mg/dL — AB (ref 65–99)
Potassium: 3.7 mmol/L (ref 3.5–5.1)
Sodium: 138 mmol/L (ref 135–145)

## 2017-10-26 MED ORDER — CIPROFLOXACIN HCL 500 MG PO TABS: 500.0000 mg | ORAL_TABLET | Freq: Two times a day (BID) | ORAL | 0 refills | Status: AC

## 2017-10-26 MED ORDER — TRAZODONE HCL 50 MG PO TABS: 50.0000 mg | ORAL_TABLET | Freq: Every evening | ORAL | 3 refills | Status: AC | PRN

## 2017-10-26 MED ORDER — DIVALPROEX SODIUM 125 MG PO CSDR: 250.0000 mg | DELAYED_RELEASE_CAPSULE | Freq: Every day | ORAL | 3 refills | Status: AC

## 2017-10-26 MED ORDER — LISINOPRIL 20 MG PO TABS: 20.0000 mg | ORAL_TABLET | Freq: Every day | ORAL | 3 refills | Status: DC

## 2017-10-26 MED ORDER — TAMSULOSIN HCL 0.4 MG PO CAPS: 0.4000 mg | ORAL_CAPSULE | Freq: Every day | ORAL | 3 refills | Status: DC

## 2017-10-26 MED ORDER — CITALOPRAM HYDROBROMIDE 10 MG PO TABS: 10.0000 mg | ORAL_TABLET | Freq: Every day | ORAL | 0 refills | Status: DC

## 2017-10-26 MED ORDER — LORAZEPAM 0.5 MG PO TABS: 0.5000 mg | ORAL_TABLET | Freq: Every day | ORAL | 0 refills | Status: AC | PRN

## 2017-10-26 MED ORDER — FINASTERIDE 5 MG PO TABS: 5.0000 mg | ORAL_TABLET | Freq: Every day | ORAL | 3 refills | Status: AC

## 2017-10-26 NOTE — Progress Notes (Signed)
Joe Dawson to be D/C'd Home per MD order. Discussed with the patient and all questions fully answered.    IV catheter discontinued intact. Site without signs and symptoms of complications. Dressing and pressure applied.  An After Visit Summary was printed and given to the patient.   Foley bag changed to leg bag and wife educated.  PTAR called for transport home. [name]  10/26/2017 7:03 PM

## 2017-10-26 NOTE — Progress Notes (Signed)
   Durable Medical Equipment (From admission, onward)     Start     Ordered  10/26/17 0906   For home use only DME Hospital bed  Once   Question Answer Comment Patient has (list medical condition): Advanced dementia, pyelonephritis with Klebsiella bacteremia  The above medical condition requires: Patient requires the ability to reposition frequently  Head must be elevated greater than: 30 degrees  Bed type Semi-electric  Support Surface: Low Air loss Mattress    10/26/17 0906

## 2017-10-26 NOTE — Discharge Summary (Addendum)
Physician Discharge Summary   Patient ID: Joe Dawson MRN: [id number] DOB/AGE: May 15, 1951 67 y.o.  Admit date: 10/23/2017 Discharge date: 10/26/2017  Primary Care Physician:  Gayland Curry, DO   Recommendations for Outpatient Follow-up:  1. Follow up with PCP in 1-2 weeks 2. Please obtain BMET at the follow-up appointment 3. Follow-up PA lateral chest x-ray in 3 to 4 weeks  Home Health: Home health PT, OT, home health RN, aide Equipment/Devices: Hospital bed ordered  Discharge Condition: stable CODE STATUS: DNR Diet recommendation: Regular   Discharge Diagnoses:    . Sepsis secondary to UTI ([place]) . Acute metabolic encephalopathy . Dementia with behavioral disturbance . Klebsiella pneumonia bacteremia and pyelonephritis . HCAP (healthcare-associated pneumonia) . Moderate malnutrition . Hyponatremia . Transaminitis Hypokalemia   Consults: None    Allergies:   Allergies  Allergen Reactions  . Epinephrine     Legs shake     DISCHARGE MEDICATIONS: Allergies as of 10/26/2017      Reactions   Epinephrine    Legs shake      Medication List    TAKE these medications   acetaminophen 325 MG tablet Commonly known as:  TYLENOL Take 650 mg by mouth 2 (two) times daily.   aspirin 81 MG tablet Take 81 mg by mouth daily.   ciprofloxacin 500 MG tablet Commonly known as:  CIPRO Take 1 tablet (500 mg total) by mouth 2 (two) times daily for 11 days.   citalopram 10 MG tablet Commonly known as:  CELEXA Take 1 tablet (10 mg total) by mouth daily.   divalproex 125 MG capsule Commonly known as:  DEPAKOTE SPRINKLE Take 2 capsules (250 mg total) by mouth daily. What changed:  See the new instructions.   finasteride 5 MG tablet Commonly known as:  PROSCAR Take 1 tablet (5 mg total) by mouth daily.   lisinopril 20 MG tablet Commonly known as:  PRINIVIL,ZESTRIL Take 1 tablet (20 mg total) by mouth daily.   LORazepam 0.5 MG tablet Commonly known as:   ATIVAN Take 1 tablet (0.5 mg total) by mouth daily as needed for anxiety.   tamsulosin 0.4 MG Caps capsule Commonly known as:  FLOMAX Take 1 capsule (0.4 mg total) by mouth daily. What changed:  See the new instructions.   traZODone 50 MG tablet Commonly known as:  DESYREL Take 1 tablet (50 mg total) by mouth at bedtime as needed for sleep.            Durable Medical Equipment  (From admission, onward)        Start     Ordered   10/26/17 0906  For home use only DME Hospital bed  Once    Question Answer Comment  Patient has (list medical condition): Advanced dementia, pyelonephritis with Klebsiella bacteremia   The above medical condition requires: Patient requires the ability to reposition frequently   Head must be elevated greater than: 30 degrees   Bed type Semi-electric   Support Surface: Low Air loss Mattress      10/26/17 0906       Brief H and P: For complete details please refer to admission H and P, but in brief Patient is a 67 year old male with advanced dementia, MS, hypertension presented with fevers and chills.  In ED was found to have temp of 104.3, WBC 13, lactate 4.9, chest x-ray showed right lower lung pneumonia versus atelectasis.  UA suggested UTI.  Hospital Course:   Severe sepsis with acute organ dysfunction [facility]) -Patient  met sepsis criteria at the time of admission with fever, leukocytosis, lactic acidosis, source likely UTI  -Sepsis physiology resolved, leukocytosis improving.  Presented with lactic acidosis 4.9, white count of 22.1.  WBC is 12.9 at the time of discharge.  Klebsiella pneumonia bacteremia and pyelonephritis, UTI -Patient was placed on IV meropenem -Per sensitivities, not ESBL, IV antibiotics were changed to Ancef and transitioned to oral ciprofloxacin for 11 more days for 2 weeks course for bacteremia and pyelonephritis.   -Repeat blood cultures negative so far     Blind, Dementia with behavioral disturbance with acute  metabolic encephalopathy -History of severe dementia, acute delirium due to toxic metabolic encephalopathy and sepsis -Mental status improving, appears close to baseline     HCAP (healthcare-associated pneumonia) -Chest x-ray on admission showed mild right basilar airspace opacity, may reflect pneumonia -Patient has received 4 days of IV antibiotics, lungs clear, recommend follow-up PA lateral chest x-ray recommended in 3 to 4 weeks.   Acute kidney injury -Likely due to severe sepsis, creatinine 1.61 at the time of admission with lactic acidosis -Creatinine 0.7 at the time of discharge, improved    Hyponatremia Likely due to severe sepsis, sodium 125 at the time of admission with-acute kidney injury -Patient was placed on IV fluid hydration, sodium improved to 138 at the time of discharge.    Transaminitis Possibly worsened due to sepsis, improving    Malnutrition of moderate degree -Nutrition consulted, placed on dietary supplements   Day of Discharge S: No fevers, mental status improving, has advanced dementia.  Difficulty obtaining review of system.  BP (!) 162/109 (BP Location: Right Arm)   Pulse (!) 58   Temp 98.7 F (37.1 C) (Oral)   Resp 15   Ht 5' 7.5" (1.715 m)   Wt 59.7 kg (131 lb 9.8 oz)   SpO2 94%   BMI 20.31 kg/m   Physical Exam: General: Alert and awake oriented to self, not in any acute distress. HEENT: anicteric sclera, pupils reactive to light and accommodation CVS: S1-S2 clear no murmur rubs or gallops Chest: clear to auscultation bilaterally, no wheezing rales or rhonchi Abdomen: soft nontender, nondistended, normal bowel sounds Extremities: no cyanosis, clubbing or edema noted bilaterally Neuro: moving all 4 extremities spontaneously   The results of significant diagnostics from this hospitalization (including imaging, microbiology, ancillary and laboratory) are listed below for reference.      Procedures/Studies:  Ct Abdomen Pelvis Wo  Contrast  Result Date: 10/23/2017 CLINICAL DATA:  Acute onset of severe sepsis. Renal insufficiency and altered mental status. Fever. EXAM: CT ABDOMEN AND PELVIS WITHOUT CONTRAST TECHNIQUE: Multidetector CT imaging of the abdomen and pelvis was performed following the standard protocol without IV contrast. COMPARISON:  Lumbar spine and pelvic radiographs performed 07/18/2011 FINDINGS: Lower chest: Mild right basilar airspace opacity may reflect atelectasis or possibly mild pneumonia. Scattered coronary artery calcifications are seen. Hepatobiliary: The liver is unremarkable in appearance. The gallbladder is unremarkable in appearance. The common bile duct remains normal in caliber. Pancreas: The pancreas is within normal limits. Spleen: The spleen is unremarkable in appearance. Adrenals/Urinary Tract: The adrenal glands are unremarkable. Asymmetric left-sided perinephric stranding is noted. No significant hydronephrosis is seen. Left-sided pyelonephritis cannot be excluded. Mild fluid is noted tracking about the Gerota's fascia bilaterally. No renal or ureteral stones are identified. Stomach/Bowel: The stomach is unremarkable in appearance. The small bowel is within normal limits. The appendix is normal in caliber, without evidence of appendicitis. The colon is unremarkable in appearance. Fluid is  seen tracking along the paracolic gutters bilaterally. Vascular/Lymphatic: Scattered calcification is seen along the abdominal aorta and its branches. The abdominal aorta is otherwise grossly unremarkable. The inferior vena cava is grossly unremarkable. No retroperitoneal lymphadenopathy is seen. No pelvic sidewall lymphadenopathy is identified. Reproductive: Bladder wall thickening may reflect cystitis. The prostate remains normal in size. Other: No additional soft tissue abnormalities are seen. Musculoskeletal: No acute osseous abnormalities are identified. There is chronic loss of height at vertebral body L4. The  visualized musculature is unremarkable in appearance. IMPRESSION: 1. Mild right basilar airspace opacity may reflect atelectasis or possibly mild pneumonia. 2. Asymmetric left-sided perinephric stranding noted. Left-sided pyelonephritis cannot be excluded. 3. Bladder wall thickening may reflect cystitis. 4. Fluid tracks along the paracolic gutters bilaterally, of uncertain significance. 5. Scattered coronary artery calcifications seen. Aortic Atherosclerosis (ICD10-I70.0). Electronically Signed   By: Garald Balding M.D.   On: 10/23/2017 05:39   Dg Chest Port 1 View  Result Date: 10/23/2017 CLINICAL DATA:  Acute onset of fever. Dark urine. EXAM: PORTABLE CHEST 1 VIEW COMPARISON:  Chest radiograph performed 08/16/2017 FINDINGS: The lungs are hypoexpanded. Mild right basilar opacity may reflect pneumonia. There is no evidence of pleural effusion or pneumothorax. The cardiomediastinal silhouette is borderline normal in size. No acute osseous abnormalities are seen. IMPRESSION: Lungs hypoexpanded. Mild right basilar airspace opacity may reflect pneumonia. This is in a similar location to the prior opacity. Followup PA and lateral chest X-ray is recommended in 3-4 weeks following trial of antibiotic therapy to ensure resolution and exclude underlying malignancy. Electronically Signed   By: Garald Balding M.D.   On: 10/23/2017 01:57      LAB RESULTS: Basic Metabolic Panel: Recent Labs  Lab 10/24/17 0334 10/25/17 0439 10/26/17 0314  NA 130* 133* 138  K 3.5 3.2* 3.7  CL 101 105 107  CO2 20* 22 23  GLUCOSE 90 104* 101*  BUN 29* 20 14  CREATININE 1.12 0.80 0.73  CALCIUM 8.0* 7.7* 8.0*  MG 2.0  --   --    Liver Function Tests: Recent Labs  Lab 10/23/17 0502 10/24/17 0334  AST 107* 59*  ALT 68* 59  ALKPHOS 216* 163*  BILITOT 1.0 0.8  PROT 5.6* 5.4*  ALBUMIN 1.9* 1.7*   No results for input(s): LIPASE, AMYLASE in the last 168 hours. No results for input(s): AMMONIA in the last 168  hours. CBC: Recent Labs  Lab 10/23/17 0113  10/25/17 0439 10/26/17 0314  WBC 13.1*   < > 17.1* 12.9*  NEUTROABS 12.9*  --   --   --   HGB 11.3*   < > 10.0* 10.1*  HCT 33.3*   < > 29.3* 30.1*  MCV 91.7   < > 89.9 92.3  PLT 415*   < > 361 344   < > = values in this interval not displayed.   Cardiac Enzymes: No results for input(s): CKTOTAL, CKMB, CKMBINDEX, TROPONINI in the last 168 hours. BNP: Invalid input(s): POCBNP CBG: No results for input(s): GLUCAP in the last 168 hours.    Disposition and Follow-up: Discharge Instructions    Diet - low sodium heart healthy   Complete by:  As directed    Increase activity slowly   Complete by:  As directed        DISPOSITION: Home with home health   DISCHARGE FOLLOW-UP Follow-up Information    Reed, Tiffany L, DO. Schedule an appointment as soon as possible for a visit in 2 week(s).   Specialty:  Geriatric Medicine Contact information: [place] [address] [phone]        [facility] Follow up.   Specialty:  [place] Why:  HHRN/PT/OT/aide arranged- they will call you to arrange home visits Contact information: [address] [place] [address] [phone]        [place] Follow up.   Why:  hospital bed arranged- to be delivered to home Contact information: [address] [place] [address] [phone]        Kathie Rhodes, MD. Schedule an appointment as soon as possible for a visit in 1 week(s).   Specialty:  Urology Why:  for voiding trial in office  Contact information: [place] [address] [phone]            Time coordinating discharge:  35-minutes  Signed:   Estill Cotta M.D. [facility] 10/26/2017, 12:21 PM Pager: [phone]

## 2017-10-26 NOTE — Care Management Note (Addendum)
Case Management Note Marvetta Gibbons RN, BSN Unit 4E-Case Manager [phone]  Patient Details  Name: Joe Dawson MRN: [id number] Date of Birth: [birth year]  Subjective/Objective:  Pt admitted with sepsis                  Action/Plan: PTA pt lived at home with wife- has been in a SNF recently however wife decided to take pt home with [facility] services- and private duty assistance. Per PT/OT evals recommend [facility] services to maximize [facility] support as wife does not want SNF again.   Expected Discharge Date:  10/26/17               Expected Discharge Plan:  [place]  In-House Referral:  NA  Discharge planning Services  CM Consult  Post Acute Care Choice:  Durable Medical Equipment, Home Health Choice offered to:  Spouse  DME Arranged:  Hospital bed DME Agency:  [facility] Arranged:  RN, PT, OT, Nurse's Aide [facility] Agency:  [place]  Status of Service:  Completed, signed off  If discussed at [place] of Stay Meetings, dates discussed:    Discharge Disposition: home/home health   Additional Comments:  10/26/17- 1015- Hager [place] RN, CM- noted HH and DME orders- in to speak with wife at bedside- per wife pt has been active with [facility] in past- recently discharge from [facility] services - would like to use them again for services- wife also working with [facility] for in home aide assistance- DME hospital bed has been ordered and wife states she has space made for bed. Noted pt has d/c order for today- wife concerned pt is not ready and she is not ready to take pt home "states she was told it would be Friday be for he would be ready" wants to speak with patients bedside RN and the MD on today. Have let the bedside RN know and she will page MD. Call made to Butch Penny with [facility] for [facility] referral and DME - hospital bed needs- will try to have bed delivered to home today have informed wife she will need to be home for delivery.  Update: 1200- per bedside RN- wife  would like pt to go home per EMS- have confirmed this with wife along with address- pt is DNR will need to have wife bring GOLD DNR form back to hospital for transport- wife states she will go fill scripts and bring form back when she returns.- [place] working on getting bed to home today (per wife she has received call from [facility] that bed will be delivered to home tomorrow between 10-2)- will have Butch Penny with [facility] check on this for expedited delivery today with pending discharge.   Dawayne Patricia, RN 10/26/2017, 10:40 AM

## 2017-10-26 NOTE — Care Management Important Message (Signed)
Important Message  Patient Details  Name: Joe Dawson MRN: [id number] Date of Birth: 30-Jan-1951   Medicare Important Message Given:  Yes    [name] P [place] 10/26/2017, 3:14 PM

## 2017-10-26 NOTE — Progress Notes (Signed)
Received call from Butch Penny with [facility]- they can deliver hospital bed today between 3-7pm main office to reach out to wife with this information. Wife is not at bedside at this time.

## 2017-10-26 NOTE — Progress Notes (Signed)
Patient discharge to home per PTAR .AVS was given to [facility] staff.

## 2017-10-27 ENCOUNTER — Telehealth: Payer: Self-pay

## 2017-10-27 ENCOUNTER — Other Ambulatory Visit: Payer: Self-pay | Admitting: *Deleted

## 2017-10-27 NOTE — Patient Outreach (Signed)
[facility] Dawson [facility]) [place] Telephone Outreach PCP office completes transition of care outreach post-hospital discharge Post-hospital discharge day 1  10/27/2017  Joe Dawson [birth year] [id number]  Successful telephone outreach to Joe Dawson, spouse/ caregiver for Joe Dawson, 67 y/o male referred to [place] on 10/11/17 by insurance plan; patient has had multiple hospitalizations and ED visits.  Patient has history including, but not limited to, bilateral eye legal blindness; bilateral hearing loss; severe dementia with behavioral disturbance; BPH; HTN/ HLD.  Since Joe Dawson CM started working with patient/ caregiver, patient experienced hospital admission June 3-6, 2019 for fever of UKO; patient was diagnosed with sepsis secondary to HCAP and UTI.  Patient was discharged home to self-care with home health services in place through [facility].  HIPAA/ Identity verified with patient's caregiver/ spouse today.    Today, Joe Dawson reports that patient "is doing okay," and states that patient was discharged home "late last night by ambulance."  Denies that patient is in pain/ distress, and states that he "had a fairly good night's rest."  Reports patient is currently sleeping.  Caregiver further reports:  -- has and is taking medications as prescribed post-hospital discharge; today, she denies concerns/ issues around patient's medications; caregiver states she is unable to complete post-hospital discharge medication review, states she has 'all the discharge instructions" and she denies questions  -- Home health [facility]) services in place for PT, OT, RN, and bath aide through [facility]: states she has heard from home health agency this morning and is expecting a visit from the nurse "either later today or tomorrow," stating that the nurse told her she would make a visit within 48 hours of hospital discharge.  Confirms that she has contact  information for [facility] agency; reports DME hosiptal bed arrived at her home yesterday, and is "a great help."  -- confirms that she has spoken with [facility] team, and that their previously scheduled home visit had to be cancelled due to patient's unexpected hospitalization; she is hoping that another visit can be scheduled promptly, and states she is going to contact [facility] team to arrange this "as soon as" our conversation is completed.  Caregiver/ wife remains hopeful that [facility] will become involved in patient's care.  -- confirms that patient continues to require "total care," and assistance with all care and hygiene needs  -- Advanced Directive Planning:  Reports patient has current HCPOA Joe Dawson, wife), a living will, and posted DNR/ MOST in home; declines desire to make changes post-recent hospital discharge  -- Mobility issues continue-- patient is unable to ambulate, but can stand and is only able to pivot from bed to wheelchair; otherwise wheelchair bound  -- private duty care assistants remain involved in patient's care and wife verbalizes plans to "increase their time with patient at least temporarily" while patient recuperates from recent hospitalization  -- provider appointments:  Confirms that she has spoken with patient's PCP team this morning, and she confirms that she has shared with them her issues in getting patient to and from doctor's appointments:  Confirms that she has spoken to PCP about need for [facility] or other agency to become involved in patient's care at home.  Encouraged caregiver to maintain contact with PCP regarding ongoing needs of patient  Patient's caregiver denies further issues, concerns, or problems today. I confirmed that caregiver hasmy direct phone number, the main [facility] CM office phone number, and the [facility] CM  24-hour nurse advice phone number should issues arise prior to next scheduled [place]  outreach, which we agreed would be in 2 weeks.  Encouraged caregiver to contact me or [facility] CM office directly if needs, questions, issues, or concerns arise prior to next scheduled outreach; she agreed to do so.  Plan:  Patient will take medications as prescribed  Caregiver will promptly notify care providers of any concerns/ issues/ problems that arise  [facility] CM outreach to continue with scheduled call in 2 weeks  [facility] CM Care Plan Problem One     Most Recent Value  Care Plan Problem One  Need for patient care assistance and community resources at home in patient with dementia, as evidenced by caregiver reporting  Role Documenting the Problem One  Care Management [place] for Problem One  Active  [facility] Long Term Goal   Over the next 60 days, patient's caregiver will be able to verbalize plan for patient care support at home, as evidenced by caregiver reporting during [place] CM outreach  [place] Term Goal Start Date  10/13/17  Interventions for Problem One Long Term Goal  Discussed with caregiver plan for [facility] referral placed on 10/13/17, and encouraged caregiver to promptly re-engage with palliative care team post-most recent hospital discharge  [facility] CM Short Term Goal #1   Over the next 14 days, patient's caregiver will speak to [facility] CSW regarding need for transportation and in-home care assistance resources and support, as evidenced by caregiver reporting and collaboration with [facility] CSW as indicated  [facility] CM Short Term Goal #1 Start Date  10/13/17  [facility] CM Short Term Goal #1 Met Date  10/27/17 - Goal met  Interventions for Short Term Goal #1  Confirmed that caregiver has spoken with [facility] CSW,  encouraged caregiver to maintain communication with CSW  [facility] CM Short Term Goal #2   Over the 14 days, patient's caregiver will speak to [facility] team to discuss their program, as evidenced by caregiver reporting and  collaboration with [facility] team, as indicated  [facility] CM Short Term Goal #2 Start Date  10/13/17  [facility] CM Short Term Goal #2 Met Date  10/27/17 - Goal met  Interventions for Short Term Goal #2  Confirmed with caregiver that patient has been contacted by [facility] team, with intake visit planned,  encouraged caregiver to maintain communication with palliative care team and to re-schedule previously planned home visit promptly    [facility] CM Care Plan Problem Two     Most Recent Value  Care Plan Problem Two  Risk for hospital re-admission related to/ as evidenced by recent hospital visit June 3-6, 2019 for sepsis secondary to UTI and HCAP  Role Documenting the Problem Two  Care Management Coordinator  Care Plan for Problem Two  Active  Interventions for Problem Two Long Term Goal   Discussed with caregiver patient's current clinical condition, home health services in place for patient, hospital discharge instructions, caregiver support in place for private duty care assistants  [facility] Long Term Goal  Over the next 31 days, patient will not experience hospital readmission, as evidenced by patient caregiver reporting during [place] RN CM outreach  [facility] Long Term Goal Start Date  10/27/17     [name], RN, BSN, [place] Care Management  [phone]

## 2017-10-27 NOTE — Telephone Encounter (Addendum)
Transition Care Management Follow-Up Telephone Call   Date discharged and where: [facility] on 10/26/2017  How have you been since you were released from the hospital? Per patient's wife he has been feeling pretty good, but very tired. He sat up for 2 hours in wheelchair today. He has been in no pain and only having SOB sometimes. He is now using a hospital bed.  Any patient concerns? Per patient's wife she says there is no way she can get him to the doctors office. He is too physically weak. She has looked into other options of having a doctor come to the house but none of them are working.   Items Reviewed:   Meds: On antibiotics for UTI and kidney. Was able to get all his prescriptions refilled  Allergies: Y  Dietary Changes Reviewed: Y  Functional Questionnaire:  Independent-I Dependent-D  ADLs:   Dressing- I w/ assist    Eating- I   Maintaining continence- Currently has 1 week temporary catheter   Transferring- Uses a wheelchair   Transportation- D   Meal Prep- D   Managing Meds- I w/ assist   Confirmed importance and Date/Time of follow-up visits scheduled: As stated above, he is unable to get to the doctors office so a follow up visit was declined   Confirmed with patient if condition worsens to call PCP or go to the Emergency Dept. Patient was given office number and encouraged to call back with questions or concerns: Yes

## 2017-10-28 DIAGNOSIS — E440 Moderate protein-calorie malnutrition: Secondary | ICD-10-CM | POA: Diagnosis not present

## 2017-10-28 DIAGNOSIS — M1611 Unilateral primary osteoarthritis, right hip: Secondary | ICD-10-CM | POA: Diagnosis not present

## 2017-10-28 DIAGNOSIS — G35 Multiple sclerosis: Secondary | ICD-10-CM | POA: Diagnosis not present

## 2017-10-28 DIAGNOSIS — Z9181 History of falling: Secondary | ICD-10-CM | POA: Diagnosis not present

## 2017-10-28 DIAGNOSIS — Z8701 Personal history of pneumonia (recurrent): Secondary | ICD-10-CM | POA: Diagnosis not present

## 2017-10-28 DIAGNOSIS — Z466 Encounter for fitting and adjustment of urinary device: Secondary | ICD-10-CM | POA: Diagnosis not present

## 2017-10-28 DIAGNOSIS — I10 Essential (primary) hypertension: Secondary | ICD-10-CM | POA: Diagnosis not present

## 2017-10-28 DIAGNOSIS — N400 Enlarged prostate without lower urinary tract symptoms: Secondary | ICD-10-CM | POA: Diagnosis not present

## 2017-10-28 DIAGNOSIS — Z87440 Personal history of urinary (tract) infections: Secondary | ICD-10-CM | POA: Diagnosis not present

## 2017-10-28 DIAGNOSIS — F0391 Unspecified dementia with behavioral disturbance: Secondary | ICD-10-CM | POA: Diagnosis not present

## 2017-10-30 ENCOUNTER — Other Ambulatory Visit: Payer: Self-pay

## 2017-10-30 ENCOUNTER — Telehealth: Payer: Self-pay | Admitting: *Deleted

## 2017-10-30 LAB — CULTURE, BLOOD (ROUTINE X 2)
Culture: NO GROWTH
Culture: NO GROWTH
SPECIAL REQUESTS: ADEQUATE
SPECIAL REQUESTS: ADEQUATE

## 2017-10-30 NOTE — Patient Outreach (Signed)
[facility]) Care Management  10/30/2017  Joe Dawson 12-18-1950 [id number]  Outreach call to patients wife, [name], in efforts to follow-up on patients status post hospital discharge. HIPAA identifiers verified. [name] confirmed delivery and use of hospital bed. [name] was successful in arranging a home visit with Manus Gunning from [facility]. [name] stated she is not currently home but believes this appointment was scheduled for Wednesday (6/12).   [name] has increased private duty hours from 4 hours two days a week to 4 hours 3 days a week. [name] stated patient is currently home with private duty sitter who is giving him a bed bath. The caregiver suggested bed baths to [facility] due to safety concerns with transfers based on patient being "weak" from not eating. Patient received a visit today from the registered nurse with the home care agency to update patients plan of care.  BSW inquired about patients appetite. [name] stated patient is "sleeping a lot, not eating much, but still able to drink". [name] also stated "I've heard when they have dementia and stop eating it is a bad sign but he is still eating some". BSW asked what patient was able to eat. [name] informed this BSW that patients intake yesterday consisted of "two bites of egg, two ensures, coffee, juice, two bites of mashed cauliflower, and one bite of chicken". [name] mentioned patient having a lot of difficulty in chewing meat and not being sure why. BSW spoke with [name] about how much energy it takes to chew a bite of meat like chicken. BSW suggested she try to moisten the meat with gravy or cut into smaller pieces. BSW also suggested offering smoothies to patient since he his drinking better than eating. [name] confirmed patient used to love smoothies. [name] is currently out running errands and plans to purchase pre-made smoothies to offer patient.   Although patient is not eating well [name] confirms he is able to swallow medications and is taking  as prescribed. BSW spoke with [name] about lack of programs to assist with in-home personal care services unless she is wanting to private pay. [name] has already increased hours since hospital discharge and will add more if needed. Patient no longer needing transportation services through [place] Management due to several factors including being bed bound due to weakness as well as planned intake through [facility] program for in-home services.   BSW to perform a discipline closure on today's date. BSW will update patients RN CM of social work closure. BSW spoke with [name] about how to contact this BSW or the [place] Management office if future needs arise. Patient is scheduled for a follow-up call with RN CM, Reginia Naas in 1 week. [name] confirmed understanding of reason for closure and is knowledgeable of how to contact [place] Management for any future patient needs.  Daneen Schick, Arita Miss, CDP Social Worker [phone]

## 2017-10-30 NOTE — Telephone Encounter (Signed)
Erline Levine with [facility] called requesting verbal orders for PT twice weekly. Verbal orders given.

## 2017-10-31 ENCOUNTER — Ambulatory Visit: Payer: Self-pay

## 2017-11-02 ENCOUNTER — Telehealth: Payer: Self-pay | Admitting: *Deleted

## 2017-11-02 NOTE — Telephone Encounter (Signed)
Joe Dawson with [facility] called requesting verbal orders for Home Health 1X1wk, 2X2wk, and 2prn. Verbal order given.

## 2017-11-02 NOTE — Telephone Encounter (Signed)
Joe Dawson with [facility] calling asking for verbal order for OT therapy for 2 times weekly Verbal order given

## 2017-11-06 ENCOUNTER — Other Ambulatory Visit: Payer: Self-pay | Admitting: *Deleted

## 2017-11-06 NOTE — Patient Outreach (Addendum)
[facility]) Care Management [facility] CM Telephone Outreach Case Closure [place] Telephone Outreach Care Coordination   11/06/2017  Joe Dawson 07/09/1950 [id number]  Received secure voice mail message from [name], RN with [facility] [place], re: [name], 67 y/o male referred to [facility] on 10/11/17 by insurance plan; patient has had multiple hospitalizations and ED visits.Patient has history including, but not limited to, bilateral eye legal blindness; bilateral hearing loss; severe dementia with behavioral disturbance; BPH; HTN/ HLD.   In her voice mail message, [name] confirmed that patient has now been enrolled in [name] program.  09:15:  Successful telephone outreach to Joe Dawson, spouse/ caregiver of patient. HIPAA/ Identity verified with patient's caregiver/ spouse today.   Today, [name] confirmed that patient has been enrolled with [facility] program, and she verbalizes understanding that [place] program will now be closed.  [name] denies questions and concerns and verbalizes great relief that [facility] Program is now active with patient.  Emotional support/ encouragement provided.  Plan:  Will close [place] case, as patient is now actively enrolled in [place] program, and make patient's PCP aware of same  [name], RN, BSN, [place] Coordinator [facility] Care Management  [phone]

## 2017-11-07 ENCOUNTER — Ambulatory Visit: Payer: Self-pay | Admitting: *Deleted

## 2017-11-22 ENCOUNTER — Other Ambulatory Visit: Payer: Self-pay | Admitting: Internal Medicine

## 2017-11-25 DIAGNOSIS — A419 Sepsis, unspecified organism: Secondary | ICD-10-CM | POA: Diagnosis not present

## 2017-11-30 ENCOUNTER — Encounter: Payer: Self-pay | Admitting: Internal Medicine

## 2017-11-30 DIAGNOSIS — I10 Essential (primary) hypertension: Secondary | ICD-10-CM | POA: Diagnosis not present

## 2017-11-30 DIAGNOSIS — G35 Multiple sclerosis: Secondary | ICD-10-CM | POA: Diagnosis not present

## 2017-11-30 DIAGNOSIS — R300 Dysuria: Secondary | ICD-10-CM | POA: Diagnosis not present

## 2017-11-30 DIAGNOSIS — E440 Moderate protein-calorie malnutrition: Secondary | ICD-10-CM | POA: Diagnosis not present

## 2017-12-05 ENCOUNTER — Telehealth: Payer: Self-pay

## 2017-12-05 NOTE — Telephone Encounter (Signed)
We've called her several times about this and it's been impossible to get him in here to be seen.

## 2017-12-05 NOTE — Telephone Encounter (Signed)
I called patient/patient's wife to schedule an appointment, for patient was seen greater than 1 year ago.  Per [name] patient is bed written and will probably never be able to come into office. I asked Mrs.Fix if she has thought about [employer] and she stated her husband is not lacking care from doctors for he has seen many of doctors within the last year from ER visits and being in a facility for a few weeks.   Patient is receiving care from [facility] that is associated with Hospice Palliative Care, yet its premature to consider Hospice.   I informed [name] that I will forward to Dr.Reed as a [place]

## 2017-12-06 NOTE — Telephone Encounter (Signed)
Is it ok for the clinical staff to continue to give verbal orders since patient last seen greater than 1 year ago?

## 2017-12-06 NOTE — Telephone Encounter (Signed)
Please send me the orders so I am kept in the loop because that information and notes from the palliative service he's receiving are the only information I am getting at this time.  I wish there was a better way, but Joe Dawson would like for Joe Dawson to remain under my care.

## 2017-12-21 ENCOUNTER — Telehealth: Payer: Self-pay | Admitting: *Deleted

## 2017-12-21 NOTE — Telephone Encounter (Signed)
Harley Alto, RN with [facility] called with patient update: He was recently dx with UTI and was treated with Bactrim DS BID x10 days which helped. He now has a foley, so they have DC'd his tamsulosin. Her call back is [phone] if you have any questions.

## 2017-12-21 NOTE — Telephone Encounter (Signed)
Sounds appropriate. I appreciate the update.

## 2017-12-25 ENCOUNTER — Telehealth: Payer: Self-pay | Admitting: Internal Medicine

## 2017-12-25 DIAGNOSIS — F339 Major depressive disorder, recurrent, unspecified: Secondary | ICD-10-CM

## 2017-12-25 MED ORDER — CITALOPRAM HYDROBROMIDE 10 MG PO TABS: 10.0000 mg | ORAL_TABLET | Freq: Every day | ORAL | 5 refills | Status: AC

## 2017-12-25 NOTE — Telephone Encounter (Signed)
Pt's wife requested refill of celexa--provided.

## 2017-12-26 DIAGNOSIS — A419 Sepsis, unspecified organism: Secondary | ICD-10-CM | POA: Diagnosis not present

## 2018-01-26 DIAGNOSIS — A419 Sepsis, unspecified organism: Secondary | ICD-10-CM | POA: Diagnosis not present

## 2018-02-01 ENCOUNTER — Encounter: Payer: Self-pay | Admitting: Internal Medicine

## 2018-02-01 DIAGNOSIS — N390 Urinary tract infection, site not specified: Secondary | ICD-10-CM | POA: Diagnosis not present

## 2018-02-01 DIAGNOSIS — R109 Unspecified abdominal pain: Secondary | ICD-10-CM | POA: Diagnosis not present

## 2018-02-20 ENCOUNTER — Other Ambulatory Visit: Payer: Self-pay | Admitting: *Deleted

## 2018-02-20 MED ORDER — LISINOPRIL 20 MG PO TABS: 20.0000 mg | ORAL_TABLET | Freq: Every day | ORAL | 1 refills | Status: AC

## 2018-02-20 NOTE — Telephone Encounter (Signed)
[facility] 

## 2018-02-25 DIAGNOSIS — A419 Sepsis, unspecified organism: Secondary | ICD-10-CM | POA: Diagnosis not present

## 2018-03-02 ENCOUNTER — Telehealth: Payer: Self-pay | Admitting: *Deleted

## 2018-03-02 NOTE — Telephone Encounter (Signed)
Spoke with Joe Dawson and gave verbal order for CMP, CBC.

## 2018-03-02 NOTE — Telephone Encounter (Signed)
Almyra Free with [facility] called requesting an order to draw CMP and CBC on patient on 03/13/18 when she goes out there. Nurse stated that patient is requesting to check his levels and to make sure he is not dehydrated. Please Advise.

## 2018-03-05 ENCOUNTER — Telehealth: Payer: Self-pay | Admitting: *Deleted

## 2018-03-05 NOTE — Telephone Encounter (Signed)
Manus Gunning with Hospice called requesting a verbal order for Hospice. Stated that the wife was ready to transition. Wife was in for a flu shot this morning and confirmed.  Verbal order given.

## 2018-03-06 DIAGNOSIS — Z87440 Personal history of urinary (tract) infections: Secondary | ICD-10-CM | POA: Diagnosis not present

## 2018-03-06 DIAGNOSIS — F17210 Nicotine dependence, cigarettes, uncomplicated: Secondary | ICD-10-CM | POA: Diagnosis not present

## 2018-03-06 DIAGNOSIS — I714 Abdominal aortic aneurysm, without rupture: Secondary | ICD-10-CM | POA: Diagnosis not present

## 2018-03-06 DIAGNOSIS — G35 Multiple sclerosis: Secondary | ICD-10-CM | POA: Diagnosis not present

## 2018-03-06 DIAGNOSIS — F0281 Dementia in other diseases classified elsewhere with behavioral disturbance: Secondary | ICD-10-CM | POA: Diagnosis not present

## 2018-03-06 DIAGNOSIS — H548 Legal blindness, as defined in USA: Secondary | ICD-10-CM | POA: Diagnosis not present

## 2018-03-06 DIAGNOSIS — Z466 Encounter for fitting and adjustment of urinary device: Secondary | ICD-10-CM | POA: Diagnosis not present

## 2018-03-06 DIAGNOSIS — E43 Unspecified severe protein-calorie malnutrition: Secondary | ICD-10-CM | POA: Diagnosis not present

## 2018-03-06 DIAGNOSIS — I10 Essential (primary) hypertension: Secondary | ICD-10-CM | POA: Diagnosis not present

## 2018-03-06 DIAGNOSIS — Z8701 Personal history of pneumonia (recurrent): Secondary | ICD-10-CM | POA: Diagnosis not present

## 2018-03-06 DIAGNOSIS — R1310 Dysphagia, unspecified: Secondary | ICD-10-CM | POA: Diagnosis not present

## 2018-03-06 DIAGNOSIS — G3109 Other frontotemporal dementia: Secondary | ICD-10-CM | POA: Diagnosis not present

## 2018-03-07 DIAGNOSIS — G3109 Other frontotemporal dementia: Secondary | ICD-10-CM | POA: Diagnosis not present

## 2018-03-07 DIAGNOSIS — G35 Multiple sclerosis: Secondary | ICD-10-CM | POA: Diagnosis not present

## 2018-03-07 DIAGNOSIS — Z8701 Personal history of pneumonia (recurrent): Secondary | ICD-10-CM | POA: Diagnosis not present

## 2018-03-07 DIAGNOSIS — F0281 Dementia in other diseases classified elsewhere with behavioral disturbance: Secondary | ICD-10-CM | POA: Diagnosis not present

## 2018-03-07 DIAGNOSIS — E43 Unspecified severe protein-calorie malnutrition: Secondary | ICD-10-CM | POA: Diagnosis not present

## 2018-03-07 DIAGNOSIS — R1310 Dysphagia, unspecified: Secondary | ICD-10-CM | POA: Diagnosis not present

## 2018-03-09 DIAGNOSIS — R1310 Dysphagia, unspecified: Secondary | ICD-10-CM | POA: Diagnosis not present

## 2018-03-09 DIAGNOSIS — E43 Unspecified severe protein-calorie malnutrition: Secondary | ICD-10-CM | POA: Diagnosis not present

## 2018-03-09 DIAGNOSIS — G35 Multiple sclerosis: Secondary | ICD-10-CM | POA: Diagnosis not present

## 2018-03-09 DIAGNOSIS — F0281 Dementia in other diseases classified elsewhere with behavioral disturbance: Secondary | ICD-10-CM | POA: Diagnosis not present

## 2018-03-09 DIAGNOSIS — G3109 Other frontotemporal dementia: Secondary | ICD-10-CM | POA: Diagnosis not present

## 2018-03-09 DIAGNOSIS — Z8701 Personal history of pneumonia (recurrent): Secondary | ICD-10-CM | POA: Diagnosis not present

## 2018-03-12 DIAGNOSIS — E43 Unspecified severe protein-calorie malnutrition: Secondary | ICD-10-CM | POA: Diagnosis not present

## 2018-03-12 DIAGNOSIS — G35 Multiple sclerosis: Secondary | ICD-10-CM | POA: Diagnosis not present

## 2018-03-12 DIAGNOSIS — G3109 Other frontotemporal dementia: Secondary | ICD-10-CM | POA: Diagnosis not present

## 2018-03-12 DIAGNOSIS — F0281 Dementia in other diseases classified elsewhere with behavioral disturbance: Secondary | ICD-10-CM | POA: Diagnosis not present

## 2018-03-12 DIAGNOSIS — Z8701 Personal history of pneumonia (recurrent): Secondary | ICD-10-CM | POA: Diagnosis not present

## 2018-03-12 DIAGNOSIS — R1310 Dysphagia, unspecified: Secondary | ICD-10-CM | POA: Diagnosis not present

## 2018-03-13 DIAGNOSIS — R1310 Dysphagia, unspecified: Secondary | ICD-10-CM | POA: Diagnosis not present

## 2018-03-13 DIAGNOSIS — Z8701 Personal history of pneumonia (recurrent): Secondary | ICD-10-CM | POA: Diagnosis not present

## 2018-03-13 DIAGNOSIS — G35 Multiple sclerosis: Secondary | ICD-10-CM | POA: Diagnosis not present

## 2018-03-13 DIAGNOSIS — E43 Unspecified severe protein-calorie malnutrition: Secondary | ICD-10-CM | POA: Diagnosis not present

## 2018-03-13 DIAGNOSIS — G3109 Other frontotemporal dementia: Secondary | ICD-10-CM | POA: Diagnosis not present

## 2018-03-13 DIAGNOSIS — F0281 Dementia in other diseases classified elsewhere with behavioral disturbance: Secondary | ICD-10-CM | POA: Diagnosis not present

## 2018-03-14 DIAGNOSIS — G35 Multiple sclerosis: Secondary | ICD-10-CM | POA: Diagnosis not present

## 2018-03-14 DIAGNOSIS — G3109 Other frontotemporal dementia: Secondary | ICD-10-CM | POA: Diagnosis not present

## 2018-03-14 DIAGNOSIS — R1310 Dysphagia, unspecified: Secondary | ICD-10-CM | POA: Diagnosis not present

## 2018-03-14 DIAGNOSIS — F0281 Dementia in other diseases classified elsewhere with behavioral disturbance: Secondary | ICD-10-CM | POA: Diagnosis not present

## 2018-03-14 DIAGNOSIS — Z8701 Personal history of pneumonia (recurrent): Secondary | ICD-10-CM | POA: Diagnosis not present

## 2018-03-14 DIAGNOSIS — E43 Unspecified severe protein-calorie malnutrition: Secondary | ICD-10-CM | POA: Diagnosis not present

## 2018-03-16 DIAGNOSIS — Z8701 Personal history of pneumonia (recurrent): Secondary | ICD-10-CM | POA: Diagnosis not present

## 2018-03-16 DIAGNOSIS — R1310 Dysphagia, unspecified: Secondary | ICD-10-CM | POA: Diagnosis not present

## 2018-03-16 DIAGNOSIS — E43 Unspecified severe protein-calorie malnutrition: Secondary | ICD-10-CM | POA: Diagnosis not present

## 2018-03-16 DIAGNOSIS — F0281 Dementia in other diseases classified elsewhere with behavioral disturbance: Secondary | ICD-10-CM | POA: Diagnosis not present

## 2018-03-16 DIAGNOSIS — G35 Multiple sclerosis: Secondary | ICD-10-CM | POA: Diagnosis not present

## 2018-03-16 DIAGNOSIS — G3109 Other frontotemporal dementia: Secondary | ICD-10-CM | POA: Diagnosis not present

## 2018-03-19 DIAGNOSIS — F0281 Dementia in other diseases classified elsewhere with behavioral disturbance: Secondary | ICD-10-CM | POA: Diagnosis not present

## 2018-03-19 DIAGNOSIS — E43 Unspecified severe protein-calorie malnutrition: Secondary | ICD-10-CM | POA: Diagnosis not present

## 2018-03-19 DIAGNOSIS — G35 Multiple sclerosis: Secondary | ICD-10-CM | POA: Diagnosis not present

## 2018-03-19 DIAGNOSIS — Z8701 Personal history of pneumonia (recurrent): Secondary | ICD-10-CM | POA: Diagnosis not present

## 2018-03-19 DIAGNOSIS — G3109 Other frontotemporal dementia: Secondary | ICD-10-CM | POA: Diagnosis not present

## 2018-03-19 DIAGNOSIS — R1310 Dysphagia, unspecified: Secondary | ICD-10-CM | POA: Diagnosis not present

## 2018-03-20 DIAGNOSIS — G35 Multiple sclerosis: Secondary | ICD-10-CM | POA: Diagnosis not present

## 2018-03-20 DIAGNOSIS — G3109 Other frontotemporal dementia: Secondary | ICD-10-CM | POA: Diagnosis not present

## 2018-03-20 DIAGNOSIS — R1310 Dysphagia, unspecified: Secondary | ICD-10-CM | POA: Diagnosis not present

## 2018-03-20 DIAGNOSIS — E43 Unspecified severe protein-calorie malnutrition: Secondary | ICD-10-CM | POA: Diagnosis not present

## 2018-03-20 DIAGNOSIS — Z8701 Personal history of pneumonia (recurrent): Secondary | ICD-10-CM | POA: Diagnosis not present

## 2018-03-20 DIAGNOSIS — F0281 Dementia in other diseases classified elsewhere with behavioral disturbance: Secondary | ICD-10-CM | POA: Diagnosis not present

## 2018-03-21 DIAGNOSIS — Z8701 Personal history of pneumonia (recurrent): Secondary | ICD-10-CM | POA: Diagnosis not present

## 2018-03-21 DIAGNOSIS — F0281 Dementia in other diseases classified elsewhere with behavioral disturbance: Secondary | ICD-10-CM | POA: Diagnosis not present

## 2018-03-21 DIAGNOSIS — G35 Multiple sclerosis: Secondary | ICD-10-CM | POA: Diagnosis not present

## 2018-03-21 DIAGNOSIS — G3109 Other frontotemporal dementia: Secondary | ICD-10-CM | POA: Diagnosis not present

## 2018-03-21 DIAGNOSIS — R1310 Dysphagia, unspecified: Secondary | ICD-10-CM | POA: Diagnosis not present

## 2018-03-21 DIAGNOSIS — E43 Unspecified severe protein-calorie malnutrition: Secondary | ICD-10-CM | POA: Diagnosis not present

## 2018-03-23 DIAGNOSIS — F0281 Dementia in other diseases classified elsewhere with behavioral disturbance: Secondary | ICD-10-CM | POA: Diagnosis not present

## 2018-03-23 DIAGNOSIS — I714 Abdominal aortic aneurysm, without rupture: Secondary | ICD-10-CM | POA: Diagnosis not present

## 2018-03-23 DIAGNOSIS — Z8701 Personal history of pneumonia (recurrent): Secondary | ICD-10-CM | POA: Diagnosis not present

## 2018-03-23 DIAGNOSIS — I10 Essential (primary) hypertension: Secondary | ICD-10-CM | POA: Diagnosis not present

## 2018-03-23 DIAGNOSIS — F17210 Nicotine dependence, cigarettes, uncomplicated: Secondary | ICD-10-CM | POA: Diagnosis not present

## 2018-03-23 DIAGNOSIS — G3109 Other frontotemporal dementia: Secondary | ICD-10-CM | POA: Diagnosis not present

## 2018-03-23 DIAGNOSIS — Z87440 Personal history of urinary (tract) infections: Secondary | ICD-10-CM | POA: Diagnosis not present

## 2018-03-23 DIAGNOSIS — G35 Multiple sclerosis: Secondary | ICD-10-CM | POA: Diagnosis not present

## 2018-03-23 DIAGNOSIS — H548 Legal blindness, as defined in USA: Secondary | ICD-10-CM | POA: Diagnosis not present

## 2018-03-23 DIAGNOSIS — Z466 Encounter for fitting and adjustment of urinary device: Secondary | ICD-10-CM | POA: Diagnosis not present

## 2018-03-23 DIAGNOSIS — E43 Unspecified severe protein-calorie malnutrition: Secondary | ICD-10-CM | POA: Diagnosis not present

## 2018-03-23 DIAGNOSIS — R1310 Dysphagia, unspecified: Secondary | ICD-10-CM | POA: Diagnosis not present

## 2018-03-26 DIAGNOSIS — G35 Multiple sclerosis: Secondary | ICD-10-CM | POA: Diagnosis not present

## 2018-03-26 DIAGNOSIS — E43 Unspecified severe protein-calorie malnutrition: Secondary | ICD-10-CM | POA: Diagnosis not present

## 2018-03-26 DIAGNOSIS — G3109 Other frontotemporal dementia: Secondary | ICD-10-CM | POA: Diagnosis not present

## 2018-03-26 DIAGNOSIS — Z8701 Personal history of pneumonia (recurrent): Secondary | ICD-10-CM | POA: Diagnosis not present

## 2018-03-26 DIAGNOSIS — R1310 Dysphagia, unspecified: Secondary | ICD-10-CM | POA: Diagnosis not present

## 2018-03-26 DIAGNOSIS — F0281 Dementia in other diseases classified elsewhere with behavioral disturbance: Secondary | ICD-10-CM | POA: Diagnosis not present

## 2018-03-27 DIAGNOSIS — R1310 Dysphagia, unspecified: Secondary | ICD-10-CM | POA: Diagnosis not present

## 2018-03-27 DIAGNOSIS — G3109 Other frontotemporal dementia: Secondary | ICD-10-CM | POA: Diagnosis not present

## 2018-03-27 DIAGNOSIS — G35 Multiple sclerosis: Secondary | ICD-10-CM | POA: Diagnosis not present

## 2018-03-27 DIAGNOSIS — Z8701 Personal history of pneumonia (recurrent): Secondary | ICD-10-CM | POA: Diagnosis not present

## 2018-03-27 DIAGNOSIS — F0281 Dementia in other diseases classified elsewhere with behavioral disturbance: Secondary | ICD-10-CM | POA: Diagnosis not present

## 2018-03-27 DIAGNOSIS — E43 Unspecified severe protein-calorie malnutrition: Secondary | ICD-10-CM | POA: Diagnosis not present

## 2018-03-28 DIAGNOSIS — E43 Unspecified severe protein-calorie malnutrition: Secondary | ICD-10-CM | POA: Diagnosis not present

## 2018-03-28 DIAGNOSIS — A419 Sepsis, unspecified organism: Secondary | ICD-10-CM | POA: Diagnosis not present

## 2018-03-28 DIAGNOSIS — Z8701 Personal history of pneumonia (recurrent): Secondary | ICD-10-CM | POA: Diagnosis not present

## 2018-03-28 DIAGNOSIS — F0281 Dementia in other diseases classified elsewhere with behavioral disturbance: Secondary | ICD-10-CM | POA: Diagnosis not present

## 2018-03-28 DIAGNOSIS — G35 Multiple sclerosis: Secondary | ICD-10-CM | POA: Diagnosis not present

## 2018-03-28 DIAGNOSIS — G3109 Other frontotemporal dementia: Secondary | ICD-10-CM | POA: Diagnosis not present

## 2018-03-28 DIAGNOSIS — R1310 Dysphagia, unspecified: Secondary | ICD-10-CM | POA: Diagnosis not present

## 2018-03-30 DIAGNOSIS — G35 Multiple sclerosis: Secondary | ICD-10-CM | POA: Diagnosis not present

## 2018-03-30 DIAGNOSIS — G3109 Other frontotemporal dementia: Secondary | ICD-10-CM | POA: Diagnosis not present

## 2018-03-30 DIAGNOSIS — F0281 Dementia in other diseases classified elsewhere with behavioral disturbance: Secondary | ICD-10-CM | POA: Diagnosis not present

## 2018-03-30 DIAGNOSIS — R1310 Dysphagia, unspecified: Secondary | ICD-10-CM | POA: Diagnosis not present

## 2018-03-30 DIAGNOSIS — Z8701 Personal history of pneumonia (recurrent): Secondary | ICD-10-CM | POA: Diagnosis not present

## 2018-03-30 DIAGNOSIS — E43 Unspecified severe protein-calorie malnutrition: Secondary | ICD-10-CM | POA: Diagnosis not present

## 2018-04-02 DIAGNOSIS — G3109 Other frontotemporal dementia: Secondary | ICD-10-CM | POA: Diagnosis not present

## 2018-04-02 DIAGNOSIS — R1310 Dysphagia, unspecified: Secondary | ICD-10-CM | POA: Diagnosis not present

## 2018-04-02 DIAGNOSIS — E43 Unspecified severe protein-calorie malnutrition: Secondary | ICD-10-CM | POA: Diagnosis not present

## 2018-04-02 DIAGNOSIS — Z8701 Personal history of pneumonia (recurrent): Secondary | ICD-10-CM | POA: Diagnosis not present

## 2018-04-02 DIAGNOSIS — G35 Multiple sclerosis: Secondary | ICD-10-CM | POA: Diagnosis not present

## 2018-04-02 DIAGNOSIS — F0281 Dementia in other diseases classified elsewhere with behavioral disturbance: Secondary | ICD-10-CM | POA: Diagnosis not present

## 2018-04-03 DIAGNOSIS — G35 Multiple sclerosis: Secondary | ICD-10-CM | POA: Diagnosis not present

## 2018-04-03 DIAGNOSIS — G3109 Other frontotemporal dementia: Secondary | ICD-10-CM | POA: Diagnosis not present

## 2018-04-03 DIAGNOSIS — R1310 Dysphagia, unspecified: Secondary | ICD-10-CM | POA: Diagnosis not present

## 2018-04-03 DIAGNOSIS — E43 Unspecified severe protein-calorie malnutrition: Secondary | ICD-10-CM | POA: Diagnosis not present

## 2018-04-03 DIAGNOSIS — F0281 Dementia in other diseases classified elsewhere with behavioral disturbance: Secondary | ICD-10-CM | POA: Diagnosis not present

## 2018-04-03 DIAGNOSIS — Z8701 Personal history of pneumonia (recurrent): Secondary | ICD-10-CM | POA: Diagnosis not present

## 2018-04-04 DIAGNOSIS — F0281 Dementia in other diseases classified elsewhere with behavioral disturbance: Secondary | ICD-10-CM | POA: Diagnosis not present

## 2018-04-04 DIAGNOSIS — Z8701 Personal history of pneumonia (recurrent): Secondary | ICD-10-CM | POA: Diagnosis not present

## 2018-04-04 DIAGNOSIS — E43 Unspecified severe protein-calorie malnutrition: Secondary | ICD-10-CM | POA: Diagnosis not present

## 2018-04-04 DIAGNOSIS — G3109 Other frontotemporal dementia: Secondary | ICD-10-CM | POA: Diagnosis not present

## 2018-04-04 DIAGNOSIS — G35 Multiple sclerosis: Secondary | ICD-10-CM | POA: Diagnosis not present

## 2018-04-04 DIAGNOSIS — R1310 Dysphagia, unspecified: Secondary | ICD-10-CM | POA: Diagnosis not present

## 2018-04-06 DIAGNOSIS — F0281 Dementia in other diseases classified elsewhere with behavioral disturbance: Secondary | ICD-10-CM | POA: Diagnosis not present

## 2018-04-06 DIAGNOSIS — G3109 Other frontotemporal dementia: Secondary | ICD-10-CM | POA: Diagnosis not present

## 2018-04-06 DIAGNOSIS — Z8701 Personal history of pneumonia (recurrent): Secondary | ICD-10-CM | POA: Diagnosis not present

## 2018-04-06 DIAGNOSIS — G35 Multiple sclerosis: Secondary | ICD-10-CM | POA: Diagnosis not present

## 2018-04-06 DIAGNOSIS — E43 Unspecified severe protein-calorie malnutrition: Secondary | ICD-10-CM | POA: Diagnosis not present

## 2018-04-06 DIAGNOSIS — R1310 Dysphagia, unspecified: Secondary | ICD-10-CM | POA: Diagnosis not present

## 2018-04-09 DIAGNOSIS — R1310 Dysphagia, unspecified: Secondary | ICD-10-CM | POA: Diagnosis not present

## 2018-04-09 DIAGNOSIS — G3109 Other frontotemporal dementia: Secondary | ICD-10-CM | POA: Diagnosis not present

## 2018-04-09 DIAGNOSIS — F0281 Dementia in other diseases classified elsewhere with behavioral disturbance: Secondary | ICD-10-CM | POA: Diagnosis not present

## 2018-04-09 DIAGNOSIS — Z8701 Personal history of pneumonia (recurrent): Secondary | ICD-10-CM | POA: Diagnosis not present

## 2018-04-09 DIAGNOSIS — G35 Multiple sclerosis: Secondary | ICD-10-CM | POA: Diagnosis not present

## 2018-04-09 DIAGNOSIS — E43 Unspecified severe protein-calorie malnutrition: Secondary | ICD-10-CM | POA: Diagnosis not present

## 2018-04-11 DIAGNOSIS — F0281 Dementia in other diseases classified elsewhere with behavioral disturbance: Secondary | ICD-10-CM | POA: Diagnosis not present

## 2018-04-11 DIAGNOSIS — G35 Multiple sclerosis: Secondary | ICD-10-CM | POA: Diagnosis not present

## 2018-04-11 DIAGNOSIS — Z8701 Personal history of pneumonia (recurrent): Secondary | ICD-10-CM | POA: Diagnosis not present

## 2018-04-11 DIAGNOSIS — R1310 Dysphagia, unspecified: Secondary | ICD-10-CM | POA: Diagnosis not present

## 2018-04-11 DIAGNOSIS — G3109 Other frontotemporal dementia: Secondary | ICD-10-CM | POA: Diagnosis not present

## 2018-04-11 DIAGNOSIS — E43 Unspecified severe protein-calorie malnutrition: Secondary | ICD-10-CM | POA: Diagnosis not present

## 2018-04-13 DIAGNOSIS — E43 Unspecified severe protein-calorie malnutrition: Secondary | ICD-10-CM | POA: Diagnosis not present

## 2018-04-13 DIAGNOSIS — G35 Multiple sclerosis: Secondary | ICD-10-CM | POA: Diagnosis not present

## 2018-04-13 DIAGNOSIS — Z8701 Personal history of pneumonia (recurrent): Secondary | ICD-10-CM | POA: Diagnosis not present

## 2018-04-13 DIAGNOSIS — G3109 Other frontotemporal dementia: Secondary | ICD-10-CM | POA: Diagnosis not present

## 2018-04-13 DIAGNOSIS — F0281 Dementia in other diseases classified elsewhere with behavioral disturbance: Secondary | ICD-10-CM | POA: Diagnosis not present

## 2018-04-13 DIAGNOSIS — R1310 Dysphagia, unspecified: Secondary | ICD-10-CM | POA: Diagnosis not present

## 2018-04-16 DIAGNOSIS — G35 Multiple sclerosis: Secondary | ICD-10-CM | POA: Diagnosis not present

## 2018-04-16 DIAGNOSIS — G3109 Other frontotemporal dementia: Secondary | ICD-10-CM | POA: Diagnosis not present

## 2018-04-16 DIAGNOSIS — F0281 Dementia in other diseases classified elsewhere with behavioral disturbance: Secondary | ICD-10-CM | POA: Diagnosis not present

## 2018-04-16 DIAGNOSIS — R1310 Dysphagia, unspecified: Secondary | ICD-10-CM | POA: Diagnosis not present

## 2018-04-16 DIAGNOSIS — E43 Unspecified severe protein-calorie malnutrition: Secondary | ICD-10-CM | POA: Diagnosis not present

## 2018-04-16 DIAGNOSIS — Z8701 Personal history of pneumonia (recurrent): Secondary | ICD-10-CM | POA: Diagnosis not present

## 2018-04-18 DIAGNOSIS — F0281 Dementia in other diseases classified elsewhere with behavioral disturbance: Secondary | ICD-10-CM | POA: Diagnosis not present

## 2018-04-18 DIAGNOSIS — R1310 Dysphagia, unspecified: Secondary | ICD-10-CM | POA: Diagnosis not present

## 2018-04-18 DIAGNOSIS — G3109 Other frontotemporal dementia: Secondary | ICD-10-CM | POA: Diagnosis not present

## 2018-04-18 DIAGNOSIS — E43 Unspecified severe protein-calorie malnutrition: Secondary | ICD-10-CM | POA: Diagnosis not present

## 2018-04-18 DIAGNOSIS — G35 Multiple sclerosis: Secondary | ICD-10-CM | POA: Diagnosis not present

## 2018-04-18 DIAGNOSIS — Z8701 Personal history of pneumonia (recurrent): Secondary | ICD-10-CM | POA: Diagnosis not present

## 2018-04-20 DIAGNOSIS — G3109 Other frontotemporal dementia: Secondary | ICD-10-CM | POA: Diagnosis not present

## 2018-04-20 DIAGNOSIS — R1310 Dysphagia, unspecified: Secondary | ICD-10-CM | POA: Diagnosis not present

## 2018-04-20 DIAGNOSIS — G35 Multiple sclerosis: Secondary | ICD-10-CM | POA: Diagnosis not present

## 2018-04-20 DIAGNOSIS — E43 Unspecified severe protein-calorie malnutrition: Secondary | ICD-10-CM | POA: Diagnosis not present

## 2018-04-20 DIAGNOSIS — F0281 Dementia in other diseases classified elsewhere with behavioral disturbance: Secondary | ICD-10-CM | POA: Diagnosis not present

## 2018-04-20 DIAGNOSIS — Z8701 Personal history of pneumonia (recurrent): Secondary | ICD-10-CM | POA: Diagnosis not present

## 2018-04-27 DIAGNOSIS — A419 Sepsis, unspecified organism: Secondary | ICD-10-CM | POA: Diagnosis not present

## 2018-05-28 DIAGNOSIS — A419 Sepsis, unspecified organism: Secondary | ICD-10-CM | POA: Diagnosis not present

## 2018-06-28 DIAGNOSIS — A419 Sepsis, unspecified organism: Secondary | ICD-10-CM | POA: Diagnosis not present

## 2018-07-03 ENCOUNTER — Encounter: Payer: Self-pay | Admitting: Gastroenterology

## 2018-07-21 ENCOUNTER — Encounter: Payer: Self-pay | Admitting: Gastroenterology

## 2018-07-22 DEATH — deceased

## 2020-05-10 IMAGING — DX DG CHEST 1V PORT
1 series · 1 of 1 positions shown · non-contrast
Comparison: Chest radiograph performed 08/16/2017

CLINICAL DATA: Acute onset of fever. Dark urine.

EXAM:
PORTABLE CHEST 1 VIEW

[chest]
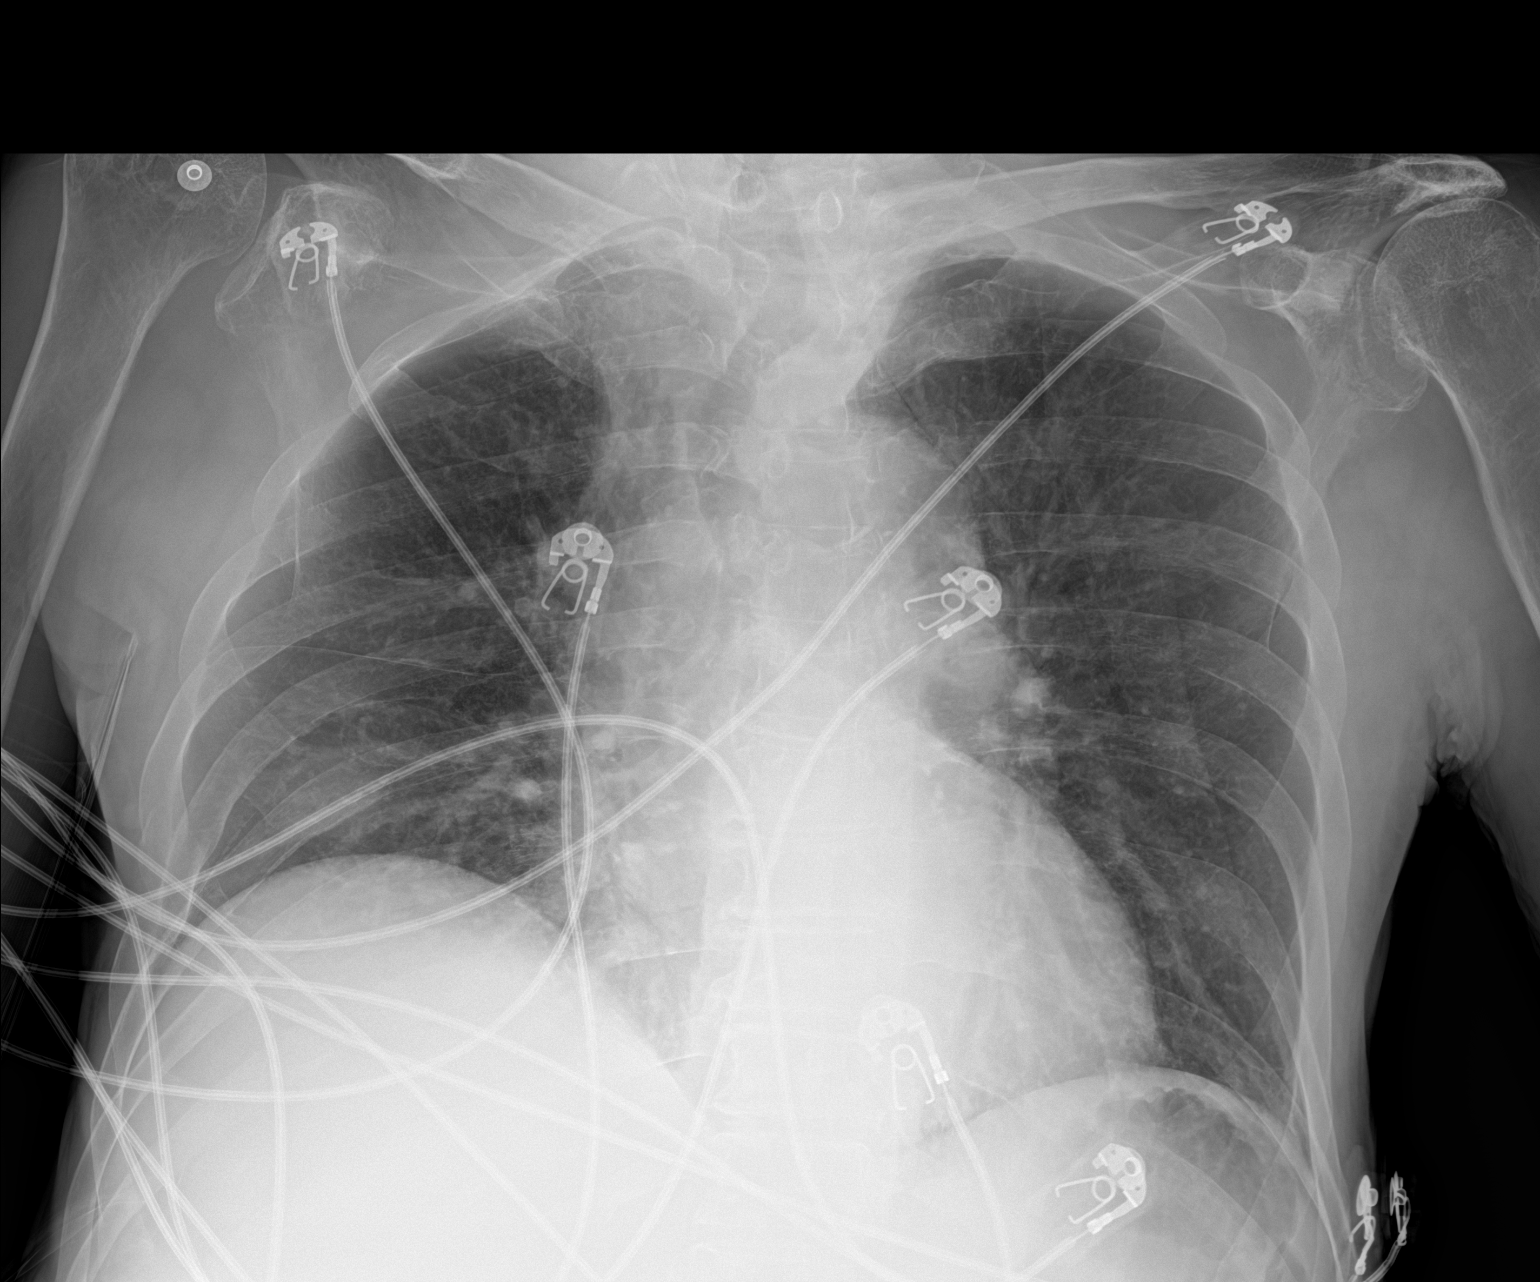

[1 of 1 positions shown; findings below may reference images not displayed]

FINDINGS: The lungs are hypoexpanded. Mild right basilar opacity may reflect
pneumonia. There is no evidence of pleural effusion or pneumothorax.

The cardiomediastinal silhouette is borderline normal in size. No
acute osseous abnormalities are seen.
IMPRESSION: Lungs hypoexpanded. Mild right basilar airspace opacity may reflect
pneumonia. This is in a similar location to the prior opacity.
Followup PA and lateral chest X-ray is recommended in 3-4 weeks
following trial of antibiotic therapy to ensure resolution and
exclude underlying malignancy.

## 2020-05-10 IMAGING — CT CT ABD-PELV W/O CM
2 of 4 series · 15 of 46 positions shown, 17 images · non-contrast
Comparison: Lumbar spine and pelvic radiographs performed
07/18/2011

CLINICAL DATA: Acute onset of severe sepsis. Renal insufficiency
and altered mental status. Fever.

EXAM:
CT ABDOMEN AND PELVIS WITHOUT CONTRAST
TECHNIQUE: Multidetector CT imaging of the abdomen and pelvis was performed
following the standard protocol without IV contrast.

[Series 3: ap without · axial · non-contrast · 0.65mm/px · z∈[+742,+1142]mm · 12 of 91 slices shown, 14 images]
[im 6/91  soft-tissue]
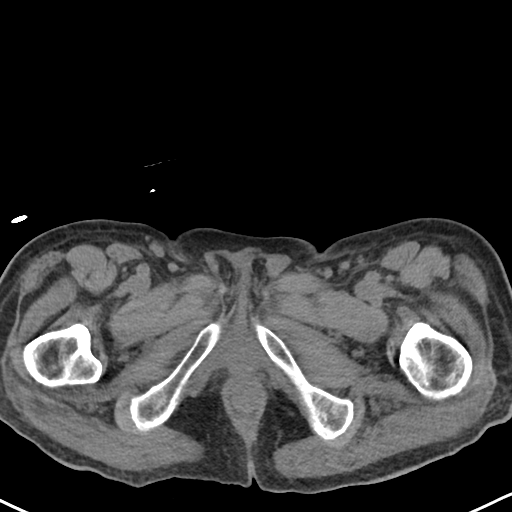
[im 6/91  bone]
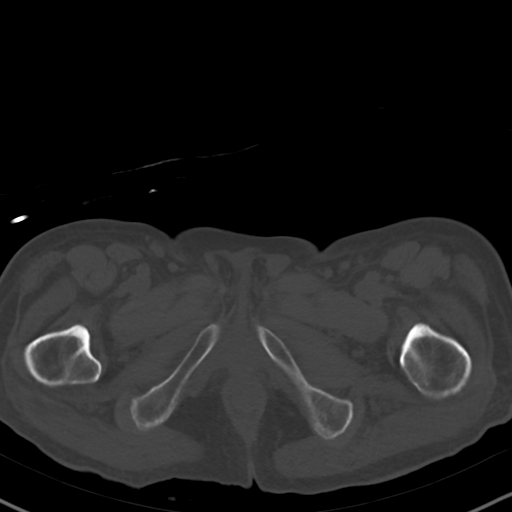
[im 16/91  soft-tissue]
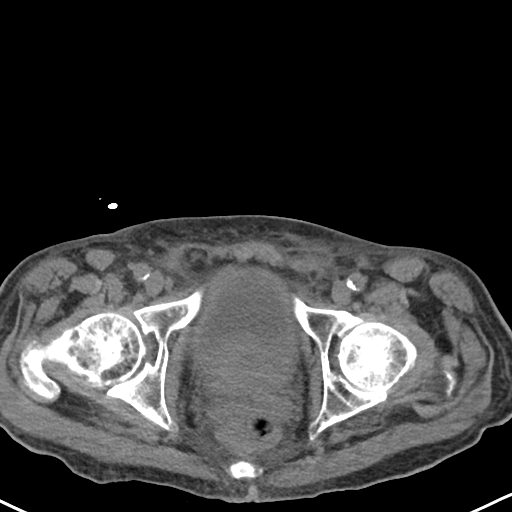
[im 21/91  soft-tissue]
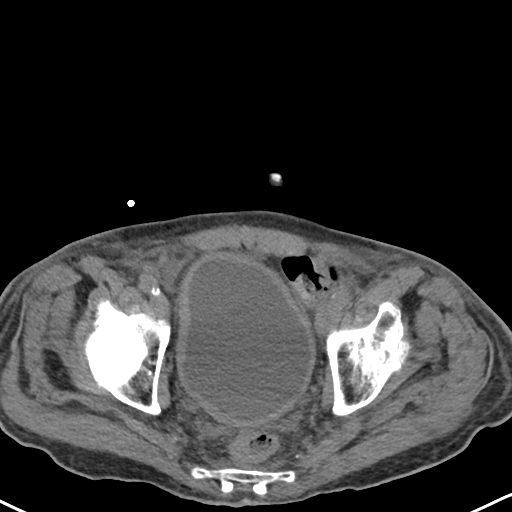
[im 26/91  soft-tissue]
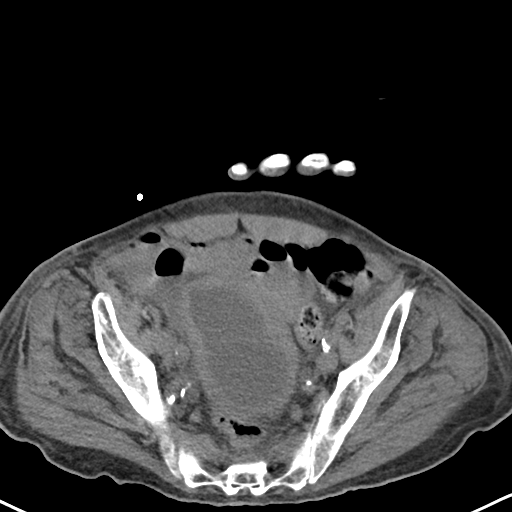
[im 36/91  soft-tissue]
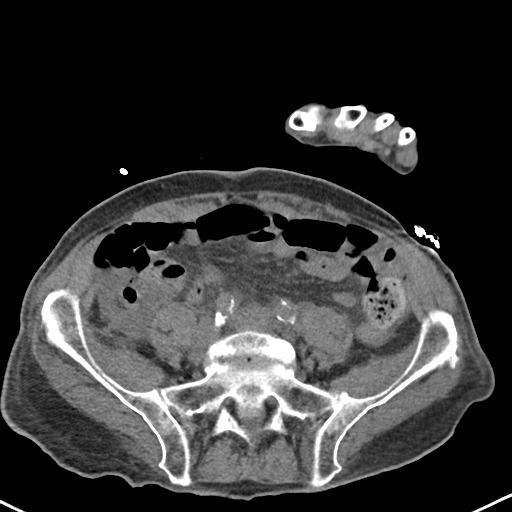
[im 41/91  soft-tissue]
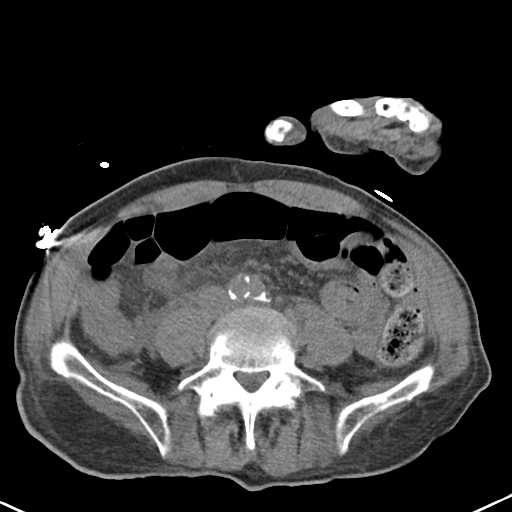
[im 51/91  soft-tissue]
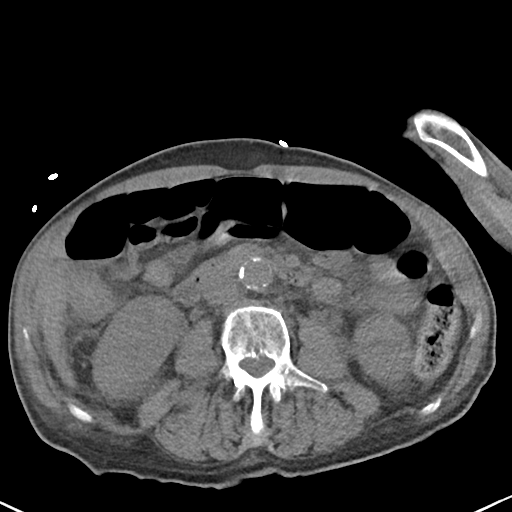
[im 56/91  soft-tissue]
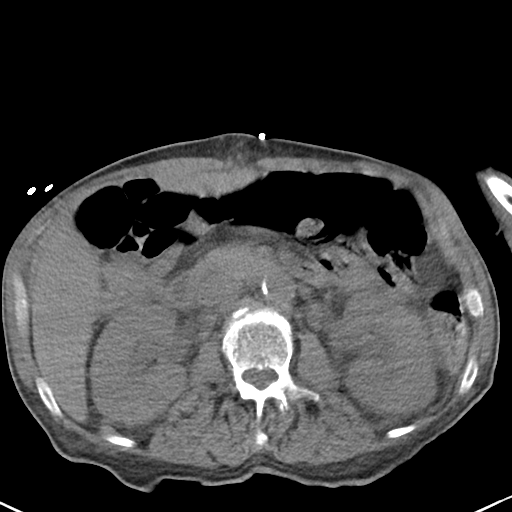
[im 66/91  soft-tissue]
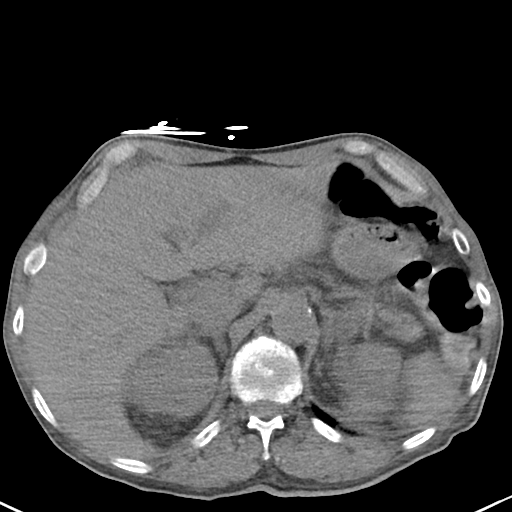
[im 66/91  bone]
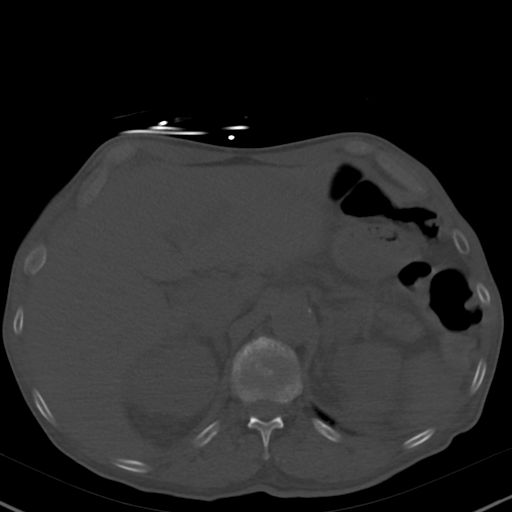
[im 71/91  soft-tissue]
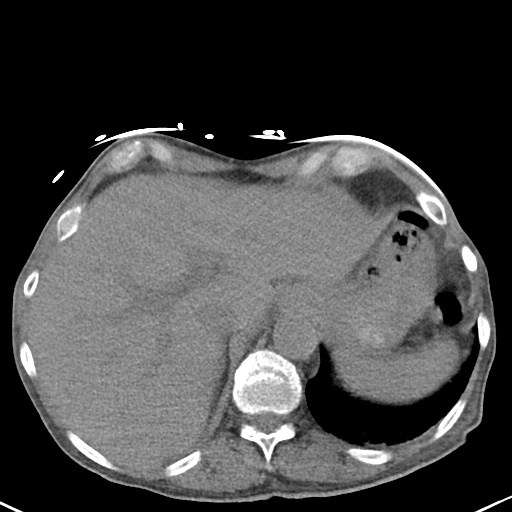
[im 76/91  soft-tissue]
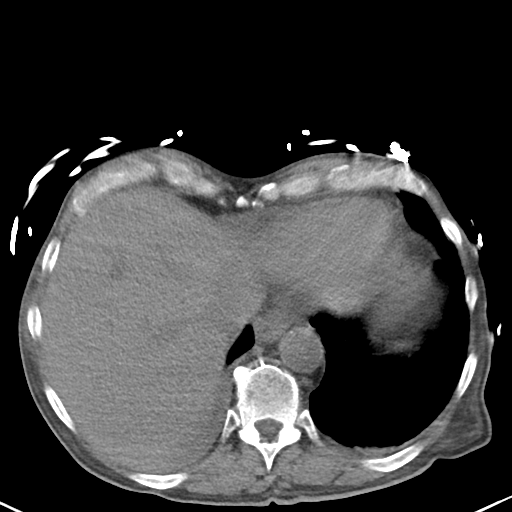
[im 86/91  soft-tissue]
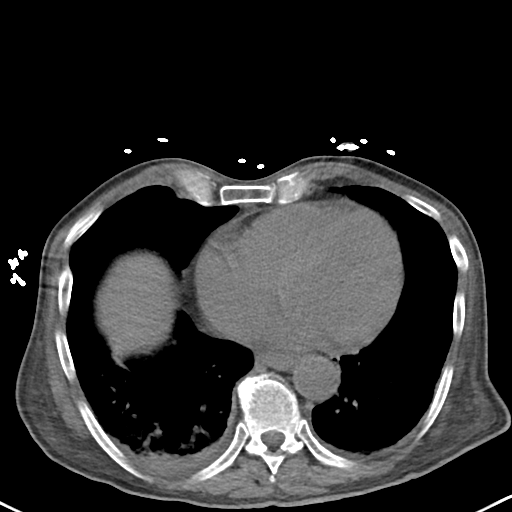

[Series 6: cor · coronal · 0.69mm/px · 3 of 80 slices shown]
[im 27/80  soft-tissue]
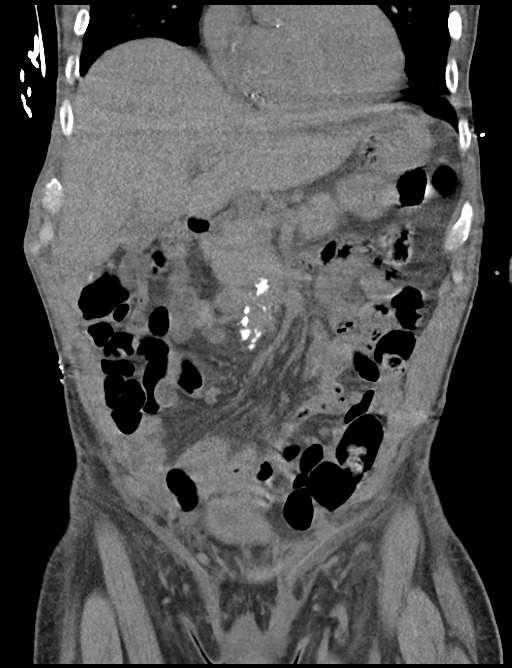
[im 36/80  soft-tissue]
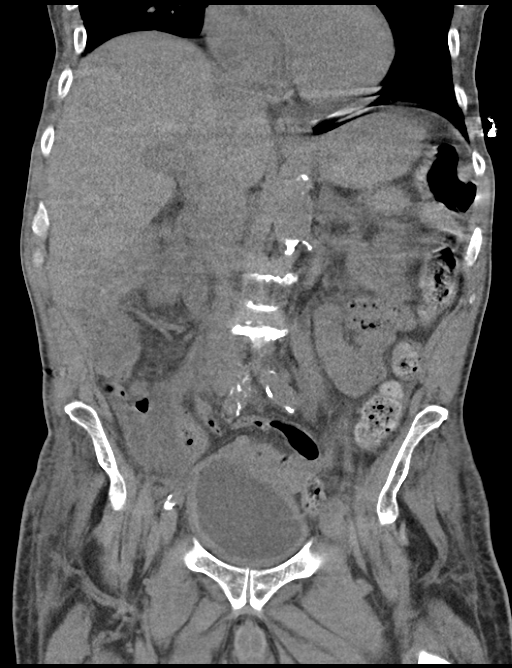
[im 44/80  soft-tissue]
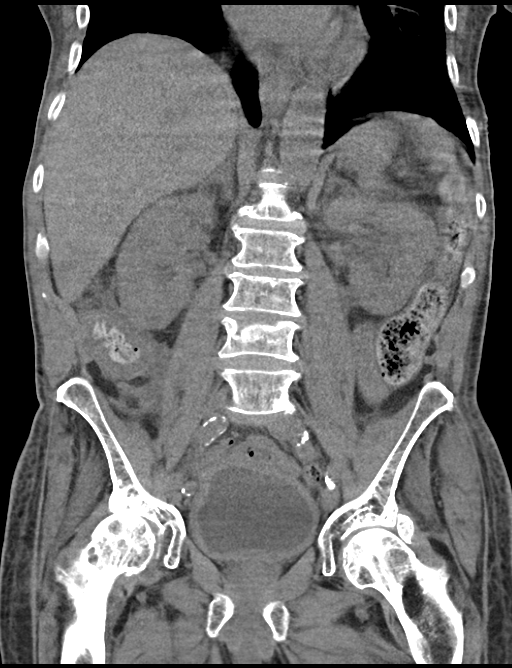

[15 of 46 positions shown; findings below may reference images not displayed]

FINDINGS: Lower chest: Mild right basilar airspace opacity may reflect
atelectasis or possibly mild pneumonia. Scattered coronary artery
calcifications are seen.

Hepatobiliary: The liver is unremarkable in appearance. The
gallbladder is unremarkable in appearance. The common bile duct
remains normal in caliber.

Pancreas: The pancreas is within normal limits.

Spleen: The spleen is unremarkable in appearance.

Adrenals/Urinary Tract: The adrenal glands are unremarkable.

Asymmetric left-sided perinephric stranding is noted. No significant
hydronephrosis is seen. Left-sided pyelonephritis cannot be
excluded. Mild fluid is noted tracking about the Gerota's fascia
bilaterally. No renal or ureteral stones are identified.

Stomach/Bowel: The stomach is unremarkable in appearance. The small
bowel is within normal limits. The appendix is normal in caliber,
without evidence of appendicitis. The colon is unremarkable in
appearance.

Fluid is seen tracking along the paracolic gutters bilaterally.

Vascular/Lymphatic: Scattered calcification is seen along the
abdominal aorta and its branches. The abdominal aorta is otherwise
grossly unremarkable. The inferior vena cava is grossly
unremarkable. No retroperitoneal lymphadenopathy is seen. No pelvic
sidewall lymphadenopathy is identified.

Reproductive: Bladder wall thickening may reflect cystitis. The
prostate remains normal in size.

Other: No additional soft tissue abnormalities are seen.

Musculoskeletal: No acute osseous abnormalities are identified.
There is chronic loss of height at vertebral body L4. The visualized
musculature is unremarkable in appearance.
IMPRESSION: 1. Mild right basilar airspace opacity may reflect atelectasis or
possibly mild pneumonia.
2. Asymmetric left-sided perinephric stranding noted. Left-sided
pyelonephritis cannot be excluded.
3. Bladder wall thickening may reflect cystitis.
4. Fluid tracks along the paracolic gutters bilaterally, of
uncertain significance.
5. Scattered coronary artery calcifications seen.

Aortic Atherosclerosis ([id number]).
